# Patient Record
Sex: Female | Born: 1965
Health system: Southern US, Community
[De-identification: ages and names within clinical notes are randomized; demographics above are authoritative.]

## PROBLEM LIST (undated history)

## (undated) DIAGNOSIS — J45990 Exercise induced bronchospasm: Secondary | ICD-10-CM

## (undated) DIAGNOSIS — E7800 Pure hypercholesterolemia, unspecified: Secondary | ICD-10-CM

## (undated) DIAGNOSIS — M79604 Pain in right leg: Secondary | ICD-10-CM

## (undated) DIAGNOSIS — D649 Anemia, unspecified: Secondary | ICD-10-CM

## (undated) DIAGNOSIS — K219 Gastro-esophageal reflux disease without esophagitis: Secondary | ICD-10-CM

## (undated) DIAGNOSIS — N979 Female infertility, unspecified: Secondary | ICD-10-CM

## (undated) DIAGNOSIS — E559 Vitamin D deficiency, unspecified: Secondary | ICD-10-CM

## (undated) DIAGNOSIS — M549 Dorsalgia, unspecified: Secondary | ICD-10-CM

## (undated) HISTORY — DX: Pure hypercholesterolemia, unspecified: E78.00

## (undated) HISTORY — DX: Vitamin D deficiency, unspecified: E55.9

## (undated) HISTORY — DX: Gastro-esophageal reflux disease without esophagitis: K21.9

## (undated) HISTORY — DX: Anemia, unspecified: D64.9

## (undated) HISTORY — DX: Pain in right leg: M79.604

## (undated) HISTORY — DX: Dorsalgia, unspecified: M54.9

## (undated) HISTORY — DX: Female infertility, unspecified: N97.9

## (undated) HISTORY — PX: LAPAROSCOPY: SHX197

---

## 1998-10-12 ENCOUNTER — Other Ambulatory Visit: Admission: RE | Admit: 1998-10-12 | Discharge: 1998-10-12 | Payer: Self-pay | Admitting: *Deleted

## 2000-01-13 ENCOUNTER — Other Ambulatory Visit: Admission: RE | Admit: 2000-01-13 | Discharge: 2000-01-13 | Payer: Self-pay | Admitting: Gynecology

## 2000-10-25 ENCOUNTER — Other Ambulatory Visit: Admission: RE | Admit: 2000-10-25 | Discharge: 2000-10-25 | Payer: Self-pay | Admitting: *Deleted

## 2001-10-22 ENCOUNTER — Other Ambulatory Visit: Admission: RE | Admit: 2001-10-22 | Discharge: 2001-10-22 | Payer: Self-pay | Admitting: Obstetrics and Gynecology

## 2001-10-22 ENCOUNTER — Other Ambulatory Visit: Admission: RE | Admit: 2001-10-22 | Discharge: 2001-10-22 | Payer: Self-pay | Admitting: *Deleted

## 2001-11-29 ENCOUNTER — Ambulatory Visit (HOSPITAL_COMMUNITY): Admission: RE | Admit: 2001-11-29 | Discharge: 2001-11-29 | Payer: Self-pay | Admitting: Obstetrics and Gynecology

## 2001-11-29 ENCOUNTER — Encounter: Payer: Self-pay | Admitting: Obstetrics and Gynecology

## 2001-12-20 ENCOUNTER — Ambulatory Visit (HOSPITAL_COMMUNITY): Admission: RE | Admit: 2001-12-20 | Discharge: 2001-12-20 | Payer: Self-pay | Admitting: Obstetrics and Gynecology

## 2001-12-20 ENCOUNTER — Encounter: Payer: Self-pay | Admitting: Obstetrics and Gynecology

## 2002-03-03 ENCOUNTER — Encounter: Payer: Self-pay | Admitting: Obstetrics and Gynecology

## 2002-03-03 ENCOUNTER — Ambulatory Visit (HOSPITAL_COMMUNITY): Admission: RE | Admit: 2002-03-03 | Discharge: 2002-03-03 | Payer: Self-pay | Admitting: Obstetrics and Gynecology

## 2002-03-21 ENCOUNTER — Ambulatory Visit (HOSPITAL_COMMUNITY): Admission: RE | Admit: 2002-03-21 | Discharge: 2002-03-21 | Payer: Self-pay | Admitting: Obstetrics and Gynecology

## 2002-03-21 ENCOUNTER — Encounter: Payer: Self-pay | Admitting: Obstetrics and Gynecology

## 2002-03-25 ENCOUNTER — Encounter: Admission: RE | Admit: 2002-03-25 | Discharge: 2002-03-25 | Payer: Self-pay | Admitting: Obstetrics and Gynecology

## 2002-04-21 ENCOUNTER — Ambulatory Visit (HOSPITAL_COMMUNITY): Admission: RE | Admit: 2002-04-21 | Discharge: 2002-04-21 | Payer: Self-pay | Admitting: Obstetrics and Gynecology

## 2002-04-21 ENCOUNTER — Encounter: Payer: Self-pay | Admitting: Obstetrics and Gynecology

## 2002-05-05 ENCOUNTER — Ambulatory Visit (HOSPITAL_COMMUNITY): Admission: RE | Admit: 2002-05-05 | Discharge: 2002-05-05 | Payer: Self-pay | Admitting: Obstetrics and Gynecology

## 2002-05-05 ENCOUNTER — Encounter: Payer: Self-pay | Admitting: Obstetrics and Gynecology

## 2002-05-12 ENCOUNTER — Inpatient Hospital Stay (HOSPITAL_COMMUNITY): Admission: AD | Admit: 2002-05-12 | Discharge: 2002-05-12 | Payer: Self-pay | Admitting: Obstetrics and Gynecology

## 2002-05-15 ENCOUNTER — Inpatient Hospital Stay (HOSPITAL_COMMUNITY): Admission: AD | Admit: 2002-05-15 | Discharge: 2002-05-19 | Payer: Self-pay | Admitting: Obstetrics and Gynecology

## 2002-10-02 ENCOUNTER — Other Ambulatory Visit: Admission: RE | Admit: 2002-10-02 | Discharge: 2002-10-02 | Payer: Self-pay | Admitting: Obstetrics and Gynecology

## 2002-12-05 ENCOUNTER — Emergency Department (HOSPITAL_COMMUNITY): Admission: AD | Admit: 2002-12-05 | Discharge: 2002-12-05 | Payer: Self-pay | Admitting: Family Medicine

## 2003-11-04 ENCOUNTER — Other Ambulatory Visit: Admission: RE | Admit: 2003-11-04 | Discharge: 2003-11-04 | Payer: Self-pay | Admitting: Obstetrics and Gynecology

## 2004-02-08 ENCOUNTER — Inpatient Hospital Stay (HOSPITAL_COMMUNITY): Admission: AD | Admit: 2004-02-08 | Discharge: 2004-02-08 | Payer: Self-pay | Admitting: Obstetrics and Gynecology

## 2004-03-29 ENCOUNTER — Ambulatory Visit (HOSPITAL_COMMUNITY): Admission: RE | Admit: 2004-03-29 | Discharge: 2004-03-29 | Payer: Self-pay | Admitting: Obstetrics and Gynecology

## 2004-04-18 ENCOUNTER — Ambulatory Visit (HOSPITAL_COMMUNITY): Admission: RE | Admit: 2004-04-18 | Discharge: 2004-04-18 | Payer: Self-pay | Admitting: Obstetrics and Gynecology

## 2004-05-24 ENCOUNTER — Ambulatory Visit (HOSPITAL_COMMUNITY): Admission: RE | Admit: 2004-05-24 | Discharge: 2004-05-24 | Payer: Self-pay | Admitting: Obstetrics and Gynecology

## 2004-07-11 ENCOUNTER — Ambulatory Visit (HOSPITAL_COMMUNITY): Admission: RE | Admit: 2004-07-11 | Discharge: 2004-07-11 | Payer: Self-pay | Admitting: Obstetrics and Gynecology

## 2004-08-03 ENCOUNTER — Ambulatory Visit (HOSPITAL_COMMUNITY): Admission: RE | Admit: 2004-08-03 | Discharge: 2004-08-03 | Payer: Self-pay | Admitting: Obstetrics and Gynecology

## 2004-08-03 ENCOUNTER — Inpatient Hospital Stay (HOSPITAL_COMMUNITY): Admission: AD | Admit: 2004-08-03 | Discharge: 2004-08-08 | Payer: Self-pay | Admitting: Obstetrics and Gynecology

## 2004-08-05 ENCOUNTER — Encounter (INDEPENDENT_AMBULATORY_CARE_PROVIDER_SITE_OTHER): Payer: Self-pay | Admitting: Specialist

## 2005-02-01 ENCOUNTER — Other Ambulatory Visit: Admission: RE | Admit: 2005-02-01 | Discharge: 2005-02-01 | Payer: Self-pay | Admitting: Obstetrics and Gynecology

## 2005-05-17 ENCOUNTER — Encounter: Admission: RE | Admit: 2005-05-17 | Discharge: 2005-05-17 | Payer: Self-pay | Admitting: Obstetrics and Gynecology

## 2005-06-05 ENCOUNTER — Encounter: Admission: RE | Admit: 2005-06-05 | Discharge: 2005-06-05 | Payer: Self-pay | Admitting: Obstetrics and Gynecology

## 2005-09-27 ENCOUNTER — Emergency Department (HOSPITAL_COMMUNITY): Admission: EM | Admit: 2005-09-27 | Discharge: 2005-09-27 | Payer: Self-pay | Admitting: Emergency Medicine

## 2006-06-13 ENCOUNTER — Other Ambulatory Visit: Admission: RE | Admit: 2006-06-13 | Discharge: 2006-06-13 | Payer: Self-pay | Admitting: *Deleted

## 2006-11-07 ENCOUNTER — Encounter: Admission: RE | Admit: 2006-11-07 | Discharge: 2006-11-07 | Payer: Self-pay | Admitting: Obstetrics and Gynecology

## 2007-05-02 ENCOUNTER — Encounter: Admission: RE | Admit: 2007-05-02 | Discharge: 2007-05-02 | Payer: Self-pay | Admitting: *Deleted

## 2007-11-21 ENCOUNTER — Encounter: Admission: RE | Admit: 2007-11-21 | Discharge: 2007-11-21 | Payer: Self-pay | Admitting: Family Medicine

## 2009-01-01 ENCOUNTER — Encounter: Admission: RE | Admit: 2009-01-01 | Discharge: 2009-01-01 | Payer: Self-pay | Admitting: Obstetrics and Gynecology

## 2009-05-07 ENCOUNTER — Ambulatory Visit: Payer: Self-pay | Admitting: Sports Medicine

## 2009-05-07 DIAGNOSIS — M7711 Lateral epicondylitis, right elbow: Secondary | ICD-10-CM | POA: Insufficient documentation

## 2009-05-07 DIAGNOSIS — M25529 Pain in unspecified elbow: Secondary | ICD-10-CM | POA: Insufficient documentation

## 2009-06-07 ENCOUNTER — Ambulatory Visit: Payer: Self-pay | Admitting: Sports Medicine

## 2010-01-25 ENCOUNTER — Encounter
Admission: RE | Admit: 2010-01-25 | Discharge: 2010-01-25 | Payer: Self-pay | Source: Home / Self Care | Attending: Family Medicine | Admitting: Family Medicine

## 2010-02-13 ENCOUNTER — Encounter: Payer: Self-pay | Admitting: Obstetrics and Gynecology

## 2010-02-22 NOTE — Assessment & Plan Note (Signed)
Summary: NP,TENNIS ELBOW,MC   Vital Signs:  Patient profile:   45 year old female Height:      69 inches Weight:      190 pounds BMI:     28.16 BP sitting:   117 / 82  Vitals Entered By: Lillia Pauls CMA (May 07, 2009 10:29 AM)  History of Present Illness: Sharon [name] was playing a lot of aggressive tennis playing sometimes as much as 5x per wk Rt elbow painful 6 wks ago  rested 1 weeks and had racquet change 3 wks later came back and pain became more severe has not played since then no numbness initially hurt to do any lifting now less so  Allergies (verified): No Known Drug Allergies  Physical Exam  General:  Well-developed,well-nourished,in no acute distress; alert,appropriate and cooperative throughout examination Msk:  RT lat epidcondyle shows direct tenderness near tip testingof ERCB is very painful testing of gen wrist ext mildy painful supination mildly painful holding book str out is mildly painful no swelling or discoloration nl ROM  MSK [place] small avul fx at tip small tear under ext mm no neovessels some edema in ext mm group  images saved   Impression & Recommendations:  Problem # 1:  ELBOW PAIN, RIGHT (ICD-719.42)  Orders: Pneumatic Arm Band ([id number]) [place] LIMITED ([id number])  begin rehab exerc see isnt icing brace  Problem # 2:  LATERAL EPICONDYLITIS, RIGHT (ICD-726.32)  This is clearly demonstrated on [place] With small tear will start on NTG protocol  reck monthly until healed  Orders: [place] LIMITED ([id number])  Complete Medication List: 1)  Nitroglycerin 0.2 Mg/hr Pt24 (Nitroglycerin) .... 1/4 patch daily as directed to rt elbow  Patient Instructions: 1)  use patches daily 2)  do quick ice massage after playing - for 5 mins 3)  exercises 4)  wrost curls - down slow / up quick 5)  wrist rolls over slow/ back quick 6)  3 sets of 15 w 1 lb 7)  do several times a day 8)  OK to hit - at first 2 weeks only shots that don't hurt 9)  use the elbow strap  - does not have to be tight at least first 2 weeks 10)  reck 1 month Prescriptions: NITROGLYCERIN 0.2 MG/HR PT24 (NITROGLYCERIN) 1/4 patch daily as directed to RT elbow  #30 x 2   Entered and Authorized by:   Enid Baas MD   Signed by:   Enid Baas MD on 05/07/2009   Method used:   Electronically to        [facility] [phone]* (retail)       [address]       [place], [place]  [address]       Ph: [phone] or [phone]       Fax: [phone]   RxID:   [id number]

## 2010-02-22 NOTE — Assessment & Plan Note (Signed)
Summary: f/u,mc   Vital Signs:  Patient profile:   45 year old female BP sitting:   110 / 67  Vitals Entered By: Lillia Pauls CMA (Jun 07, 2009 10:13 AM)  History of Present Illness: Following all instructions as outlined  feels elbow is 85 to 90% better went back to playing social tennis wk to 10 days p NTG after 1 wk of that able to play competitive wout any pain while playing  no sideeffects from NTG  here for REck  Allergies: No Known Drug Allergies  Physical Exam  General:  Well-developed,well-nourished,in no acute distress; alert,appropriate and cooperative throughout examination Msk:  minimal TTP at tip of epicoindyle on RT no real pain w resited ext of fingers 2,3 and 4 no pain w resisted elbow extension full supination adn pronation x resistance w no pain  able to hold book in full extension   Impression & Recommendations:  Problem # 1:  ELBOW PAIN, RIGHT (ICD-719.42)  minimal pain now  some tightness and mild soreness after playing hard but does not last  Orders: [place] LIMITED ([id number])  Problem # 2:  LATERAL EPICONDYLITIS, RIGHT (ICD-726.32)  clinically this is nearly resolved  on scanning the gaps and edema have nearly resolved as well  finish full 3 mos protocol on NTG  keep up exercies  use strap until fall  REck 8 wks  Orders: [place] LIMITED ([id number])  Complete Medication List: 1)  Nitroglycerin 0.2 Mg/hr Pt24 (Nitroglycerin) .... 1/4 patch daily as directed to rt elbow

## 2010-06-10 NOTE — Discharge Summary (Signed)
Sharon, [name]                 ACCOUNT NO.:  [id number]   MEDICAL RECORD NO.:  [id number]           PATIENT TYPE:   LOCATION:  9119                           FACILITY:   PHYSICIAN:  Janine Limbo, M.D.DATE OF BIRTH:  08/21/1965   DATE OF ADMISSION:  08/03/2004  DATE OF DISCHARGE:  08/08/2004                                 DISCHARGE SUMMARY   ADMISSION DIAGNOSES:  1.  Intrauterine twin pregnancy at 37 weeks.  2.  Discordant growth.  3.  Early labor.  4.  Unfavorable cervix.   DISCHARGE DIAGNOSES:  1.  Twin gestation at 11 weeks.  2.  Discordant growth.  3.  Failure to progress in labor.  4.  Anemia without hemodynamic instability.   PROCEDURES:  1.  Primary low transverse cesarean section.  2.  Epidural anesthesia.   HOSPITAL COURSE:  Sharon [name] is a 45 year old, gravida 2, para 1 at 61 weeks  with twin gestation who was admitted on 08/03/2004 for evaluation of  discordant growth on ultrasound.  The patient was also noted to be  contracting.  The first baby was vertex.  The second baby was transverse.  The decision was made to offer the patient a C section for delivery.  The  patient opted for a trial of labor.  Low dose Pitocin was begun.  The  patient's pregnancy had been remarkable for:  1. Intrauterine twin pregnancy  at 37 weeks.  2. Discordant growth with Twin B.  3. Unfavorable cervix.  4.  History of insulin-dependent gestational diabetes mellitus.  5. In vitro  fertilization pregnancy.  6. Advanced maternal age.  7. Group B strep  negative.  Pitocin was begun and artificial rupture of membranes was  accomplished with clear fluid noted.  Epidural was placed.  The patient  progressed to 7-8 cm, 90% vertex at a 0 to +1 station.  Over a greater than  two hours period, she did not progress past 7 cm.  The decision was made to  proceed with cesarean section.  This was performed by Maris Berger. Haygood,  M.D. under epidural anesthesia.  The findings were viable  twin gestation.  Baby A was Sharon [name], weight 6 pounds 2 ounces.  Apgar's were 8 and 9.  Baby B  was Sharon [name], weight 5 pounds 6 ounces, Apgar's were 8 and 9.  There was a  small fibroid noted.  The infants were taken to the Full Term Nursery.  The  mother was taken to the recovery room in good condition.  Estimated blood  loss was 1250 cubic centimeters.  By postoperative day #1, the patient was  doing well.  She denied syncope or any dizziness.  She was breastfeeding.  She was having some significant itching.  Her orthostatic's were stable.  Her physical exam was within normal limits.  She did have 1+ edema in her  lower extremities.  Hemoglobin was 8.5, down from 10.5, white blood cell  count 12, and platelet count was 133.  The patient was placed on iron.  The  patient was working on bottle feeding and breast feeding through  the rest of  her hospital stay.  By postoperative day #3, her Jackson-Pratt drain had  been removed on postoperative day #2 without any difficulty after minimal  drainage.  By postoperative day #3, she was doing well.  She did have some  slight upper abdominal fullness but was passing gas and did have positive  bowel sounds.  Her incision was clean, dry, and intact with subcuticular  sutures.  She was up ad lib without any difficulty.  She was deemed to  receive full benefit from her hospital stay and was discharged home.   CONDITION ON DISCHARGE:  Stable.   DISCHARGE INSTRUCTIONS:  Per [facility] handout.   DISCHARGE MEDICATIONS:  Motrin 600 mg p.o. q.6h. p.r.n. pain, Tylox 1-2 p.o.  q.3-4h. p.r.n. pain, Citracal, prenatal vitamin one p.o. q. Day.  The  patient will also continue Feosol at home one p.o. b.i.d.   DISCHARGE FOLLOW UP:  Will occur in six weeks at [facility] or  p.r.n.   Dictated by Renaldo Reel. Emilee Hero, C.N.M. for Janine Limbo, M.D.       VLL/MEDQ  D:  08/08/2004  T:  08/08/2004  Job:  [id number]

## 2010-06-10 NOTE — H&P (Signed)
NAMEANDERSEN, MCKIVER                             ACCOUNT NO.:  [id number]   MEDICAL RECORD NO.:  [id number]                   PATIENT TYPE:   LOCATION:                                       [facility]   PHYSICIAN:  Janine Limbo, M.D.            DATE OF BIRTH:  02-21-1965   DATE OF ADMISSION:  05/15/2002  DATE OF DISCHARGE:                                HISTORY & PHYSICAL   HISTORY OF PRESENT ILLNESS:  The patient is a 45 year old gravida 1 para 0  at 74 and four-sevenths weeks who presents this evening for induction  secondary to insulin-dependent gestational diabetes and possible LGA.  Present pregnancy has been remarkable for:  1. History of infertility with patient's conception with IVF.  2. Advanced maternal age with amniocentesis within normal limits.  3. Gestational diabetes, insulin dependent.  4. History of asthma.  5. Initial twin gestation with loss of one twin in the first trimester.   PRENATAL LABORATORY DATA:  Blood type O positive, Rh antibody negative.  VDRL nonreactive.  Rubella titer positive.  Hepatitis B surface antigen  negative.  Sickle cell test negative.  Cystic fibrosis testing negative.  Pap normal.  Glucose challenge was elevated.  Hemoglobin upon entering the  practices was 12.7; it was 12.4 at 29 weeks.  Group B strep culture was  negative at 36 weeks.  EDC of May 18, 2002 was established by embryo  transfer date.   HISTORY OF PRESENT PREGNANCY:  The patient entered care at approximately 10  weeks.  She had had in vitro fertilization to accomplish pregnancy.  At her  first visit she had an ultrasound secondary to no fetal heart tones.  There  was one viable twin noted and one nonviable twin noted.  The patient  interested in amniocentesis.  This was done on November 7, without  complication.  Findings were a viable female.  She had another ultrasound at  approximately 18 weeks which showed normal growth and development.  She did  have some  reflux and back pain during her pregnancy.  She also was on  prednisone at approximately 25 weeks for her asthma.  She was placed on a Z  pack and Tussionex at 28 weeks for an upper respiratory infection.  She had  another ultrasound at approximately 29 weeks that showed normal growth and  development.  She had an elevated one-hour GTT and an abnormal three-hour  GTT.  This was following a prednisone course.  The patient remained with  fasting blood sugars in the 98-111 range and occasional two-hour PCs greater  than 120.  She was placed on Humulin at 34 weeks, primarily 10 units NPH at  h.s.  She began NSTs twice weekly.  She had an ultrasound at 31 weeks that  showed estimated fetal weight greater than the 95th percentile.  Humulin was  increased to 12 units NPH at  35 weeks secondary to elevated two-hour PCs.  This was increased to 14 units at 38 weeks.  She had another ultrasound at  38 weeks which showed growth in the greater than the 90th percentile.  Induction was scheduled for April 22.  She had reactive NSTs twice weekly.  She did have one spontaneous CST on April 19 in response to a questionable  deceleration associated with contraction at the office.  Options of  C section versus induction secondary to elevated fetal weight were reviewed  with the patient by Dr. Pennie Rushing.  The patient at that point did not wish to  proceed with C section.  On ultrasound at 36 weeks she had estimated fetal  weight in the 75th to 90th percentile with low-normal fluid.  Sugars  stabilized and the patient remained on 14 units NPH at bedtime.  The patient  was then reiterated to be scheduled for induction on April 22 in the  evening.  Her cervix has been closed and 75%.   OBSTETRICAL HISTORY:  The patient is a primigravida.   MEDICAL HISTORY:  The patient has been evaluated and treated for infertility  since 1999.  Conception did occur by in vitro fertilization with this  pregnancy.  The rest of her  medical history is unremarkable except for  exercise-induced asthma for which she occasionally uses Advair.  Surgical  history includes laparoscopy in the past for workup of infertility, and  wisdom teeth removal.  She reports the usual childhood illnesses.   ALLERGIES:  No known medication allergies.   FAMILY HISTORY:  Her sister had childhood anemia.  Her cousin had a brain  aneurysm.  Maternal grandmother had insulin-dependent diabetes.  Paternal  aunt had non-insulin-dependent diabetes.  Paternal grandfather had some type  of cancer.  Paternal grandmother had a stroke.   GENETIC HISTORY:  Remarkable for the patient being 36 at the time of  delivery.  Her cousin also had an extra digit.  The father-of-the-baby's  cousin had Downs syndrome.   SOCIAL HISTORY:  The patient is married to the father of the baby.  He is  involved and supportive.  His name is Lucill Mauck.  The patient is  African-American, of the [address] faith.  She has been followed by the  physician service at [facility].  She denies any alcohol, drug, or  tobacco use during this pregnancy.  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  Her husband is a Development worker, community.   PHYSICAL EXAMINATION:  VITAL SIGNS:  Stable; the patient is afebrile.  HEENT:  Within normal limits.  LUNGS:  Bilateral breath sounds are clear.  HEART:  Regular rate and rhythm without murmur.  BREASTS:  Soft and nontender.  ABDOMEN:  Fundal height is approximately 38 cm.  Estimated fetal weight is 8  pounds.  Uterine contractions have been occasional and mild.  PELVIC:  Cervical exam on last check was closed, 75%, -1 to -2.  MONITOR DATA:  Fetal heart rate is reassuring.  She had an NST today in the  office.  EXTREMITIES:  Deep tendon reflexes are 2+ without clonus.  There is a trace  edema noted.   IMPRESSION:  1. Intrauterine pregnancy at 25 and four-sevenths weeks. 2. Insulin-dependent gestational diabetes.  3. Advanced maternal  age with normal amniocentesis.  4. Infertility with in vitro fertilization.  5. Asthma.   PLAN:  1. Admit to birthing suite per consult with Dr. Marline Backbone as attending     physician.  2. Routine physician orders.  3. Dr. Stefano Gaul will determine mode of management of her gestational     diabetes which may well include the Glucomander and close observation of     CBGs.     Renaldo Reel Emilee Hero, C.N.M.                   Janine Limbo, M.D.    VLL/MEDQ  D:  05/15/2002  T:  05/15/2002  Job:  [id number]

## 2010-06-10 NOTE — H&P (Signed)
   Sharon [name], Sharon [name]                             ACCOUNT NO.:  [id number]   MEDICAL RECORD NO.:  [id number]                   PATIENT TYPE:   LOCATION:                                       [facility]   PHYSICIAN:  Janine Limbo, M.D.            DATE OF BIRTH:  08-Jun-1965   DATE OF ADMISSION:  05/15/2002  DATE OF DISCHARGE:                                HISTORY & PHYSICAL   HISTORY OF PRESENT ILLNESS:  The patient is a 45 year old gravida 1 para 0  at 76 and four-sevenths week who presents this evening for induction  secondary to insulin-dependent gestational diabetes.  Her pregnancy has been  remarkable for:  1. Infertility with conception by in vitro fertilization.  2. Advanced maternal age with   Dictation ended at this point     Chip Boer L. Emilee Hero, C.N.M.                   Janine Limbo, M.D.    VLL/MEDQ  D:  05/15/2002  T:  05/15/2002  Job:  [id number]

## 2010-06-10 NOTE — H&P (Signed)
Sharon [name], Sharon [name]                 ACCOUNT NO.:  [id number]   MEDICAL RECORD NO.:  [id number]          PATIENT TYPE:  INP   LOCATION:                                [facility]:  [facility]   PHYSICIAN:  Hal Morales, M.D.DATE OF BIRTH:  01/17/1966   DATE OF ADMISSION:  08/03/2004  DATE OF DISCHARGE:                                HISTORY & PHYSICAL   HISTORY OF PRESENT ILLNESS:  Ms. Sharon [name] is a 45 year old married African-  American female, gravida 2, para 1-0-0-1 at [redacted] weeks gestation with a twin  pregnancy for evaluation of discordant growth on ultrasound by non-stress  test, and it was then noted patient was contracting every 3-5 minutes.  She  denies leaking or bleeding.  Reports positive fetal movement.  Denies signs  and symptoms of PIH.  Her pregnancy has been followed by the [facility] OB/GYN M.D. service, and has been remarkable for (1) twins, (2)  history of insulin-dependent gestational diabetes, (3) this pregnancy was  conceived by in vitro fertilization, (4) advanced maternal age, (5) group B  strep negative.   PRENATAL LABORATORIES:  Her prenatal labs were collected on February 01, 2004.  Hemoglobin 12.1, hematocrit 36.7, platelets 239,000.  Blood type O positive.  Antibody negative.  Sickle cell trait negative.  RPR nonreactive.  Rubella  titer is positive.  Hepatitis B surface antigen negative.  HIV negative from  October of 2005.  Pap smear within normal limits in October 2005.  Gonorrhea  and Chlamydia negative in October 2005.  Hemoglobin A1C was 5.7.  One hour  Glucola from May 25, 2003 was 106, and RPR at that time was nonreactive.  A  culture of the vaginal tract for group B strep was negative.   HISTORY OF PRESENT ILLNESS:  The patient presented for care at [facility] on February 01, 2004 at 10 and 3/7ths weeks gestation with a twin  pregnancy that was conceived through in vitro fertilization.  The best [facility]  was determined to be August 26, 2004.  The  patient opted for first trimester  screening at [facility] which gave twin A increased Down syndrome risk  of 1 in 37, and twin B increased Down syndrome risk of 1 in 7.  Amniocentesis was recommended to the patient.  She proceeded with anatomy  ultrasound at [redacted] weeks gestation that showed no soft markers for Down  syndrome, and she declined the amniocentesis and the serum AFP.  Pregnancy  ultrasonography at [redacted] weeks gestation showed normal growth and normal fluid  for both twins.  EFW was in the 50th to 75th percentile.  The patient was  treated for epigastric pain with Protonix at [redacted] weeks gestation.  Ultrasonography at [redacted] weeks gestation showed normal growth and normal fluid,  cervix of 4.4 cm.  Twin A with pyelectasis.  Ultrasonography at [redacted] weeks  gestation showed normal growth and normal fluid for both twins, cervix of  3.6 cm.  The rest of her pregnancy was unremarkable.   OBSTETRICAL HISTORY:  She is a gravida 2, para 1-0-0-1.  In  April of 2004,  the patient had a vaginal delivery of a female infant weighing 7 pounds, 12  ounces, at 61 and 2/7ths weeks gestation after 12 hours of labor.  She had  an epidural for anesthesia.  The infant's name was Sharon [name].  The patient had  insulin-dependent gestational diabetes mellitus with that pregnancy.   MEDICAL HISTORY:  1.  She has no medication allergies.  2.  She experienced menarche at the age of 65 with 22-24 day cycles, lasting      4-5 days.  3.  She reports having had the usual childhood illnesses.  4.  She has a history of asthma.   SURGICAL HISTORY:  Negative.   FAMILY MEDICAL HISTORY:  Negative.   GENETIC HISTORY:  Remarkable for the patient's cousin with polydactyly, and  father of the baby's cousin with Down syndrome.  The patient is age 56 at  the time of delivery.   SOCIAL HISTORY:  The patient is married to [place]  He is from [place].  He  is involved and supportive.  They are of the [facility] faith.  The patient  has  16 years of education, and is a full-time homemaker.  Father of the baby is  employed full time as a Proofreader.  They deny any tobacco smoking or  illicit drug use with the pregnancy.   OBJECTIVE DATA:  VITAL SIGNS:  Stable.  She is afebrile.  HEENT:  Grossly within normal limits.  CHEST:  Clear to auscultation.  HEART:  Regular rate and rhythm.  ABDOMEN:  Gravid in contour.  Fetal heart rates are reassuring with positive  accelerations, and negative CST x2.  Contractions every 3-5 minutes, which  are mild.  PELVIC:  Cervix is 1 cm, 50%, vertex -2, and posterior.  EXTREMITIES:  Remarkable for 1+ edema.  Ultrasonography shows the first baby  is vertex.  Twin B is transverse with vertex in the left lower quadrant.  Estimated fetal weight of A is 3400 gm.  Estimated fetal weight of B is 2700  gm.   ASSESSMENT:  1.  Intrauterine pregnancy at 37 weeks with twins.  2.  Twin discordant growth.  3.  Unfavorable cervix.   PLAN:  1.  Admit the patient to birthing suites per Dr. Pennie Rushing.  2.  Routine M.D. orders.  3.  The patient was given the option of cesarean section for delivery, but      opts for a trial at vaginal delivery, and understands that twin B may      require cesarean section for delivery.  4.  Will plan low dose Pitocin for cervical ripening, and Pitocin induction      in the morning.       KS/MEDQ  D:  08/04/2004  T:  08/04/2004  Job:  [id number]

## 2010-06-10 NOTE — Op Note (Signed)
NAMECHRISTEENA, KROGH                 ACCOUNT NO.:  [id number]   MEDICAL RECORD NO.:  [id number]          PATIENT TYPE:  INP   LOCATION:  9119                          FACILITY:  [facility]   PHYSICIAN:  Hal Morales, M.D.DATE OF BIRTH:  1965/07/31   DATE OF PROCEDURE:  08/05/2004  DATE OF DISCHARGE:                                 OPERATIVE REPORT   PREOPERATIVE DIAGNOSES:  1.  Twin gestation at 56 weeks.  2.  Discordant twin growth.  3.  Failure to progress in labor.   POSTOPERATIVE DIAGNOSES:  1.  Twin gestation at 7 weeks.  2.  Discordant twin growth.  3.  Failure to progress in labor.   OPERATION:  Primary low transverse cesarean section.   SURGEON:  Hal Morales, M.D.   FIRST ASSISTANT:  Erin Sons, C.N.M.   ANESTHESIA:  Epidural.   ESTIMATED BLOOD LOSS:  1250 mL.   COMPLICATIONS:  None.   FINDINGS:  The patient was delivered of two female infants.  Baby A, [name],  seven 6 pounds 2 ounces, with Apgars of 8 and 9 at one and minutes,  respectively.  Baby B, [name], weighed 5 pounds 6 ounces, with Apgars of 8  and 9 at one and five minutes, respectively.  The uterus contained a small  uterine fibroid.  The ovaries and tubes appeared within normal limits for  the gravid state.   PROCEDURE:  The patient was taken to the operating room after appropriate  identification and after a discussion concerning the risks of cesarean  section versus the benefits.  A discussion was held concerning the risks of  anesthesia, bleeding, infection and damage to adjacent organs.  The patient  gave consent to proceed.  She was placed on the operating table and the  labor epidural block to the level for surgical anesthesia.  Her Foley  catheter was already in place.  The abdomen was prepped with multiple layers  of Betadine and draped as a sterile field.  The suprapubic region was  infiltrated with 20 mL of 0.25% Marcaine.  A suprapubic incision was made in  the abdomen opened in  layers.  The peritoneum was entered and the bladder  blade placed.  The uterus was incised approximately 2 cm above the  uterovesical fold and that incision taken laterally on either side.  Baby A  was delivered from the occiput transverse position with the nares and  pharynx being suctioned and the cord being clamped and cut.  She was handed  off to the awaiting pediatricians.  The second infant was delivered as a  footling breech and after having the nares and pharynx suctioned and the  cord clamped and cut was handed off to the awaiting pediatricians.  The  appropriate cord blood was drawn including cord blood for banking, which the  patient had requested.  The placenta was noted to have separated from the  uterus and was removed from the operative field and sent to pathology.  Uterine incision was closed with a running interlocking suture of 0 Vicryl.  An imbricating suture of 0 Vicryl  was then placed.  Several sutures of 0  Vicryl in a figure-of-eight fashion were used to achieve adequate  hemostasis.  Copious irrigation was carried out and a single suture used to  reapproximate the bladder flap.  The abdominal peritoneum was closed with a  running suture of 2-0 Vicryl.  The rectus muscles were reapproximated in the  midline with a figure-of-eight suture of 2-0 Vicryl.  The rectus muscles  were made hemostatic with Bovie cautery and irrigated.  The rectus fascia  was closed with a running suture of 0 Vicryl, then reinforced on either side  of midline with figure-of-eight sutures of 0 Vicryl.  The subcutaneous  tissue was copiously irrigated and made hemostatic with Bovie cautery.  A 7  mm Jackson-Pratt drain was placed in the subcutaneous space and exited  through a stab wound in the left lower quadrant.  It was sewn in with a  suture of 0 silk.  The skin incision was closed with a subcuticular suture  of 3-0 Monocryl.  A sterile dressing was applied.  The patient was taken  from the  operating room to the recovery room in satisfactory condition  having tolerated the procedure well, with sponge and instrument counts  correct.  The infants went to full-term nursery.       VPH/MEDQ  D:  08/05/2004  T:  08/05/2004  Job:  [id number]

## 2010-06-10 NOTE — Op Note (Signed)
   [name], [name]                             ACCOUNT NO.:  [id number]   MEDICAL RECORD NO.:  [id number]                   PATIENT TYPE:  OUT   LOCATION:  ULT                                  [facility]  [facility]   PHYSICIAN:  Hal Morales, M.D.             DATE OF BIRTH:  Jul 17, 1965   DATE OF PROCEDURE:  11/29/2001  DATE OF DISCHARGE:  11/29/2001                                 OPERATIVE REPORT   PREOPERATIVE DIAGNOSES:  Intrauterine pregnancy at [redacted] weeks gestation.  Maternal age of 67.   POSTOPERATIVE DIAGNOSES:  Intrauterine pregnancy at [redacted] weeks gestation.  Maternal age of 2.   OPERATION:  Genetic amniocentesis.   ANESTHESIA:  Local.   ESTIMATED BLOOD LOSS:  Less than 10 cc.   COMPLICATIONS:  None.   FINDINGS:  Amniotic fluid volume was normal.  There was a diminutive second  sac, presumably associated with a twin, which had undergone IUFB, documented  at approximately 10 weeks' gestation.   PROCEDURE:  The patient was met in the ultrasound suite where initial  ultrasound evaluation for placental placement and fluid volume had been  undertaken.  A fluid pocket which corresponded to the midline, approximately  5 cm above the symphysis pubis was marked and noted to be free of fetal  parts and free of cord.  This area was cleaned with multiple layers of  Betadine and infiltrated with 1% Xylocaine.  An #20-gauge Ultravue needle  was used to make a single pass into the visualized fluid pocket, and 5 cc of  clear amniotic fluid were drawn into the first syringe.  The syringes were  changed, and a second syringe containing 15 cc of amniotic fluid was drawn.  The amniocentesis site was documented and the needle removed.  The  postamniocentesis heart rate was within normal limits.  The patient did not  have any immediate complications from her procedure and was discharged home  with printed instructions from central [place].  The amniotic fluid  was sent to [facility] for evaluation.                                               Hal Morales, M.D.    VPH/MEDQ  D:  11/29/2001  T:  12/01/2001  Job:  [id number]

## 2011-02-23 ENCOUNTER — Other Ambulatory Visit: Payer: Self-pay | Admitting: Family Medicine

## 2011-02-23 DIAGNOSIS — Z1231 Encounter for screening mammogram for malignant neoplasm of breast: Secondary | ICD-10-CM

## 2011-03-06 ENCOUNTER — Ambulatory Visit
Admission: RE | Admit: 2011-03-06 | Discharge: 2011-03-06 | Disposition: A | Discharge status: Home / Self Care | Payer: BC Managed Care – PPO | Source: Ambulatory Visit | Attending: Sports Medicine | Admitting: Sports Medicine

## 2011-03-06 ENCOUNTER — Ambulatory Visit (INDEPENDENT_AMBULATORY_CARE_PROVIDER_SITE_OTHER): Payer: BC Managed Care – PPO | Admitting: Sports Medicine

## 2011-03-06 VITALS — BP 128/82

## 2011-03-06 DIAGNOSIS — M545 Low back pain, unspecified: Secondary | ICD-10-CM

## 2011-03-06 DIAGNOSIS — M79605 Pain in left leg: Secondary | ICD-10-CM

## 2011-03-06 DIAGNOSIS — M5136 Other intervertebral disc degeneration, lumbar region: Secondary | ICD-10-CM | POA: Insufficient documentation

## 2011-03-06 DIAGNOSIS — M51369 Other intervertebral disc degeneration, lumbar region without mention of lumbar back pain or lower extremity pain: Secondary | ICD-10-CM | POA: Insufficient documentation

## 2011-03-06 MED ORDER — MELOXICAM 15 MG PO TABS: 15.0000 mg | ORAL_TABLET | Freq: Every day | ORAL | Status: DC

## 2011-03-06 MED ORDER — AMITRIPTYLINE HCL 25 MG PO TABS: 25.0000 mg | ORAL_TABLET | Freq: Every day | ORAL | Status: DC

## 2011-03-06 NOTE — Patient Instructions (Signed)
Please do suggested stretches 5 repetitions for 5 breaths  Walking is good for your back  Please start meloxicam 15 mg once daily in the mornings, and amitriptyline 25 mg at bedtime   Please get back x-rays done at [facility]  Make sure you warm up well for tennis  Follow up in 2 weeks  Thank you for seeing [place] today!

## 2011-03-06 NOTE — Assessment & Plan Note (Signed)
Given a series of back stretches and exercises  Plan LS spine film  meloxicam AM and amitriptyline PM  Reck in 2 wks

## 2011-03-06 NOTE — Progress Notes (Signed)
  Subjective:    Patient ID: Sharon [name], female    DOB: 02/18/1965, 46 y.o.   MRN: [id number]  HPI  Pt presents to clinic for evaluation of lt lower back pain that has been going on for at least 2 months. Pain radiates into lt lower leg, sometimes pain is sharp that radiates all the way down lt leg into toes. She feels it is aggravated by tennis.  After playing last month- had pain in low back and left leg that caused problems with sitting to drive.   Does not recall any back injury. Taking ibuprofen which is helpful for pain.   No bowel, bladder or discogenic sxs No weakness in legs or feet  Review of Systems     Objective:   Physical Exam NAD  Pain in lt lower back with back 30 deg extension, and 90 deg back flexion Also has pain with straightening up from back flexion No pain with lateral bend or rotation No ttp lumbar spine Lying prone- has pain over rt SI joint Has pain with reverse leg raise lying prone - lt leg has pain on rt SI joint, with rt leg has pain in rt SI joint Straight leg raise neg bilat SI joints move freely bilat Good strength bilat - plantar and dorsi flexion Hip abduction strong bilat Pain with knee to chest stretch on lt, but not on rt No pain with knee to opp chest bilat Both knees to chest mostly painful with lowering legs back down Good with toe and heel walking Cross over walking good         Assessment & Plan:

## 2011-03-08 ENCOUNTER — Ambulatory Visit
Admission: RE | Admit: 2011-03-08 | Discharge: 2011-03-08 | Disposition: A | Discharge status: Home / Self Care | Payer: BC Managed Care – PPO | Source: Ambulatory Visit | Attending: Family Medicine | Admitting: Family Medicine

## 2011-03-08 DIAGNOSIS — Z1231 Encounter for screening mammogram for malignant neoplasm of breast: Secondary | ICD-10-CM

## 2011-03-21 ENCOUNTER — Ambulatory Visit (INDEPENDENT_AMBULATORY_CARE_PROVIDER_SITE_OTHER): Payer: BC Managed Care – PPO | Admitting: Sports Medicine

## 2011-03-21 VITALS — BP 110/70

## 2011-03-21 DIAGNOSIS — M545 Low back pain, unspecified: Secondary | ICD-10-CM

## 2011-03-21 DIAGNOSIS — M79605 Pain in left leg: Secondary | ICD-10-CM

## 2011-03-21 NOTE — Assessment & Plan Note (Addendum)
Resolved left S1 radiculopathy. Cont rehab. Cont elavil until radicular symptoms resolved. Cont meloxicam prn. Added felt heel lift. RTC prn.

## 2011-03-21 NOTE — Progress Notes (Signed)
  Subjective:    Patient ID: Sharon [name], female    DOB: Sep 06, 1965, 46 y.o.   MRN: [id number]  HPI Sharon [name] comes in for followup of her lumbar radiculopathy with left leg pain. She had this for about 2 months. We saw HER-2 weeks ago, and started meloxicam, amitriptyline, as well as rehabilitation exercises. She comes back approximately 80% better, essentially all pain radiating down her left leg has resolved. She is very happy with the results.  Review of Systems    No fevers, chills, night sweats, weight loss, chest pain, or shortness of breath.  Social History: Non-smoker. Objective:   Physical Exam General:  Well developed, well nourished, and in no acute distress. Neuro:  Alert and oriented x3, extra-ocular muscles intact. Skin: Warm and dry, no rashes noted. Respiratory:  Not using accessory muscles, speaking in full sentences. Musculoskeletal: Back Exam: Inspection: Unremarkable Motion: Flexion 45 deg, Extension 45 deg, Side Bending to 45 deg bilaterally,  Rotation to 45 deg bilaterally SLR laying:  Negative XSLR laying: Negative Palpable tenderness: None FABER: negative Sensory change: Gross sensation intact to all lumbar and sacral dermatomes. Reflexes: 2+ at both patellar tendons, 2+ at achilles tendons, Babinski's downgoing.  Strength at foot Plantar-flexion: 5/5    Dorsi-flexion: 5/5    Eversion: 5/5   Inversion: 5/5 Leg strength Quad: 5/5   Hamstring: 5/5   Hip flexor: 5/5   Hip abductors: 5/5 Gait unremarkable.  I did independently review her lumbar spine films. She has mild multilevel degenerative disc disease, however she does have accentuated lumbar lordosis, as well as loss of height at the L5-S1 level. She does have evidence of narrowing of the neural foramina in the obliques and essentially all levels.   Assessment & Plan:

## 2011-03-21 NOTE — Patient Instructions (Addendum)
Great to see you. Glad you are doing better. Come back to see [place] in 4 weeks as needed.  Continue:  Belly press. Cat/camel. Superman. Stretching. Knee to chest.

## 2011-06-30 ENCOUNTER — Other Ambulatory Visit: Payer: Self-pay | Admitting: Sports Medicine

## 2011-12-01 ENCOUNTER — Other Ambulatory Visit: Payer: Self-pay | Admitting: *Deleted

## 2011-12-01 MED ORDER — MELOXICAM 15 MG PO TABS: 15.0000 mg | ORAL_TABLET | Freq: Every day | ORAL | Status: DC

## 2011-12-01 MED ORDER — AMITRIPTYLINE HCL 25 MG PO TABS: 25.0000 mg | ORAL_TABLET | Freq: Every day | ORAL | Status: DC

## 2011-12-01 NOTE — Progress Notes (Signed)
Refilled pt's med x1 month.  Scheduled her for f/u with Dr. [name] as she has not been seen since 02/2011.  Pt coming in for f/u 12/26/11.

## 2011-12-26 ENCOUNTER — Ambulatory Visit (INDEPENDENT_AMBULATORY_CARE_PROVIDER_SITE_OTHER): Payer: BC Managed Care – PPO | Admitting: Sports Medicine

## 2011-12-26 ENCOUNTER — Encounter: Payer: Self-pay | Admitting: Sports Medicine

## 2011-12-26 VITALS — BP 111/69 | HR 73 | Ht 69.0 in | Wt 195.0 lb

## 2011-12-26 DIAGNOSIS — M545 Low back pain, unspecified: Secondary | ICD-10-CM

## 2011-12-26 DIAGNOSIS — M79605 Pain in left leg: Secondary | ICD-10-CM

## 2011-12-26 MED ORDER — AMITRIPTYLINE HCL 25 MG PO TABS: 25.0000 mg | ORAL_TABLET | Freq: Every day | ORAL | Status: DC

## 2011-12-26 MED ORDER — MELOXICAM 15 MG PO TABS: 15.0000 mg | ORAL_TABLET | Freq: Every day | ORAL | Status: AC

## 2011-12-26 NOTE — Patient Instructions (Addendum)
Please do the following exercises  Neck posture Do a stretch once daily Isometric exercises  Knee to chest Knee to opposite shoulder Belly tucks - belly button to spine  5 x 5 breaths for all of these exercises

## 2011-12-26 NOTE — Progress Notes (Signed)
Patient ID: Sharon [name], female   DOB: 1966-01-09, 46 y.o.   MRN: [id number]  F/U LBP At first had radiation into both legs Now no radiation as long as she uses amitriptyline Also gets tingling in neck  Mobic uses only for pain Does help and does not bother her stomach as much  She continues to play a lot of tennis and in fact is planning twice today  Her low back pain settled in the midportion of her low back and is associated with tightness but the medication blocks it from radiating to the leg  Physical examination Her neck shows full range of motion and that she has a neck forward position with the flexion angle of about 10  No pain on neck movement or compression  Back reveals some tightness over her lower lumbar muscles and some loss of normal lordosis Range of motion is normal with the exception that extension is tight and somewhat limited to about 20 FABER test is tight bilaterally SI joint testing is tight bilaterally Negative straight leg raise Negative neurologic testing for both the neck: C5-T1 and for both lower extremities  Walking gait and strength are normal

## 2011-12-26 NOTE — Assessment & Plan Note (Signed)
I renewed her medications as both of these seem to be helping She will use the amitriptyline consistently for the tightness in any radiating pain She will use the Mobic only as needed  I reinforced doing neck range of motion and some isometric exercises along with better posture  I also gave her knee to chest / knee to the opposite shoulder and pelvic tilts to do daily for her back  She will see me as needed and in one year medications can be refilled at that time

## 2012-02-20 ENCOUNTER — Other Ambulatory Visit (HOSPITAL_COMMUNITY)
Admission: RE | Admit: 2012-02-20 | Discharge: 2012-02-20 | Disposition: A | Discharge status: Home / Self Care | Payer: BC Managed Care – PPO | Source: Ambulatory Visit | Attending: Family Medicine | Admitting: Family Medicine

## 2012-02-20 ENCOUNTER — Other Ambulatory Visit: Payer: Self-pay | Admitting: Family Medicine

## 2012-02-20 DIAGNOSIS — Z124 Encounter for screening for malignant neoplasm of cervix: Secondary | ICD-10-CM | POA: Insufficient documentation

## 2012-04-05 ENCOUNTER — Other Ambulatory Visit: Payer: Self-pay

## 2012-04-05 DIAGNOSIS — Z1231 Encounter for screening mammogram for malignant neoplasm of breast: Secondary | ICD-10-CM

## 2012-04-29 ENCOUNTER — Ambulatory Visit
Admission: RE | Admit: 2012-04-29 | Discharge: 2012-04-29 | Disposition: A | Discharge status: Home / Self Care | Payer: BC Managed Care – PPO | Source: Ambulatory Visit

## 2012-04-29 DIAGNOSIS — Z1231 Encounter for screening mammogram for malignant neoplasm of breast: Secondary | ICD-10-CM

## 2013-03-27 ENCOUNTER — Encounter: Payer: Self-pay | Admitting: Neurology

## 2013-03-27 ENCOUNTER — Ambulatory Visit (INDEPENDENT_AMBULATORY_CARE_PROVIDER_SITE_OTHER): Payer: BC Managed Care – PPO | Admitting: Neurology

## 2013-03-27 VITALS — BP 102/58 | HR 66 | Ht 68.25 in | Wt 215.0 lb

## 2013-03-27 DIAGNOSIS — R209 Unspecified disturbances of skin sensation: Secondary | ICD-10-CM

## 2013-03-27 DIAGNOSIS — R202 Paresthesia of skin: Secondary | ICD-10-CM | POA: Insufficient documentation

## 2013-03-27 DIAGNOSIS — M542 Cervicalgia: Secondary | ICD-10-CM

## 2013-03-27 NOTE — Progress Notes (Signed)
PATIENT: Sharon [name] Adcock DOB: December 15, 1965  HISTORICAL  Sharon [name] Sharon [name] is a 48 years old right-handed African American female, referred by her primary care physician Dr., Tenny Crawoss  for evaluation of low back pain, neck pain, stiffness  About 2 years ago, she noticed gradual onset midline low back pain, she is very active, playing tennis almost every day, she denies difficulty pain, but occasionally, she has sharp shooting pain along bilateral inner thigh area, usually triggered by sudden movement, such as turning in her bed, she denies gait difficulty, sometimes numbness tingling burning sensation at the ball of her feet,  She also complains of intermittent neck stiffness, with sudden turning, sometimes she get electric shock sensation, seems to correlate with her bilateral inner thigh area radiating discomfort,  last few seconds, gradually ease up   since summer of 014, she also complains of few episodes of bowel incontinence, she denies bladder incontinence, there was no genital area sensory loss.    REVIEW OF SYSTEMS: Full 14 system review of systems performed and notable only for low back stiffness  ALLERGIES: No Known Allergies  HOME MEDICATIONS: Current Outpatient Prescriptions on File Prior to Visit  Medication Sig Dispense Refill  . amitriptyline (ELAVIL) 25 MG tablet Take 1 tablet (25 mg total) by mouth at bedtime.  30 tablet  11   No current facility-administered medications on file prior to visit.    PAST MEDICAL HISTORY: Past Medical History  Diagnosis Date  . GERD (gastroesophageal reflux disease)     PAST SURGICAL HISTORY: Past Surgical History  Procedure Laterality Date  . Cesarean section    . Laparoscopy      FAMILY HISTORY: Family History  Problem Relation Age of Onset  . Diabetes Father   . High Cholesterol Mother   . High Cholesterol Sister     SOCIAL HISTORY:  History   Social History  . Marital Status: Married    Spouse Name: Alden Serverrnest    Number of  Children: 3  . Years of Education: 16   Occupational History  . Home maker    Social History Main Topics  . Smoking status: Never Smoker   . Smokeless tobacco: Never Used  . Alcohol Use: Yes     Comment: rare, social  . Drug Use: No  . Sexual Activity: Not on file   Other Topics Concern  . Not on file   Social History Narrative   Patient is married Alden Server(Ernest) and lives at home with her family.   Patient has three children.   Patient is a homemaker.   Patient does drink caffeine maybe two cups per week.   Patient is right-handed.   Patient has a BS degree.              PHYSICAL EXAM   Filed Vitals:   03/27/13 0823  BP: 102/58  Pulse: 66  Height: 5' 8.25" (1.734 m)  Weight: 215 lb (97.523 kg)    Not recorded    Body mass index is 32.43 kg/(m^2).   Generalized: In no acute distress  Neck: Supple, no carotid bruits   Cardiac: Regular rate rhythm  Pulmonary: Clear to auscultation bilaterally  Musculoskeletal: No deformity  Neurological examination  Mentation: Alert oriented to time, place, history taking, and causual conversation  Cranial nerve II-XII: Pupils were equal round reactive to light. Extraocular movements were full.  Visual field were full on confrontational test. Bilateral fundi were sharp.  Facial sensation and strength were normal. Hearing was intact to finger  rubbing bilaterally. Uvula tongue midline.  Head turning and shoulder shrug and were normal and symmetric.Tongue protrusion into cheek strength was normal.  Motor: Normal tone, bulk and strength.  Sensory: Intact to fine touch, pinprick, preserved vibratory sensation, and proprioception at toes.  Coordination: Normal finger to nose, heel-to-shin bilaterally there was no truncal ataxia  Gait: Rising up from seated position without assistance, normal stance, without trunk ataxia, moderate stride, good arm swing, smooth turning, able to perform tiptoe, and heel walking without difficulty.     Romberg signs: Negative  Deep tendon reflexes: Brachioradialis 2/2, biceps 2/2, triceps 2/2, patellar 2/2, Achilles 2/2, plantar responses were flexor bilaterally.   DIAGNOSTIC DATA (LABS, IMAGING, TESTING) - I reviewed patient records, labs, notes, testing and imaging myself where available.   ASSESSMENT AND PLAN  Sharon [name] is a 48 y.o. female presenting with 2 years history of intermittent low back pain, neck stiffness, radiating pain to her bilateral inner thigh, occasionally bowel incontinence,   She is still very active, able to play tennis almost every day without difficulty  1.  differentiation diagnosis, need to rule out cervical, lumbar stenosis, 2 proceed with MRI of cervical, and lumbar, 3 I will call her report.  Levert Feinstein, M.D. Ph.D.  [facility] [address] [place], [place] [address] [phone]

## 2013-04-09 ENCOUNTER — Ambulatory Visit (INDEPENDENT_AMBULATORY_CARE_PROVIDER_SITE_OTHER): Payer: BC Managed Care – PPO

## 2013-04-09 DIAGNOSIS — R202 Paresthesia of skin: Secondary | ICD-10-CM

## 2013-04-09 DIAGNOSIS — R209 Unspecified disturbances of skin sensation: Secondary | ICD-10-CM

## 2013-04-09 DIAGNOSIS — M542 Cervicalgia: Secondary | ICD-10-CM

## 2013-04-16 ENCOUNTER — Telehealth: Payer: Self-pay | Admitting: Neurology

## 2013-04-16 DIAGNOSIS — M542 Cervicalgia: Secondary | ICD-10-CM

## 2013-04-16 NOTE — Telephone Encounter (Signed)
I have called Sharon [name]: MRI result.  She still has tingling in her neck, and she also complains of pain at her legs.  At L5-S1: disc bulging and facet hypertrophy with moderate biforaminal foraminal stenosis; potential impingement upon the bilateral exiting L5 roots  At L4-5: disc bulging and facet hypertrophy with mild biforaminal foraminal stenosis     MRI cervical spine (without) demonstrating: 1. At C4-5: uncovertebral joint hypertrophy and facet hypertrophy with moderate left foraminal stenosis  2. At C5-6: uncovertebral joint hypertrophy and facet hypertrophy with mild right and severe left foraminal stenosis   EMG/NCS    please call her for EMG nerve conduction study appointment

## 2013-05-14 ENCOUNTER — Encounter (INDEPENDENT_AMBULATORY_CARE_PROVIDER_SITE_OTHER): Payer: Self-pay

## 2013-05-14 ENCOUNTER — Ambulatory Visit (INDEPENDENT_AMBULATORY_CARE_PROVIDER_SITE_OTHER): Payer: BC Managed Care – PPO | Admitting: Neurology

## 2013-05-14 DIAGNOSIS — Z0289 Encounter for other administrative examinations: Secondary | ICD-10-CM

## 2013-05-14 DIAGNOSIS — M79609 Pain in unspecified limb: Secondary | ICD-10-CM

## 2013-05-14 DIAGNOSIS — M79606 Pain in leg, unspecified: Secondary | ICD-10-CM | POA: Insufficient documentation

## 2013-05-14 DIAGNOSIS — R209 Unspecified disturbances of skin sensation: Secondary | ICD-10-CM

## 2013-05-14 DIAGNOSIS — R202 Paresthesia of skin: Secondary | ICD-10-CM

## 2013-05-14 DIAGNOSIS — M542 Cervicalgia: Secondary | ICD-10-CM

## 2013-05-14 NOTE — Procedures (Signed)
   NCS (NERVE CONDUCTION STUDY) WITH EMG (ELECTROMYOGRAPHY) REPORT   STUDY DATE: April 22nd 2015 PATIENT NAME: Sharon [name] DOB: 1965/02/04 MRN: [id number]    TECHNOLOGIST: Gearldine ShownLorraine Jones ELECTROMYOGRAPHER: Levert FeinsteinYan, Anahis Furgeson M.D.  CLINICAL INFORMATION 48 years old [place] female, presenting with shooting pain to bilaterally inner thigh  FINDINGS: NERVE CONDUCTION STUDY: Bilateral peroneal sensory responses were normal. Bilateral peroneal to EDB, and tibial motor responses were normal. Bilateral tibial H. reflexes were normal and symmetric.  Bilateral median, ulnar sensory and motor responses were normal  NEEDLE ELECTROMYOGRAPHY: Selected needle examination was performed at right lower extremity muscles, right lumbosacral paraspinal muscles.  Needle examination of right tibialis anterior, tibialis posterior, medial gastrocnemius, vastus lateralis, peroneal longus, biceps femoris long head, biceps femoris short head, gluteus medius was normal.  There was no spontaneous activity at right lumbosacral paraspinal muscles, right L4, L5, S1  IMPRESSION:   This is a normal study. There is no electrodiagnostic evidence of large fiber peripheral neuropathy, right lumbosacral radiculopathy, or myopathy.  INTERPRETING PHYSICIAN:   Levert FeinsteinYan, Zamiya Dillard M.D. Ph.D. [facility] [address], [address] [place], [place] [id number] [phone]

## 2013-05-14 NOTE — Progress Notes (Signed)
PATIENT: Sharon ReaperLisa Finazzo DOB: October 19, 1965  HISTORICAL  Sharon ReaperLisa Eaker is a 48 years old right-handed African American female, referred by her primary care physician Dr., Tenny Crawoss  for evaluation of low back pain, neck pain, stiffness  About 2 years ago, she noticed gradual onset midline low back pain, she is very active, playing tennis almost every day, she denies difficulty pain, but occasionally, she has sharp shooting pain along bilateral inner thigh area, usually triggered by sudden movement, such as turning in her bed, she denies gait difficulty, sometimes numbness tingling burning sensation at the ball of her feet,  She also complains of intermittent neck stiffness, with sudden turning, sometimes she get electric shock sensation, seems to correlate with her bilateral inner thigh area radiating discomfort,  last few seconds, gradually ease up   since summer of 014, she also complains of few episodes of bowel incontinence, she denies bladder incontinence, there was no genital area sensory loss.   UPDATE April 22nd 2015: She has occasionally shooting discomfort to her bilateral inner thigh, right worse than left, she denies gait difficulty.  We have reviewed the MRI together, MRI of the cervical spine showed C4-5: uncovertebral joint hypertrophy and facet hypertrophy with moderate left foraminal stenosis.  C5-6: uncovertebral joint hypertrophy and facet hypertrophy with mild right and severe left foraminal stenosis.  MRI lumbar spine: L5-S1: disc bulging and facet hypertrophy with moderate biforaminal foraminal stenosis; potential impingement upon the bilateral exiting L5 roots. L4-5: disc bulging and facet hypertrophy with mild biforaminal foraminal stenosis   She had electrodiagnostic study today, which was essentially normal, there was no evidence of large fiber peripheral neuropathy, or  right lumbar radiculopathy   REVIEW OF SYSTEMS: Full 14 system review of systems performed and notable only  for as above  ALLERGIES: No Known Allergies  HOME MEDICATIONS: Current Outpatient Prescriptions on File Prior to Visit  Medication Sig Dispense Refill  . amitriptyline (ELAVIL) 25 MG tablet Take 1 tablet (25 mg total) by mouth at bedtime.  30 tablet  11  . meloxicam (MOBIC) 15 MG tablet 1 tablet daily as needed.      . pantoprazole (PROTONIX) 40 MG tablet 1 tablet daily as needed.       No current facility-administered medications on file prior to visit.    PAST MEDICAL HISTORY: Past Medical History  Diagnosis Date  . GERD (gastroesophageal reflux disease)     PAST SURGICAL HISTORY: Past Surgical History  Procedure Laterality Date  . Cesarean section    . Laparoscopy      FAMILY HISTORY: Family History  Problem Relation Age of Onset  . Diabetes Father   . High Cholesterol Mother   . High Cholesterol Sister     SOCIAL HISTORY:  History   Social History  . Marital Status: Married    Spouse Name: Alden Serverrnest    Number of Children: 3  . Years of Education: 16   Occupational History  . Home maker    Social History Main Topics  . Smoking status: Never Smoker   . Smokeless tobacco: Never Used  . Alcohol Use: Yes     Comment: rare, social  . Drug Use: No  . Sexual Activity: Not on file   Other Topics Concern  . Not on file   Social History Narrative   Patient is married Alden Server(Ernest) and lives at home with her family.   Patient has three children.   Patient is a homemaker.   Patient does drink caffeine maybe  two cups per week.   Patient is right-handed.   Patient has a BS degree.              PHYSICAL EXAM   There were no vitals filed for this visit.  Not recorded    There is no weight on file to calculate BMI.   Generalized: In no acute distress  Neck: Supple, no carotid bruits   Cardiac: Regular rate rhythm  Pulmonary: Clear to auscultation bilaterally  Musculoskeletal: No deformity  Neurological examination  Mentation: Alert oriented to  time, place, history taking, and causual conversation  Cranial nerve II-XII: Pupils were equal round reactive to light. Extraocular movements were full.  Visual field were full on confrontational test. Bilateral fundi were sharp.  Facial sensation and strength were normal. Hearing was intact to finger rubbing bilaterally. Uvula tongue midline.  Head turning and shoulder shrug and were normal and symmetric.Tongue protrusion into cheek strength was normal.  Motor: Normal tone, bulk and strength  Sensory: Intact to fine touch, pinprick, preserved vibratory sensation, and proprioception at toes.  Coordination: Normal finger to nose, heel-to-shin bilaterally there was no truncal ataxia  Gait: Rising up from seated position without assistance, normal stance, without trunk ataxia, moderate stride, good arm swing, smooth turning, able to perform tiptoe, and heel walking without difficulty.   Romberg signs: Negative  Deep tendon reflexes: Brachioradialis 2/2, biceps 2/2, triceps 2/2, patellar 2/2, Achilles 2/2, plantar responses were flexor bilaterally.   DIAGNOSTIC DATA (LABS, IMAGING, TESTING) - I reviewed patient records, labs, notes, testing and imaging myself where available.   ASSESSMENT AND PLAN  Sharon [name] is a 48 y.o. female presenting with 2 years history of intermittent low back pain, neck stiffness, radiating pain to her bilateral inner thigh, occasionally bowel incontinence,   She is still very active, able to play tennis almost every day without difficulty, essentially normal neurological examination, MRI of cervical, and lumbar spine failed to demonstrate significant structural lesions, normal electrodiagnostic study,  Most likely musculoskeletal etiology, I have advised her continue moderate exercise, return to clinic as needed   Levert FeinsteinYijun Neziah Vogelgesang, M.D. Ph.D.  [facility] [address], [address] [place], [place] [address] [phone]

## 2013-05-27 ENCOUNTER — Emergency Department (INDEPENDENT_AMBULATORY_CARE_PROVIDER_SITE_OTHER): Payer: BC Managed Care – PPO

## 2013-05-27 ENCOUNTER — Encounter (HOSPITAL_COMMUNITY): Payer: Self-pay | Admitting: Emergency Medicine

## 2013-05-27 ENCOUNTER — Emergency Department (INDEPENDENT_AMBULATORY_CARE_PROVIDER_SITE_OTHER)
Admission: EM | Admit: 2013-05-27 | Discharge: 2013-05-27 | Disposition: A | Discharge status: Home / Self Care | Payer: BC Managed Care – PPO | Source: Home / Self Care

## 2013-05-27 DIAGNOSIS — Y9373 Activity, racquet and hand sports: Secondary | ICD-10-CM

## 2013-05-27 DIAGNOSIS — Y92838 Other recreation area as the place of occurrence of the external cause: Secondary | ICD-10-CM

## 2013-05-27 DIAGNOSIS — S93401A Sprain of unspecified ligament of right ankle, initial encounter: Secondary | ICD-10-CM

## 2013-05-27 DIAGNOSIS — S93409A Sprain of unspecified ligament of unspecified ankle, initial encounter: Secondary | ICD-10-CM

## 2013-05-27 DIAGNOSIS — Y9239 Other specified sports and athletic area as the place of occurrence of the external cause: Secondary | ICD-10-CM

## 2013-05-27 DIAGNOSIS — X500XXA Overexertion from strenuous movement or load, initial encounter: Secondary | ICD-10-CM

## 2013-05-27 HISTORY — DX: Exercise induced bronchospasm: J45.990

## 2013-05-27 NOTE — Discharge Instructions (Signed)
Ankle Sprain °An ankle sprain is an injury to the strong, fibrous tissues (ligaments) that hold the bones of your ankle joint together.  °CAUSES °An ankle sprain is usually caused by a fall or by twisting your ankle. Ankle sprains most commonly occur when you step on the outer edge of your foot, and your ankle turns inward. People who participate in sports are more prone to these types of injuries.  °SYMPTOMS  °· Pain in your ankle. The pain may be present at rest or only when you are trying to stand or walk. °· Swelling. °· Bruising. Bruising may develop immediately or within 1 to 2 days after your injury. °· Difficulty standing or walking, particularly when turning corners or changing directions. °DIAGNOSIS  °Your caregiver will ask you details about your injury and perform a physical exam of your ankle to determine if you have an ankle sprain. During the physical exam, your caregiver will press on and apply pressure to specific areas of your foot and ankle. Your caregiver will try to move your ankle in certain ways. An X-ray exam may be done to be sure a bone was not broken or a ligament did not separate from one of the bones in your ankle (avulsion fracture).  °TREATMENT  °Certain types of braces can help stabilize your ankle. Your caregiver can make a recommendation for this. Your caregiver may recommend the use of medicine for pain. If your sprain is severe, your caregiver may refer you to a surgeon who helps to restore function to parts of your skeletal system (orthopedist) or a physical therapist. °HOME CARE INSTRUCTIONS  °· Apply ice to your injury for 1 2 days or as directed by your caregiver. Applying ice helps to reduce inflammation and pain. °· Put ice in a plastic bag. °· Place a towel between your skin and the bag. °· Leave the ice on for 15-20 minutes at a time, every 2 hours while you are awake. °· Only take over-the-counter or prescription medicines for pain, discomfort, or fever as directed by  your caregiver. °· Elevate your injured ankle above the level of your heart as much as possible for 2 3 days. °· If your caregiver recommends crutches, use them as instructed. Gradually put weight on the affected ankle. Continue to use crutches or a cane until you can walk without feeling pain in your ankle. °· If you have a plaster splint, wear the splint as directed by your caregiver. Do not rest it on anything harder than a pillow for the first 24 hours. Do not put weight on it. Do not get it wet. You may take it off to take a shower or bath. °· You may have been given an elastic bandage to wear around your ankle to provide support. If the elastic bandage is too tight (you have numbness or tingling in your foot or your foot becomes cold and blue), adjust the bandage to make it comfortable. °· If you have an air splint, you may blow more air into it or let air out to make it more comfortable. You may take your splint off at night and before taking a shower or bath. Wiggle your toes in the splint several times per day to decrease swelling. °SEEK MEDICAL CARE IF:  °· You have rapidly increasing bruising or swelling. °· Your toes feel extremely cold or you lose feeling in your foot. °· Your pain is not relieved with medicine. °SEEK IMMEDIATE MEDICAL CARE IF: °· Your toes are numb   or blue.  You have severe pain that is increasing. MAKE SURE YOU:   Understand these instructions.  Will watch your condition.  Will get help right away if you are not doing well or get worse. Document Released: 01/09/2005 Document Revised: 10/04/2011 Document Reviewed: 01/21/2011 [facility] Patient Information 2014 [place], [place].  Ligament Sprain Ligaments are tough, fibrous tissues that hold bones together at the joints. A sprain can occur when a ligament is stretched. This injury may take several weeks to heal. HOME CARE INSTRUCTIONS   Rest the injured area for as long as directed by your caregiver. Then slowly start  using the joint as directed by your caregiver and as the pain allows.  Keep the affected joint raised if possible to lessen swelling.  Apply ice for 15-20 minutes to the injured area every couple hours for the first half day, then 03-04 times per day for the first 48 hours. Put the ice in a plastic bag and place a towel between the bag of ice and your skin.  Wear any splinting, casting, or elastic bandage applications as instructed.  Only take over-the-counter or prescription medicines for pain, discomfort, or fever as directed by your caregiver. Do not use aspirin immediately after the injury unless instructed by your caregiver. Aspirin can cause increased bleeding and bruising of the tissues.  If you were given crutches, continue to use them as instructed and do not resume weight bearing on the affected extremity until instructed. SEEK MEDICAL CARE IF:   Your bruising, swelling, or pain increases.  You have cold and numb fingers or toes if your arm or leg was injured. SEEK IMMEDIATE MEDICAL CARE IF:   Your toes are numb or blue if your leg was injured.  Your fingers are numb or blue if your arm was injured.  Your pain is not responding to medicines and continues to stay the same or gets worse. MAKE SURE YOU:   Understand these instructions.  Will watch your condition.  Will get help right away if you are not doing well or get worse. Document Released: 01/07/2000 Document Revised: 04/03/2011 Document Reviewed: 11/05/2007 [facility] Patient Information 2014 [place], [place].

## 2013-05-27 NOTE — ED Provider Notes (Signed)
CSN: [id number]     Arrival date & time 05/27/13  1119 History   First MD Initiated Contact with Patient 05/27/13 1305     Chief Complaint  Patient presents with  . Ankle Injury   (Consider location/radiation/quality/duration/timing/severity/associated sxs/prior Treatment) HPI Comments: 48 y o F playing tennis yesterday and experienced an inversion injury to the R ankle. Presents with pain and swelling   Patient is a 48 y.o. female presenting with lower extremity injury.  Ankle Injury Pertinent negatives include no chest pain and no shortness of breath.    Past Medical History  Diagnosis Date  . GERD (gastroesophageal reflux disease)   . Exertional asthma    Past Surgical History  Procedure Laterality Date  . Cesarean section    . Laparoscopy     Family History  Problem Relation Age of Onset  . Diabetes Father   . High Cholesterol Mother   . High Cholesterol Sister    History  Substance Use Topics  . Smoking status: Never Smoker   . Smokeless tobacco: Never Used  . Alcohol Use: Yes     Comment: rare, social   OB History   Grav Para Term Preterm Abortions TAB SAB Ect Mult Living                 Review of Systems  Constitutional: Negative for fever, chills and activity change.  Respiratory: Negative for shortness of breath.   Cardiovascular: Negative for chest pain.  Musculoskeletal:       As per HPI  Skin: Negative for color change and pallor.  Neurological: Negative.     Allergies  Review of patient's allergies indicates no known allergies.  Home Medications   Prior to Admission medications   Medication Sig Start Date End Date Taking? Authorizing Provider  amitriptyline (ELAVIL) 25 MG tablet Take 1 tablet (25 mg total) by mouth at bedtime. 12/26/11   Enid BaasKarl Fields, MD  meloxicam (MOBIC) 15 MG tablet 1 tablet daily as needed. 03/25/13   Historical Provider, MD  pantoprazole (PROTONIX) 40 MG tablet 1 tablet daily as needed. 02/18/13   Historical Provider, MD    BP 119/68  Pulse 64  Temp(Src) 98.5 F (36.9 C) (Oral)  Resp 18  SpO2 100%  LMP 03/27/2013 Physical Exam  Nursing note and vitals reviewed. Constitutional: She is oriented to person, place, and time. She appears well-developed and well-nourished. No distress.  HENT:  Head: Normocephalic and atraumatic.  Neck: Normal range of motion. Neck supple.  Musculoskeletal:  Tenderness and swelling over the lateral malleolus. No bony tenderness. No deformity. No foot sx's. Pedial pulse 2+ Dorsi and plantar flexion intact.   Neurological: She is alert and oriented to person, place, and time. No cranial nerve deficit.  Skin: Skin is warm and dry.  Psychiatric: She has a normal mood and affect.    ED Course  Procedures (including critical care time) Labs Review Labs Reviewed - No data to display  Imaging Review Dg Ankle Complete Right  05/27/2013   CLINICAL DATA:  ankle  EXAM: RIGHT ANKLE - COMPLETE 3+ VIEW  COMPARISON:  None.  FINDINGS: There is no evidence of fracture, dislocation, or joint effusion. There is no evidence of arthropathy or other focal bone abnormality. Soft tissue swelling is appreciated within the lateral malleolar region.  IMPRESSION: No acute osseous abnormalities. Findings which may represent soft tissue injury in the lateral malleolar region.   Electronically Signed   By: Salome HolmesHector  Cooper M.D.   On: 05/27/2013 13:14  MDM   1. Right ankle sprain    RICE ASO Crutches with minimal wt bearing for a few days.    Hayden Rasmussenavid Noma Quijas, NP 05/27/13 1346

## 2013-05-27 NOTE — ED Provider Notes (Signed)
Medical screening examination/treatment/procedure(s) were performed by resident physician or non-physician practitioner and as supervising physician I was immediately available for consultation/collaboration.   Tyler Cubit DOUGLAS MD.   Leanah Kolander D Miriam Kestler, MD 05/27/13 2151 

## 2013-05-27 NOTE — ED Notes (Signed)
Pt c/o right ankel injury yesterday while playing tennis. Pt reports she twisted her ankle. Has used ice and ibuprofen with mild relief of sx. Pt is alert and oriented and in no acute distress.

## 2013-06-10 ENCOUNTER — Other Ambulatory Visit: Payer: Self-pay

## 2013-06-10 DIAGNOSIS — Z1231 Encounter for screening mammogram for malignant neoplasm of breast: Secondary | ICD-10-CM

## 2013-06-26 ENCOUNTER — Ambulatory Visit
Admission: RE | Admit: 2013-06-26 | Discharge: 2013-06-26 | Disposition: A | Discharge status: Home / Self Care | Payer: BC Managed Care – PPO | Source: Ambulatory Visit

## 2013-06-26 DIAGNOSIS — Z1231 Encounter for screening mammogram for malignant neoplasm of breast: Secondary | ICD-10-CM

## 2014-11-03 ENCOUNTER — Other Ambulatory Visit: Payer: Self-pay

## 2014-11-03 DIAGNOSIS — Z1231 Encounter for screening mammogram for malignant neoplasm of breast: Secondary | ICD-10-CM

## 2014-11-25 ENCOUNTER — Ambulatory Visit: Payer: Self-pay

## 2014-12-24 ENCOUNTER — Ambulatory Visit
Admission: RE | Admit: 2014-12-24 | Discharge: 2014-12-24 | Disposition: A | Discharge status: Home / Self Care | Payer: BLUE CROSS/BLUE SHIELD | Source: Ambulatory Visit

## 2014-12-24 DIAGNOSIS — Z1231 Encounter for screening mammogram for malignant neoplasm of breast: Secondary | ICD-10-CM

## 2015-04-21 ENCOUNTER — Other Ambulatory Visit: Payer: Self-pay | Admitting: Family Medicine

## 2015-04-21 ENCOUNTER — Other Ambulatory Visit (HOSPITAL_COMMUNITY)
Admission: RE | Admit: 2015-04-21 | Discharge: 2015-04-21 | Disposition: A | Discharge status: Home / Self Care | Payer: BLUE CROSS/BLUE SHIELD | Source: Ambulatory Visit | Attending: Family Medicine | Admitting: Family Medicine

## 2015-04-21 DIAGNOSIS — Z124 Encounter for screening for malignant neoplasm of cervix: Secondary | ICD-10-CM | POA: Insufficient documentation

## 2015-04-22 LAB — CYTOLOGY - PAP

## 2015-05-14 ENCOUNTER — Ambulatory Visit (INDEPENDENT_AMBULATORY_CARE_PROVIDER_SITE_OTHER): Payer: BLUE CROSS/BLUE SHIELD | Admitting: Sports Medicine

## 2015-05-14 ENCOUNTER — Encounter: Payer: Self-pay | Admitting: Sports Medicine

## 2015-05-14 VITALS — BP 129/79 | Ht 69.0 in | Wt 210.0 lb

## 2015-05-14 DIAGNOSIS — M7711 Lateral epicondylitis, right elbow: Secondary | ICD-10-CM

## 2015-05-14 MED ORDER — NITROGLYCERIN 0.2 MG/HR TD PT24: MEDICATED_PATCH | TRANSDERMAL | Status: DC

## 2015-05-14 NOTE — Progress Notes (Signed)
Patient ID: Sharon LoronLisa J [name], female   DOB: 01/29/1965, 50 y.o.   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 RT elbow pain x 2 mos now gettng worse Had tennis elbow that repsponded to NTG a few yrs ago  Hurts to follow through on serve Hurts with forehand Mild pain w 2 hand backhand  ROS No numbness or tingling into hands Forearm muscle soreness and tight Pronation hurts more than supinatioin  Exam Pleasnt B F NAD BP 129/79 mmHg  Ht 5\' 9"  (1.753 m)  Wt 210 lb (95.255 kg)  BMI 31.00 kg/m2  RT elbow - full ROM Mild TTP at epic More TTP at proximal extensor mm compt Book test is + No swelling No tricepts or medial pain  US Ski jump Fx Mild hypoechoic splits in ext [employer] doppler flow w neovessels Hypoechoic change with some Ca+ in ext mm compt

## 2015-05-14 NOTE — Patient Instructions (Addendum)
Nitroglycerin Protocol   Apply 1/4 nitroglycerin patch to affected area daily.  Change position of patch within the affected area every 24 hours.  You may experience a headache during the first 1-2 weeks of using the patch, these should subside.  If you experience headaches after beginning nitroglycerin patch treatment, you may take your preferred over the counter pain reliever.  Another side effect of the nitroglycerin patch is skin irritation or rash related to patch adhesive.  Please notify our office if you develop more severe headaches or rash, and stop the patch.  Tendon healing with nitroglycerin patch may require 12 to 24 weeks depending on the extent of injury.  Men should not use if taking Viagra, Cialis, or Levitra.   Do not use if you have migraines or rosacea.   Start easy forearm exercises Use only 1 to 2 lbs Lots of reps - eg 3 sets of 30  Rolls Wrist extension and flexion Serving motion Forehand motion Backhand +/- Newman PiesBall squeezes  Return for repeat scan in 2 weeks  Try a soft overgrip Double string dampener If these changes aren't enough - drop string tension to 46 to 48  After playing - quick ice massage

## 2015-05-14 NOTE — Assessment & Plan Note (Signed)
She last had this in 2012  This time I think it was triggered by exercises with ropes  Compression HEP NTG protocol rescan 2 mos

## 2015-07-13 ENCOUNTER — Ambulatory Visit (INDEPENDENT_AMBULATORY_CARE_PROVIDER_SITE_OTHER): Payer: BLUE CROSS/BLUE SHIELD | Admitting: Sports Medicine

## 2015-07-13 ENCOUNTER — Encounter: Payer: Self-pay | Admitting: Sports Medicine

## 2015-07-13 VITALS — BP 117/57 | HR 58 | Ht 69.0 in | Wt 210.0 lb

## 2015-07-13 DIAGNOSIS — M7711 Lateral epicondylitis, right elbow: Secondary | ICD-10-CM | POA: Diagnosis not present

## 2015-07-13 NOTE — Assessment & Plan Note (Signed)
Improving rapidly with NTG protocol Able to play a lot even while healing  Cont protocol 6 more wks If no problems can wean off Tx If any persistent pain recheck in office

## 2015-07-13 NOTE — Progress Notes (Signed)
Patient ID: Lewie LoronLisa J [name], female   DOB: [birth year], 50 y.o.   MRN: [id number]  F/U RT Tennis elbow  6 wk follow-up Pain dec by more than 50% Has been able to TX this while playing tennis several times per wk Using NTG, compression, home exercise Massage has helped  Night pain only if she rolls over  ROS No neck sxs No numbness in RT arm, elbow or hand No other joint swelling  EXAM Strong B F in NAD Elbow Full ROM No swelling No TTP Book test is + but only mild pain now Exc. Forearm, biceps and triceps strength  US Hypoechoic change within extensor MM compt is reduced by 80% Lateral epidicondyle shows no splits or hypoechoic changs today Doppler flow is reduced by 75% Small avulsion at tip

## 2015-08-31 ENCOUNTER — Ambulatory Visit: Payer: BLUE CROSS/BLUE SHIELD | Admitting: Sports Medicine

## 2016-02-11 ENCOUNTER — Other Ambulatory Visit: Payer: Self-pay | Admitting: Family Medicine

## 2016-02-11 DIAGNOSIS — Z23 Encounter for immunization: Secondary | ICD-10-CM | POA: Diagnosis not present

## 2016-02-11 DIAGNOSIS — Z1231 Encounter for screening mammogram for malignant neoplasm of breast: Secondary | ICD-10-CM

## 2016-03-08 ENCOUNTER — Ambulatory Visit
Admission: RE | Admit: 2016-03-08 | Discharge: 2016-03-08 | Disposition: A | Discharge status: Home / Self Care | Payer: BLUE CROSS/BLUE SHIELD | Source: Ambulatory Visit | Attending: Family Medicine | Admitting: Family Medicine

## 2016-03-08 DIAGNOSIS — Z1231 Encounter for screening mammogram for malignant neoplasm of breast: Secondary | ICD-10-CM

## 2016-06-20 DIAGNOSIS — Z136 Encounter for screening for cardiovascular disorders: Secondary | ICD-10-CM | POA: Diagnosis not present

## 2016-06-20 DIAGNOSIS — Z0000 Encounter for general adult medical examination without abnormal findings: Secondary | ICD-10-CM | POA: Diagnosis not present

## 2016-06-21 DIAGNOSIS — Z0000 Encounter for general adult medical examination without abnormal findings: Secondary | ICD-10-CM | POA: Diagnosis not present

## 2016-07-10 DIAGNOSIS — Z1211 Encounter for screening for malignant neoplasm of colon: Secondary | ICD-10-CM | POA: Diagnosis not present

## 2016-07-10 DIAGNOSIS — K5730 Diverticulosis of large intestine without perforation or abscess without bleeding: Secondary | ICD-10-CM | POA: Diagnosis not present

## 2016-07-10 DIAGNOSIS — Z8371 Family history of colonic polyps: Secondary | ICD-10-CM | POA: Diagnosis not present

## 2016-09-15 DIAGNOSIS — H01022 Squamous blepharitis right lower eyelid: Secondary | ICD-10-CM | POA: Diagnosis not present

## 2016-09-15 DIAGNOSIS — H01024 Squamous blepharitis left upper eyelid: Secondary | ICD-10-CM | POA: Diagnosis not present

## 2016-09-15 DIAGNOSIS — H01021 Squamous blepharitis right upper eyelid: Secondary | ICD-10-CM | POA: Diagnosis not present

## 2016-09-15 DIAGNOSIS — H01025 Squamous blepharitis left lower eyelid: Secondary | ICD-10-CM | POA: Diagnosis not present

## 2017-04-26 ENCOUNTER — Ambulatory Visit: Payer: BLUE CROSS/BLUE SHIELD | Admitting: Sports Medicine

## 2017-10-17 ENCOUNTER — Other Ambulatory Visit: Payer: Self-pay | Admitting: Family Medicine

## 2017-10-17 DIAGNOSIS — Z1231 Encounter for screening mammogram for malignant neoplasm of breast: Secondary | ICD-10-CM

## 2017-11-13 DIAGNOSIS — M5416 Radiculopathy, lumbar region: Secondary | ICD-10-CM | POA: Diagnosis not present

## 2017-11-13 DIAGNOSIS — Z0000 Encounter for general adult medical examination without abnormal findings: Secondary | ICD-10-CM | POA: Diagnosis not present

## 2017-11-13 DIAGNOSIS — K219 Gastro-esophageal reflux disease without esophagitis: Secondary | ICD-10-CM | POA: Diagnosis not present

## 2017-11-13 DIAGNOSIS — Z23 Encounter for immunization: Secondary | ICD-10-CM | POA: Diagnosis not present

## 2017-11-14 ENCOUNTER — Ambulatory Visit
Admission: RE | Admit: 2017-11-14 | Discharge: 2017-11-14 | Disposition: A | Discharge status: Home / Self Care | Payer: BLUE CROSS/BLUE SHIELD | Source: Ambulatory Visit | Attending: Family Medicine | Admitting: Family Medicine

## 2017-11-14 DIAGNOSIS — Z1231 Encounter for screening mammogram for malignant neoplasm of breast: Secondary | ICD-10-CM | POA: Diagnosis not present

## 2018-01-14 ENCOUNTER — Ambulatory Visit (INDEPENDENT_AMBULATORY_CARE_PROVIDER_SITE_OTHER): Payer: BLUE CROSS/BLUE SHIELD | Admitting: Neurology

## 2018-01-14 ENCOUNTER — Encounter: Payer: Self-pay | Admitting: Neurology

## 2018-01-14 VITALS — BP 115/70 | HR 62 | Ht 69.0 in | Wt 222.0 lb

## 2018-01-14 DIAGNOSIS — M79604 Pain in right leg: Secondary | ICD-10-CM | POA: Diagnosis not present

## 2018-01-14 DIAGNOSIS — M25551 Pain in right hip: Secondary | ICD-10-CM | POA: Insufficient documentation

## 2018-01-14 NOTE — Progress Notes (Signed)
PATIENT: Sharon [name] DOB: November 03, 1965  Chief Complaint  Patient presents with  . Leg Pain    Reports intermittent right-sided leg pain and cramping in her feet.  Marland Kitchen. PCP    Daisy Florooss, Charles Alan, MD     HISTORICAL  Sharon LoronLisa J Streiff is of 52 year old female seen in request by her primary care physician Dr. Tenny Crawoss, Darlen Roundharles Alan for evaluation of right leg pain,  I saw her initially in April 2015 for similar complaints,  Around 2013, she noticed gradual onset midline low back pain, she is very active, playing tennis almost every day, she denies difficulty pain, but occasionally, she has sharp shooting pain along bilateral inner thigh area, usually triggered by sudden movement, such as turning in her bed, she denies gait difficulty, sometimes numbness tingling burning sensation at the ball of her feet,  She also complains of intermittent neck stiffness, with sudden turning, sometimes she get electric shock sensation, seems to correlate with her bilateral inner thigh area radiating discomfort,  last few seconds, gradually ease up   since summer of 014, she also complains of few episodes of bowel incontinence, she denies bladder incontinence, there was no genital area sensory loss.   UPDATE April 22nd 2015: She has occasionally shooting discomfort to her bilateral inner thigh, right worse than left, she denies gait difficulty.  We have reviewed the MRI together, MRI of the cervical spine showed C4-5: uncovertebral joint hypertrophy and facet hypertrophy with moderate left foraminal stenosis.  C5-6: uncovertebral joint hypertrophy and facet hypertrophy with mild right and severe left foraminal stenosis.  MRI lumbar spine: L5-S1: disc bulging and facet hypertrophy with moderate biforaminal foraminal stenosis; potential impingement upon the bilateral exiting L5 roots. L4-5: disc bulging and facet hypertrophy with mild biforaminal foraminal stenosis   EMG/NCS in April 2015 normal study. There  is no electrodiagnostic evidence of large fiber peripheral neuropathy, right lumbosacral radiculopathy, or myopathy.  Stiffness in neck, shoulder, radiating pain to right leg, right hand itching at palm a lot, that has stopped, main thing is shooting pain to right leg, going forward tripping her right leg, right leg decreased responsde,   She had electrodiagnostic study in April 2015, which was essentially normal, there was no evidence of large fiber peripheral neuropathy, or  right lumbar radiculopathy  She is able to continue to practice her next regularly, intermittent right-sided leg pain, and recent primary care visit, she was noted to have decreased right knee reflex, that is why she is referred here, overall there is no significant change or even improvement compared to previous complaint in 2015, Laboratory evaluation showed normal CBC, Hg 13.3, CMP, TSH, FSH, LDL 144, Chole 232,    REVIEW OF SYSTEMS: Full 14 system review of systems performed and notable only for as above All other review of systems were negative.  ALLERGIES: No Known Allergies  HOME MEDICATIONS: Current Outpatient Medications  Medication Sig Dispense Refill  . amitriptyline (ELAVIL) 25 MG tablet Take 1 tablet (25 mg total) by mouth at bedtime. 30 tablet 11  . meloxicam (MOBIC) 15 MG tablet 1 tablet daily as needed.    . pantoprazole (PROTONIX) 40 MG tablet 1 tablet daily as needed.     No current facility-administered medications for this visit.     PAST MEDICAL HISTORY: Past Medical History:  Diagnosis Date  . Exertional asthma   . GERD (gastroesophageal reflux disease)   . Right leg pain     PAST SURGICAL HISTORY: Past Surgical History:  Procedure Laterality Date  . CESAREAN SECTION    . LAPAROSCOPY      FAMILY HISTORY: Family History  Problem Relation Age of Onset  . Diabetes Father   . Lung cancer Father   . High Cholesterol Mother     SOCIAL HISTORY: Social History   Socioeconomic  History  . Marital status: Married    Spouse name: Alden Serverrnest  . Number of children: 3  . Years of education: 5116  . Highest education level: Not on file  Occupational History  . Occupation: Futures traderHomemaker  Social Needs  . Financial resource strain: Not on file  . Food insecurity:    Worry: Not on file    Inability: Not on file  . Transportation needs:    Medical: Not on file    Non-medical: Not on file  Tobacco Use  . Smoking status: Never Smoker  . Smokeless tobacco: Never Used  Substance and Sexual Activity  . Alcohol use: Yes    Comment: rare, social  . Drug use: No  . Sexual activity: Not on file  Lifestyle  . Physical activity:    Days per week: Not on file    Minutes per session: Not on file  . Stress: Not on file  Relationships  . Social connections:    Talks on phone: Not on file    Gets together: Not on file    Attends religious service: Not on file    Active member of club or organization: Not on file    Attends meetings of clubs or organizations: Not on file    Relationship status: Not on file  . Intimate partner violence:    Fear of current or ex partner: Not on file    Emotionally abused: Not on file    Physically abused: Not on file    Forced sexual activity: Not on file  Other Topics Concern  . Not on file  Social History Narrative   Patient is married Alden Server(Ernest) and lives at home with her family.   Patient has three children.   Patient is a homemaker.   Patient does drink caffeine maybe two cups per week.   Patient is right-handed.   Patient has a BS degree.              PHYSICAL EXAM   Vitals:   01/14/18 0721  BP: 115/70  Pulse: 62  Weight: 222 lb (100.7 kg)  Height: 5\' 9"  (1.753 m)    Not recorded      Body mass index is 32.78 kg/m.  PHYSICAL EXAMNIATION:  Gen: NAD, conversant, well nourised, obese, well groomed                     Cardiovascular: Regular rate rhythm, no peripheral edema, warm, nontender. Eyes: Conjunctivae clear  without exudates or hemorrhage Neck: Supple, no carotid bruits. Pulmonary: Clear to auscultation bilaterally   NEUROLOGICAL EXAM:  MENTAL STATUS: Speech:    Speech is normal; fluent and spontaneous with normal comprehension.  Cognition:     Orientation to time, place and person     Normal recent and remote memory     Normal Attention span and concentration     Normal Language, naming, repeating,spontaneous speech     Fund of knowledge   CRANIAL NERVES: CN II: Visual fields are full to confrontation. Fundoscopic exam is normal with sharp discs and no vascular changes. Pupils are round equal and briskly reactive to light. CN III, IV, VI: extraocular movement  are normal. No ptosis. CN V: Facial sensation is intact to pinprick in all 3 divisions bilaterally. Corneal responses are intact.  CN VII: Face is symmetric with normal eye closure and smile. CN VIII: Hearing is normal to rubbing fingers CN IX, X: Palate elevates symmetrically. Phonation is normal. CN XI: Head turning and shoulder shrug are intact CN XII: Tongue is midline with normal movements and no atrophy.  MOTOR: There is no pronator drift of out-stretched arms. Muscle bulk and tone are normal. Muscle strength is normal.  REFLEXES: Reflexes are 2+ and symmetric at the biceps, triceps, knees, and ankles. Plantar responses are flexor.  SENSORY: Intact to light touch, pinprick, positional sensation and vibratory sensation are intact in fingers and toes.  COORDINATION: Rapid alternating movements and fine finger movements are intact. There is no dysmetria on finger-to-nose and heel-knee-shin.    GAIT/STANCE: Posture is normal. Gait is steady with normal steps, base, arm swing, and turning. Heel and toe walking are normal. Tandem gait is normal.  Romberg is absent.   DIAGNOSTIC DATA (LABS, IMAGING, TESTING) - I reviewed patient records, labs, notes, testing and imaging myself where available.   ASSESSMENT AND  PLAN  PRAKRITI CARIGNAN is a 53 y.o. female   Right leg pain  Symmetric bilateral patella reflexes on today's examination  Extensive neurological evaluation showed no evidence of right lower extremity neuropathy or right lumbosacral radiculopathy,  Continue moderate exercise, as needed NSAIDs  Levert Feinstein, M.D. Ph.D.  [facility] [address] [place], [place] [address] Ph: [phone] Fax: [phone]  CC: Daisy Floro, MD

## 2018-03-19 ENCOUNTER — Ambulatory Visit (INDEPENDENT_AMBULATORY_CARE_PROVIDER_SITE_OTHER): Payer: BLUE CROSS/BLUE SHIELD | Admitting: Sports Medicine

## 2018-03-19 ENCOUNTER — Encounter: Payer: Self-pay | Admitting: Sports Medicine

## 2018-03-19 ENCOUNTER — Ambulatory Visit: Payer: Self-pay

## 2018-03-19 VITALS — BP 113/55 | Ht 68.5 in | Wt 220.0 lb

## 2018-03-19 DIAGNOSIS — M25512 Pain in left shoulder: Principal | ICD-10-CM

## 2018-03-19 DIAGNOSIS — G8929 Other chronic pain: Secondary | ICD-10-CM

## 2018-03-19 DIAGNOSIS — M25511 Pain in right shoulder: Secondary | ICD-10-CM

## 2018-03-19 MED ORDER — NITROGLYCERIN 0.2 MG/HR TD PT24: MEDICATED_PATCH | TRANSDERMAL | 1 refills | Status: DC

## 2018-03-19 NOTE — Progress Notes (Signed)
CC: left shoulder pain  Patient plays tennis RT handed Lifts weights with trainer Over past month left shoulder painful Hurts to sleep on left shoulder Hurts to stretch left arm behind plane of body Does not recall specific injury  ROS No real weakness No radicular sxs No neck pain  PE Pleasant BF in NAD BP (!) 113/55   Ht 5' 8.5" (1.74 m)   Wt 220 lb (99.8 kg)   LMP 03/27/2013   BMI 32.96 kg/m   Shoulder: Inspection reveals no abnormalities, atrophy or asymmetry. Palpation is normal with no tenderness over AC joint or bicipital groove. ROM is full in all planes. Rotator cuff strength normal throughout. No signs of impingement with negative Neer and Hawkin's tests, empty can. Speeds and Yergason's tests normal. No labral pathology noted with negative Obrien's, negative clunk and good stability. Normal scapular function observed. No painful arc and no drop arm sign. No apprehension sign  Pain is not elicited on RC testijng However TTP at insertion of left pec major to humerus Pain on resisted "fly"  Ultrasound of Left Shoulder  Supraspinatus tendon normal Infraspinatus and teres minor tendons normal Subscapularis tendon normal Biceps tendon normal Pectoralis major tendon shows a separation of fascial plane between muscle bellies disatlly with hypoechoic change Hypoechoic change throughout the pectoralis tendon footplate Mild DJD of the [place] joint  Impression: Pectoralis Major tendinopathy; mild osteoarthritis of AC Joine  Ultrasound and interpretation by Sibyl Parr. [name], MD

## 2018-03-19 NOTE — Patient Instructions (Signed)
Left shoulder Pectoralis Major tendinopathy Entire distal tendon is swollen on left  Rotator cuff looks great  Left AC joint shows mild arthritis  For Pec we actually want you to do limited flys Arm at 90 deg Arm at 45 deg 3 sets of 15  Internal rotation exercises Elbow at side 3 sets of 15  Do these exercises with light weight 3 to 5 lbs  With training avoid stress to pec. major - cannot do heavier resistance with flys,  Certain positions for other exercises - eg lat pull down behind head  Nitroglycerin Protocol   Apply 1/4 nitroglycerin patch to affected area daily.  Change position of patch within the affected area every 24 hours.  You may experience a headache during the first 1-2 weeks of using the patch, these should subside.  If you experience headaches after beginning nitroglycerin patch treatment, you may take your preferred over the counter pain reliever.  Another side effect of the nitroglycerin patch is skin irritation or rash related to patch adhesive.  Please notify our office if you develop more severe headaches or rash, and stop the patch.  Tendon healing with nitroglycerin patch may require 12 to 24 weeks depending on the extent of injury.  Men should not use if taking Viagra, Cialis, or Levitra.   Do not use if you have migraines or rosacea.   Come back in 2 months for me to repeat the [place] and exam

## 2018-03-19 NOTE — Assessment & Plan Note (Signed)
We will try NTG protocol  HEP  Avoid stress to Pec tendon in weight workout  Reck 6 weeks

## 2018-09-20 ENCOUNTER — Other Ambulatory Visit: Payer: Self-pay

## 2018-09-20 DIAGNOSIS — Z20822 Contact with and (suspected) exposure to covid-19: Secondary | ICD-10-CM

## 2018-09-20 DIAGNOSIS — R6889 Other general symptoms and signs: Secondary | ICD-10-CM | POA: Diagnosis not present

## 2018-09-22 LAB — NOVEL CORONAVIRUS, NAA: SARS-CoV-2, NAA: NOT DETECTED

## 2018-10-16 ENCOUNTER — Other Ambulatory Visit: Payer: Self-pay | Admitting: Family Medicine

## 2018-10-16 DIAGNOSIS — Z1231 Encounter for screening mammogram for malignant neoplasm of breast: Secondary | ICD-10-CM

## 2018-11-28 ENCOUNTER — Other Ambulatory Visit: Payer: Self-pay

## 2018-11-28 ENCOUNTER — Ambulatory Visit
Admission: RE | Admit: 2018-11-28 | Discharge: 2018-11-28 | Disposition: A | Discharge status: Home / Self Care | Payer: BC Managed Care – PPO | Source: Ambulatory Visit | Attending: Family Medicine | Admitting: Family Medicine

## 2018-11-28 DIAGNOSIS — Z1231 Encounter for screening mammogram for malignant neoplasm of breast: Secondary | ICD-10-CM

## 2018-12-13 DIAGNOSIS — Z0000 Encounter for general adult medical examination without abnormal findings: Secondary | ICD-10-CM | POA: Diagnosis not present

## 2018-12-27 DIAGNOSIS — Z13220 Encounter for screening for lipoid disorders: Secondary | ICD-10-CM | POA: Diagnosis not present

## 2018-12-27 DIAGNOSIS — Z833 Family history of diabetes mellitus: Secondary | ICD-10-CM | POA: Diagnosis not present

## 2018-12-27 DIAGNOSIS — Z0000 Encounter for general adult medical examination without abnormal findings: Secondary | ICD-10-CM | POA: Diagnosis not present

## 2019-02-10 ENCOUNTER — Ambulatory Visit: Payer: BC Managed Care – PPO | Attending: Internal Medicine

## 2019-02-10 DIAGNOSIS — Z20822 Contact with and (suspected) exposure to covid-19: Secondary | ICD-10-CM

## 2019-02-11 LAB — NOVEL CORONAVIRUS, NAA: SARS-CoV-2, NAA: NOT DETECTED

## 2019-02-24 ENCOUNTER — Other Ambulatory Visit: Payer: Self-pay

## 2019-02-24 ENCOUNTER — Emergency Department (HOSPITAL_COMMUNITY)
Admission: EM | Admit: 2019-02-24 | Discharge: 2019-02-24 | Disposition: A | Discharge status: Home / Self Care | Payer: BC Managed Care – PPO | Attending: Emergency Medicine | Admitting: Emergency Medicine

## 2019-02-24 ENCOUNTER — Emergency Department (HOSPITAL_COMMUNITY): Payer: BC Managed Care – PPO

## 2019-02-24 ENCOUNTER — Encounter (HOSPITAL_COMMUNITY): Payer: Self-pay

## 2019-02-24 DIAGNOSIS — Z23 Encounter for immunization: Secondary | ICD-10-CM | POA: Insufficient documentation

## 2019-02-24 DIAGNOSIS — W19XXXA Unspecified fall, initial encounter: Secondary | ICD-10-CM

## 2019-02-24 DIAGNOSIS — S0990XA Unspecified injury of head, initial encounter: Secondary | ICD-10-CM | POA: Diagnosis not present

## 2019-02-24 DIAGNOSIS — Y9373 Activity, racquet and hand sports: Secondary | ICD-10-CM | POA: Insufficient documentation

## 2019-02-24 DIAGNOSIS — M542 Cervicalgia: Secondary | ICD-10-CM | POA: Insufficient documentation

## 2019-02-24 DIAGNOSIS — M79601 Pain in right arm: Secondary | ICD-10-CM | POA: Diagnosis not present

## 2019-02-24 DIAGNOSIS — M4802 Spinal stenosis, cervical region: Secondary | ICD-10-CM | POA: Diagnosis not present

## 2019-02-24 DIAGNOSIS — Y999 Unspecified external cause status: Secondary | ICD-10-CM | POA: Diagnosis not present

## 2019-02-24 DIAGNOSIS — W1830XA Fall on same level, unspecified, initial encounter: Secondary | ICD-10-CM | POA: Diagnosis not present

## 2019-02-24 DIAGNOSIS — M2578 Osteophyte, vertebrae: Secondary | ICD-10-CM | POA: Diagnosis not present

## 2019-02-24 DIAGNOSIS — Y92312 Tennis court as the place of occurrence of the external cause: Secondary | ICD-10-CM | POA: Insufficient documentation

## 2019-02-24 DIAGNOSIS — R519 Headache, unspecified: Secondary | ICD-10-CM | POA: Diagnosis not present

## 2019-02-24 DIAGNOSIS — M79604 Pain in right leg: Secondary | ICD-10-CM | POA: Insufficient documentation

## 2019-02-24 DIAGNOSIS — M47812 Spondylosis without myelopathy or radiculopathy, cervical region: Secondary | ICD-10-CM | POA: Diagnosis not present

## 2019-02-24 DIAGNOSIS — S199XXA Unspecified injury of neck, initial encounter: Secondary | ICD-10-CM | POA: Diagnosis not present

## 2019-02-24 DIAGNOSIS — S060X0A Concussion without loss of consciousness, initial encounter: Secondary | ICD-10-CM

## 2019-02-24 MED ORDER — METHOCARBAMOL 500 MG PO TABS: 500.0000 mg | ORAL_TABLET | Freq: Two times a day (BID) | ORAL | 0 refills | Status: AC

## 2019-02-24 MED ORDER — TETANUS-DIPHTH-ACELL PERTUSSIS 5-2.5-18.5 LF-MCG/0.5 IM SUSP
0.5000 mL | Freq: Once | INTRAMUSCULAR | Status: AC
  Administered 2019-02-24: 0.5 mL via INTRAMUSCULAR
  Filled 2019-02-24: qty 0.5

## 2019-02-24 NOTE — ED Notes (Signed)
Pt verbalized understanding of discharge teaching. Pt ambulated out of ED, leaving by POV.

## 2019-02-24 NOTE — ED Triage Notes (Signed)
Pt reports a fall while playing tennis today, pt hit the right side of her head on the court, no LOC. Pt does report feeling "cloudy headed" Pt a.o, ambulatory without difficulty. Takes 325 ASA daily, no other blood thinners.

## 2019-02-24 NOTE — Discharge Instructions (Signed)
You have been diagnosed today with fall, head injury, concussion.  At this time there does not appear to be the presence of an emergent medical condition, however there is always the potential for conditions to change. Please read and follow the below instructions.  Please return to the Emergency Department immediately for any new or worsening symptoms. Please be sure to follow up with your Primary Care Provider within one week regarding your visit today; please call their office to schedule an appointment even if you are feeling better for a follow-up visit. You may call the concussion specialist Dr. Katrinka Blazing on your discharge paperwork as needed for follow-up for your possible concussion today.  Please drink plenty water and get plenty of rest. Your CT scan today showed multilevel degenerative changes of your spine which are likely due to age.  Please discuss these incidental findings with your primary care provider at your follow-up visit this week. You may use the muscle relaxer Robaxin as prescribed to help with your symptoms.  Do not drive or operate heavy machinery while taking Robaxin as it will make you drowsy.  Do not drink alcohol or take other sedating medications while taking Robaxin as this will worsen side effects.  Get help right away if: You have: A very bad headache that is not helped by medicine. Trouble walking or weakness in your arms and legs. Clear or bloody fluid coming from your nose or ears. Changes in how you see (vision). Shaking movements that you cannot control. You lose your balance. You vomit. The black centers of your eyes (pupils) change in size. Your speech is slurred. Your dizziness gets worse. You pass out. You are sleepier than normal and have trouble staying awake. You have any new/concerning or worsening of symptoms  Please read the additional information packets attached to your discharge summary.  Do not take your medicine if  develop an itchy rash,  swelling in your mouth or lips, or difficulty breathing; call 911 and seek immediate emergency medical attention if this occurs.  Note: Portions of this text may have been transcribed using voice recognition software. Every effort was made to ensure accuracy; however, inadvertent computerized transcription errors may still be present.

## 2019-02-24 NOTE — ED Provider Notes (Signed)
[facility] EMERGENCY DEPARTMENT Provider Note   CSN: [id number] Arrival date & time: 02/24/19  1403     History Chief Complaint  Patient presents with  . Fall    Sharon [name] is a 54 y.o. female with history of asthma, GERD.  Patient presents today following a fall while playing tennis approximately 1 hour prior to arrival.  She reports that she was running forward when she tripped falling and striking the right side of her head on the ground.  She denies loss of consciousness, does take aspirin daily no other blood thinner use.  She reports that she was able to get up off the ground without assistance.  She reports that since the fall she has developed a mild right-sided headache nonradiating no aggravating or alleviating factors, throbbing in nature.  Additionally she reports some right-sided neck pain that she described as a mild aching sensation worsened with palpation improved with rest, nonradiating.  She reports that she feels like she is in a fog since her fall.  Additionally patient reports mild right arm and leg aching sensation as she fell on her right side she denies any decreased range of motion with these extremities reports that the aching has gradually improved since her fall.  Denies loss of consciousness, numbness/tingling, weakness, vision changes, back pain, chest pain, abdominal pain, pelvic pain, shortness of breath, nausea/vomiting or any additional concerns. HPI     Past Medical History:  Diagnosis Date  . Exertional asthma   . GERD (gastroesophageal reflux disease)   . Right leg pain     Patient Active Problem List   Diagnosis Date Noted  . Left anterior shoulder pain 03/19/2018  . Right leg pain 01/14/2018  . Lower extremity pain 05/14/2013  . Paresthesia 03/27/2013  . Neck pain 03/27/2013  . Low back pain radiating to left leg 03/06/2011  . ELBOW PAIN, RIGHT 05/07/2009  . Right lateral epicondylitis 05/07/2009    Past Surgical  History:  Procedure Laterality Date  . CESAREAN SECTION    . LAPAROSCOPY       OB History   No obstetric history on file.     Family History  Problem Relation Age of Onset  . Diabetes Father   . Lung cancer Father   . High Cholesterol Mother     Social History   Tobacco Use  . Smoking status: Never Smoker  . Smokeless tobacco: Never Used  Substance Use Topics  . Alcohol use: Yes    Comment: rare, social  . Drug use: No    Home Medications Prior to Admission medications   Medication Sig Start Date End Date Taking? Authorizing Provider  amitriptyline (ELAVIL) 25 MG tablet Take 1 tablet (25 mg total) by mouth at bedtime. 12/26/11   Enid Baas, MD  meloxicam (MOBIC) 15 MG tablet 1 tablet daily as needed. 03/25/13   [provider]  nitroGLYCERIN (NITRODUR - DOSED IN MG/24 HR) 0.2 mg/hr patch Use 1/4 patch daily to the affected area. 03/19/18   Enid Baas, MD  pantoprazole (PROTONIX) 40 MG tablet 1 tablet daily as needed. 02/18/13   [provider]    Allergies    Patient has no known allergies.  Review of Systems   Review of Systems Ten systems are reviewed and are negative for acute change except as noted in the HPI  Physical Exam Updated Vital Signs BP (!) 147/81   Pulse 79   Temp 98.1 F (36.7 C) (Oral)   Resp  18   LMP 03/27/2013   SpO2 100%   Physical Exam Constitutional:      General: She is not in acute distress.    Appearance: Normal appearance. She is well-developed. She is not ill-appearing or diaphoretic.  HENT:     Head: Normocephalic and atraumatic.     Jaw: There is normal jaw occlusion. No trismus.     Right Ear: Tympanic membrane and external ear normal.     Left Ear: Tympanic membrane and external ear normal.     Nose: Nose normal. No rhinorrhea.     Right Nostril: No epistaxis.     Left Nostril: No epistaxis.     Mouth/Throat:     Mouth: Mucous membranes are moist.     Pharynx: Oropharynx is clear.  Eyes:     General:  Vision grossly intact. Gaze aligned appropriately.     Extraocular Movements: Extraocular movements intact.     Conjunctiva/sclera: Conjunctivae normal.     Pupils: Pupils are equal, round, and reactive to light.     Comments: Visual fields grossly intact bilaterally No pain with EOM  Neck:     Trachea: Trachea and phonation normal. No tracheal deviation.   Cardiovascular:     Rate and Rhythm: Normal rate and regular rhythm.     Pulses:          Radial pulses are 2+ on the right side and 2+ on the left side.       Dorsalis pedis pulses are 2+ on the right side and 2+ on the left side.     Heart sounds: Normal heart sounds.  Pulmonary:     Effort: Pulmonary effort is normal. No accessory muscle usage or respiratory distress.     Breath sounds: Normal breath sounds and air entry.  Chest:     Chest wall: No tenderness.  Abdominal:     General: There is no distension.     Palpations: Abdomen is soft.     Tenderness: There is no abdominal tenderness. There is no guarding or rebound.  Musculoskeletal:        General: Normal range of motion.     Cervical back: Normal range of motion and neck supple. Muscular tenderness present. No spinous process tenderness.     Comments: No midline C/T/L spinal tenderness to palpation, no deformity, crepitus, or step-off noted. No sign of injury to the neck or back. - Mild right trapezius muscular tenderness to palpation without overlying skin change. - All major joints of the upper and lower extremities mobilized with appropriate range of motion and strength without deformity or increased pain.  No overlying skin changes.  Feet:     Right foot:     Protective Sensation: 5 sites tested. 5 sites sensed.     Left foot:     Protective Sensation: 5 sites tested. 5 sites sensed.  Skin:    General: Skin is warm and dry.  Neurological:     Mental Status: She is alert.     GCS: GCS eye subscore is 4. GCS verbal subscore is 5. GCS motor subscore is 6.      Comments: Speech is clear and goal oriented, follows commands Major Cranial nerves without deficit, no facial droop Normal strength in upper and lower extremities bilaterally including dorsiflexion and plantar flexion, strong and equal grip strength Sensation normal to light and sharp touch Moves extremities without ataxia, coordination intact Normal finger to nose and rapid alternating movements Neg romberg, no pronator drift  Normal gait  Psychiatric:        Behavior: Behavior normal.    ED Results / Procedures / Treatments   Labs (all labs ordered are listed, but only abnormal results are displayed) Labs Reviewed - No data to display  EKG None  Radiology CT Head Wo Contrast  Result Date: 02/24/2019 CLINICAL DATA:  54 year old female with fall and headache. EXAM: CT HEAD WITHOUT CONTRAST CT CERVICAL SPINE WITHOUT CONTRAST TECHNIQUE: Multidetector CT imaging of the head and cervical spine was performed following the standard protocol without intravenous contrast. Multiplanar CT image reconstructions of the cervical spine were also generated. COMPARISON:  None. FINDINGS: CT HEAD FINDINGS Brain: The ventricles and sulci appropriate size for patient's age. The gray-white matter discrimination is preserved. There is no acute intracranial hemorrhage. No mass effect or midline shift. No extra-axial fluid collection. Vascular: No hyperdense vessel or unexpected calcification. Skull: Normal. Negative for fracture or focal lesion. Sinuses/Orbits: No acute finding. Other: None CT CERVICAL SPINE FINDINGS Alignment: The no acute subluxation. There is mild reversal of normal cervical lordosis which may be positional or due to muscle spasm. Skull base and vertebrae: No acute fracture Soft tissues and spinal canal: No prevertebral fluid or swelling. No visible canal hematoma. Disc levels: Multilevel degenerative changes with endplate irregularity and disc space narrowing and mild osteophyte. Upper chest:  Negative. Other: None IMPRESSION: 1. No acute intracranial hemorrhage. 2. No acute/traumatic cervical spine pathology. Electronically Signed   By: Elgie Collard M.D.   On: 02/24/2019 17:36   CT Cervical Spine Wo Contrast  Result Date: 02/24/2019 CLINICAL DATA:  54 year old female with fall and headache. EXAM: CT HEAD WITHOUT CONTRAST CT CERVICAL SPINE WITHOUT CONTRAST TECHNIQUE: Multidetector CT imaging of the head and cervical spine was performed following the standard protocol without intravenous contrast. Multiplanar CT image reconstructions of the cervical spine were also generated. COMPARISON:  None. FINDINGS: CT HEAD FINDINGS Brain: The ventricles and sulci appropriate size for patient's age. The gray-white matter discrimination is preserved. There is no acute intracranial hemorrhage. No mass effect or midline shift. No extra-axial fluid collection. Vascular: No hyperdense vessel or unexpected calcification. Skull: Normal. Negative for fracture or focal lesion. Sinuses/Orbits: No acute finding. Other: None CT CERVICAL SPINE FINDINGS Alignment: The no acute subluxation. There is mild reversal of normal cervical lordosis which may be positional or due to muscle spasm. Skull base and vertebrae: No acute fracture Soft tissues and spinal canal: No prevertebral fluid or swelling. No visible canal hematoma. Disc levels: Multilevel degenerative changes with endplate irregularity and disc space narrowing and mild osteophyte. Upper chest: Negative. Other: None IMPRESSION: 1. No acute intracranial hemorrhage. 2. No acute/traumatic cervical spine pathology. Electronically Signed   By: Elgie Collard M.D.   On: 02/24/2019 17:36    Procedures Procedures (including critical care time)  Medications Ordered in ED Medications  Tdap (BOOSTRIX) injection 0.5 mL (has no administration in time range)    ED Course  I have reviewed the triage vital signs and the nursing notes.  Pertinent labs & imaging results  that were available during my care of the patient were reviewed by me and considered in my medical decision making (see chart for details).    MDM Rules/Calculators/A&P                     54 year old female presents today after a fall while playing tennis, she struck the right side of her head on the ground, did not lose consciousness but has  since developed a headache and a foggy headedness.  She is concerned for intracranial bleeding as she takes aspirin daily.  Additionally she has some right trapezius muscular tenderness without overlying skin change.  There is no midline tenderness, step-off, crepitus or deformity.  She has no pain of the chest, abdomen or pelvis.  She reports mild aching sensation of her right arm and leg as she fell onto her right side, she has full range of motion and strength with all major joints of the upper and lower extremities bilaterally and no overlying skin changes.  She is neurovascular intact to all 4 extremities.  She does not want any x-rays performed of her extremities which I feel is reasonable as she has good strength and range of motion doubt fracture at this time.  Will obtain CT of the head and cervical spine as patient is concerned for possible bleeding and injury.  Suspect patient may have a concussion today.  Additionally she is requesting an update of her Tdap she reports greater than 10 years since she last obtain 1.  She has no wound or skin break, she has some chalk from the tennis court on her shoulder and lower leg.  Since patient is out of date to feel it is reasonable at this time to update her Tdap. - CT head/cervical spine:  IMPRESSION:  1. No acute intracranial hemorrhage.  2. No acute/traumatic cervical spine pathology.  - Patient reassessed resting comfortably using her phone with both hands requesting to eat food.  C-collar removed.  She states understanding of findings as above and has no questions.  She is requesting discharge.  Will provide  patient prescription for Robaxin 500 mg twice daily for muscular soreness after her fall.  She states understanding of muscle relaxer precautions.  Additionally she will be given handout regarding concussions and advised of concussion precautions and referred to concussion clinic as needed.  At this time there does not appear to be any evidence of an acute emergency medical condition and the patient appears stable for discharge with appropriate outpatient follow up. Diagnosis was discussed with patient who verbalizes understanding of care plan and is agreeable to discharge. I have discussed return precautions with patient who verbalizes understanding of return precautions. Patient encouraged to follow-up with their PCP. All questions answered. Patient has been discharged in good condition.  Note: Portions of this report may have been transcribed using voice recognition software. Every effort was made to ensure accuracy; however, inadvertent computerized transcription errors may still be present. Final Clinical Impression(s) / ED Diagnoses Final diagnoses:  Fall, initial encounter  Injury of head, initial encounter  Concussion without loss of consciousness, initial encounter    Rx / DC Orders ED Discharge Orders    None       Gari Crown 02/24/19 1916    Fredia Sorrow, MD 03/02/19 [phone]

## 2019-05-13 ENCOUNTER — Encounter (HOSPITAL_COMMUNITY): Payer: Self-pay | Admitting: Emergency Medicine

## 2019-05-13 ENCOUNTER — Emergency Department (HOSPITAL_COMMUNITY)
Admission: EM | Admit: 2019-05-13 | Discharge: 2019-05-13 | Disposition: A | Discharge status: Home / Self Care | Payer: BC Managed Care – PPO | Attending: Emergency Medicine | Admitting: Emergency Medicine

## 2019-05-13 ENCOUNTER — Emergency Department (HOSPITAL_COMMUNITY): Payer: BC Managed Care – PPO

## 2019-05-13 ENCOUNTER — Other Ambulatory Visit: Payer: Self-pay

## 2019-05-13 DIAGNOSIS — R079 Chest pain, unspecified: Secondary | ICD-10-CM

## 2019-05-13 DIAGNOSIS — R0789 Other chest pain: Secondary | ICD-10-CM | POA: Insufficient documentation

## 2019-05-13 DIAGNOSIS — M79602 Pain in left arm: Secondary | ICD-10-CM | POA: Insufficient documentation

## 2019-05-13 LAB — CBC
HCT: 42.1 % (ref 36.0–46.0)
Hemoglobin: 13.4 g/dL (ref 12.0–15.0)
MCH: 28.4 pg (ref 26.0–34.0)
MCHC: 31.8 g/dL (ref 30.0–36.0)
MCV: 89.2 fL (ref 80.0–100.0)
Platelets: 251 10*3/uL (ref 150–400)
RBC: 4.72 MIL/uL (ref 3.87–5.11)
RDW: 13.7 % (ref 11.5–15.5)
WBC: 5.5 10*3/uL (ref 4.0–10.5)
nRBC: 0 % (ref 0.0–0.2)

## 2019-05-13 LAB — BASIC METABOLIC PANEL
Anion gap: 9 (ref 5–15)
BUN: 18 mg/dL (ref 6–20)
CO2: 27 mmol/L (ref 22–32)
Calcium: 9.4 mg/dL (ref 8.9–10.3)
Chloride: 105 mmol/L (ref 98–111)
Creatinine, Ser: 0.92 mg/dL (ref 0.44–1.00)
GFR calc Af Amer: >60 mL/min (ref >60)
GFR calc non Af Amer: >60 mL/min (ref >60)
Glucose, Bld: 106 mg/dL — ABNORMAL HIGH (ref 70–99)
Potassium: 4.5 mmol/L (ref 3.5–5.1)
Sodium: 141 mmol/L (ref 135–145)

## 2019-05-13 LAB — TROPONIN I (HIGH SENSITIVITY)
Troponin I (High Sensitivity): 5 ng/L (ref <18)
Troponin I (High Sensitivity): 3 ng/L (ref <18)

## 2019-05-13 NOTE — ED Triage Notes (Signed)
Pt endorses left arm pain that woke her up last night around 12:30. Intermittent pain in left side of chest.

## 2019-05-13 NOTE — ED Provider Notes (Signed)
[facility] EMERGENCY DEPARTMENT Provider Note   CSN: [id number] Arrival date & time: 05/13/19  1696     History Chief Complaint  Patient presents with  . Chest Pain    Sharon [name] is a 54 y.o. female.  HPI 54 year old female without significant medical history presents to the ER for left wrist and arm pain that woke her up in the middle of the night around 12:30 AM accompanied with left sided upper chest discomfort.  Patient states that she woke up from the pain in her left arm, described as a throbbing and pressure, denies numbness or tingling.  She states she has not had pain in her left arm like this before.  Patient was concerned as the pain continued on and did not subside for several hours.  She denies central chest pain, pain radiating to the back, shortness of breath, diaphoresis, syncope, cough, fever, low back pain.  Denies any injury to her left arm, no history of carpal tunnel.  She does not have any personal cardiac history, but states that she does have a history of ACS and her mom side of the family.  She does not smoke or use alcohol.  She does not have any chest pain, left arm pain, or any other complaints in the ER currently at this time.  She maintains that her symptoms have resolved.    Past Medical History:  Diagnosis Date  . Exertional asthma   . GERD (gastroesophageal reflux disease)   . Right leg pain     Patient Active Problem List   Diagnosis Date Noted  . Left anterior shoulder pain 03/19/2018  . Right leg pain 01/14/2018  . Lower extremity pain 05/14/2013  . Paresthesia 03/27/2013  . Neck pain 03/27/2013  . Low back pain radiating to left leg 03/06/2011  . ELBOW PAIN, RIGHT 05/07/2009  . Right lateral epicondylitis 05/07/2009    Past Surgical History:  Procedure Laterality Date  . CESAREAN SECTION    . LAPAROSCOPY       OB History   No obstetric history on file.     Family History  Problem Relation Age of Onset  .  Diabetes Father   . Lung cancer Father   . High Cholesterol Mother     Social History   Tobacco Use  . Smoking status: Never Smoker  . Smokeless tobacco: Never Used  Substance Use Topics  . Alcohol use: Yes    Comment: rare, social  . Drug use: No    Home Medications Prior to Admission medications   Medication Sig Start Date End Date Taking? Authorizing Provider  amitriptyline (ELAVIL) 25 MG tablet Take 1 tablet (25 mg total) by mouth at bedtime. 12/26/11   Enid Baas, MD  meloxicam (MOBIC) 15 MG tablet 1 tablet daily as needed. 03/25/13   [provider]  nitroGLYCERIN (NITRODUR - DOSED IN MG/24 HR) 0.2 mg/hr patch Use 1/4 patch daily to the affected area. 03/19/18   Enid Baas, MD  pantoprazole (PROTONIX) 40 MG tablet 1 tablet daily as needed. 02/18/13   [provider]    Allergies    Patient has no known allergies.  Review of Systems   Review of Systems  Constitutional: Negative for chills and fever.  Eyes: Negative for pain and visual disturbance.  Respiratory: Negative for cough and shortness of breath.   Cardiovascular: Negative for chest pain and palpitations.  Gastrointestinal: Negative for abdominal pain, nausea and vomiting.  Genitourinary: Negative for dysuria, flank  pain and hematuria.  Musculoskeletal: Negative for arthralgias, back pain and myalgias.  Skin: Negative for color change and rash.  Neurological: Negative for dizziness, seizures, syncope, weakness and headaches.  Psychiatric/Behavioral: Negative for confusion.  All other systems reviewed and are negative.   Physical Exam Updated Vital Signs BP (!) 120/56 (BP Location: Right Arm)   Pulse 62   Temp 98.4 F (36.9 C) (Oral)   Resp 19   Ht 5\' 9"  (1.753 m)   Wt 105.2 kg   LMP 03/27/2013   SpO2 98%   BMI 34.26 kg/m   Physical Exam Vitals and nursing note reviewed.  Constitutional:      General: She is not in acute distress.    Appearance: She is well-developed. She is  not ill-appearing, toxic-appearing or diaphoretic.  HENT:     Head: Normocephalic and atraumatic.  Eyes:     Extraocular Movements: Extraocular movements intact.     Conjunctiva/sclera: Conjunctivae normal.     Pupils: Pupils are equal, round, and reactive to light.  Neck:     Vascular: No JVD.  Cardiovascular:     Rate and Rhythm: Normal rate and regular rhythm.     Pulses:          Radial pulses are 2+ on the right side and 2+ on the left side.       Dorsalis pedis pulses are 1+ on the right side and 2+ on the left side.     Heart sounds: Normal heart sounds. No murmur.  Pulmonary:     Effort: Pulmonary effort is normal. No tachypnea, accessory muscle usage or respiratory distress.     Breath sounds: Normal breath sounds.  Chest:     Chest wall: No tenderness, crepitus or edema.     Comments: No reproducible chest wall tenderness.  No visible deformities, rashes, bruises. Abdominal:     Palpations: Abdomen is soft.     Tenderness: There is no abdominal tenderness.  Musculoskeletal:        General: Normal range of motion.     Cervical back: Normal range of motion and neck supple.     Comments: No tenderness to palpation to left wrist/arm.  Full range of motion, sensations intact.  Skin:    General: Skin is warm and dry.  Neurological:     Mental Status: She is alert.     ED Results / Procedures / Treatments   Labs (all labs ordered are listed, but only abnormal results are displayed) Labs Reviewed  BASIC METABOLIC PANEL - Abnormal; Notable for the following components:      Result Value   Glucose, Bld 106 (*)    All other components within normal limits  CBC  I-STAT BETA HCG BLOOD, ED ([facility], [facility], [facility] ONLY)  TROPONIN I (HIGH SENSITIVITY)  TROPONIN I (HIGH SENSITIVITY)    EKG None  Radiology DG Chest 2 View  Result Date: 05/13/2019 CLINICAL DATA:  Onset left arm pain last night which woke the patient up. Intermittent left chest pain. EXAM: CHEST - 2 VIEW COMPARISON:   None. FINDINGS: Lungs clear. Heart size normal. Atherosclerosis noted. No pneumothorax or pleural fluid. No acute or focal bony abnormality. IMPRESSION: No acute disease. Atherosclerosis. Electronically Signed   By: Inge Rise M.D.   On: 05/13/2019 09:04    Procedures Procedures (including critical care time)  Medications Ordered in ED Medications - No data to display  ED Course  I have reviewed the triage vital signs and the nursing notes.  Pertinent labs & imaging results that were available during my care of the patient were reviewed by me and considered in my medical decision making (see chart for details).    MDM Rules/Calculators/A&P                     54 year old female with left arm pain, left upper chest discomfort that woke her up around 1230 this morning. On presentation here, patient is resting comfortably in the ER bed, nontoxic-appearing, no acute distress, non diaphoretic, with no increased work of breathing.  She states that she has no pain in her chest or left arm at this time.  Physical exam and vitals nonconcerning.  CBC without leukocytosis, normal hemoglobin.  Chest x-ray without acute cardiopulmonary process.  EKG normal sinus rhythm.  BMP without significant  abnormalities, or renal dysfunction.  Serial troponins unchanged.  Low suspicion for ACS, PE, infectious causes, disc herniation.  Patient does not have any history of cardiac problems, denies back pain.  Patient does not have any history of carpal tunnel and does not have any numbness or tingling in her left arm.  Possible musculoskeletal cause/sleeping correctly on the left arm.  Overall work-up reassuring.  I discussed the findings with the patient, she states that she is aware that she has high cholesterol which caused her to worry.  I reassured the patient that her work-up today in the ER was normal.  I encouraged her to follow-up with her primary care provider for further management of her cholesterol.  At  this stage in the ED course, patient has been medically screened and is stable for discharge.  Patient is reassured by the work-up, voices understanding and is agreeable to this plan.  Return precautions given. Final Clinical Impression(s) / ED Diagnoses Final diagnoses:  Chest pain, unspecified type  Left arm pain    Rx / DC Orders ED Discharge Orders    None       Leone Brand 05/13/19 1222    Mancel Bale, MD 05/14/19 1112

## 2019-05-13 NOTE — Discharge Instructions (Addendum)
You were seen in the ER for left arm pain and chest discomfort.  Your EKG, chest x-ray, lab work all came back negative for heart attack.  You are now not having any symptoms in the ER, so suspicion for anything life-threatening is low.  Please follow-up with your primary care doctor about your concern about your cholesterol levels.  Return to the ER for symptoms worsen.

## 2019-10-13 DIAGNOSIS — N951 Menopausal and female climacteric states: Secondary | ICD-10-CM | POA: Diagnosis not present

## 2019-10-13 DIAGNOSIS — Z6834 Body mass index (BMI) 34.0-34.9, adult: Secondary | ICD-10-CM | POA: Diagnosis not present

## 2019-10-13 DIAGNOSIS — Z01419 Encounter for gynecological examination (general) (routine) without abnormal findings: Secondary | ICD-10-CM | POA: Diagnosis not present

## 2019-10-13 DIAGNOSIS — Z1239 Encounter for other screening for malignant neoplasm of breast: Secondary | ICD-10-CM | POA: Diagnosis not present

## 2019-10-30 ENCOUNTER — Encounter (INDEPENDENT_AMBULATORY_CARE_PROVIDER_SITE_OTHER): Payer: Self-pay | Admitting: Family Medicine

## 2019-10-30 ENCOUNTER — Ambulatory Visit (INDEPENDENT_AMBULATORY_CARE_PROVIDER_SITE_OTHER): Payer: BC Managed Care – PPO | Admitting: Family Medicine

## 2019-10-30 ENCOUNTER — Other Ambulatory Visit: Payer: Self-pay

## 2019-10-30 VITALS — BP 125/80 | HR 65 | Temp 98.7°F | Ht 69.0 in | Wt 228.0 lb

## 2019-10-30 DIAGNOSIS — M5136 Other intervertebral disc degeneration, lumbar region: Secondary | ICD-10-CM

## 2019-10-30 DIAGNOSIS — Z6833 Body mass index (BMI) 33.0-33.9, adult: Secondary | ICD-10-CM

## 2019-10-30 DIAGNOSIS — Z1331 Encounter for screening for depression: Secondary | ICD-10-CM | POA: Diagnosis not present

## 2019-10-30 DIAGNOSIS — R5383 Other fatigue: Secondary | ICD-10-CM | POA: Diagnosis not present

## 2019-10-30 DIAGNOSIS — Z9189 Other specified personal risk factors, not elsewhere classified: Secondary | ICD-10-CM | POA: Diagnosis not present

## 2019-10-30 DIAGNOSIS — G4701 Insomnia due to medical condition: Secondary | ICD-10-CM

## 2019-10-30 DIAGNOSIS — D649 Anemia, unspecified: Secondary | ICD-10-CM

## 2019-10-30 DIAGNOSIS — R0602 Shortness of breath: Secondary | ICD-10-CM | POA: Diagnosis not present

## 2019-10-30 DIAGNOSIS — Z0289 Encounter for other administrative examinations: Secondary | ICD-10-CM

## 2019-10-30 DIAGNOSIS — E559 Vitamin D deficiency, unspecified: Secondary | ICD-10-CM | POA: Diagnosis not present

## 2019-10-30 DIAGNOSIS — E785 Hyperlipidemia, unspecified: Secondary | ICD-10-CM

## 2019-10-30 DIAGNOSIS — E669 Obesity, unspecified: Secondary | ICD-10-CM

## 2019-10-30 DIAGNOSIS — J45909 Unspecified asthma, uncomplicated: Secondary | ICD-10-CM

## 2019-10-31 LAB — COMPREHENSIVE METABOLIC PANEL
ALT: 59 IU/L — ABNORMAL HIGH (ref 0–32)
AST: 105 IU/L — ABNORMAL HIGH (ref 0–40)
Albumin/Globulin Ratio: 1.6 (ref 1.2–2.2)
Albumin: 4.5 g/dL (ref 3.8–4.9)
Alkaline Phosphatase: 66 IU/L (ref 44–121)
BUN/Creatinine Ratio: 16 (ref 9–23)
BUN: 14 mg/dL (ref 6–24)
Bilirubin Total: 0.4 mg/dL (ref 0.0–1.2)
CO2: 27 mmol/L (ref 20–29)
Calcium: 9.9 mg/dL (ref 8.7–10.2)
Chloride: 101 mmol/L (ref 96–106)
Creatinine, Ser: 0.85 mg/dL (ref 0.57–1.00)
GFR calc Af Amer: 90 mL/min/{1.73_m2} (ref >59)
GFR calc non Af Amer: 78 mL/min/{1.73_m2} (ref >59)
Globulin, Total: 2.9 g/dL (ref 1.5–4.5)
Glucose: 99 mg/dL (ref 65–99)
Potassium: 4.9 mmol/L (ref 3.5–5.2)
Sodium: 139 mmol/L (ref 134–144)
Total Protein: 7.4 g/dL (ref 6.0–8.5)

## 2019-10-31 LAB — LIPID PANEL WITH LDL/HDL RATIO
Cholesterol, Total: 228 mg/dL — ABNORMAL HIGH (ref 100–199)
HDL: 67 mg/dL (ref >39)
LDL Chol Calc (NIH): 140 mg/dL — ABNORMAL HIGH (ref 0–99)
LDL/HDL Ratio: 2.1 ratio (ref 0.0–3.2)
Triglycerides: 119 mg/dL (ref 0–149)
VLDL Cholesterol Cal: 21 mg/dL (ref 5–40)

## 2019-10-31 LAB — TSH: TSH: 1.57 u[IU]/mL (ref 0.450–4.500)

## 2019-10-31 LAB — CBC WITH DIFFERENTIAL/PLATELET
Basophils Absolute: 0 10*3/uL (ref 0.0–0.2)
Basos: 1 %
EOS (ABSOLUTE): 0.2 10*3/uL (ref 0.0–0.4)
Eos: 5 %
Hematocrit: 39.4 % (ref 34.0–46.6)
Hemoglobin: 12.9 g/dL (ref 11.1–15.9)
Immature Grans (Abs): 0 10*3/uL (ref 0.0–0.1)
Immature Granulocytes: 0 %
Lymphocytes Absolute: 1.9 10*3/uL (ref 0.7–3.1)
Lymphs: 38 %
MCH: 28.1 pg (ref 26.6–33.0)
MCHC: 32.7 g/dL (ref 31.5–35.7)
MCV: 86 fL (ref 79–97)
Monocytes Absolute: 0.4 10*3/uL (ref 0.1–0.9)
Monocytes: 8 %
Neutrophils Absolute: 2.5 10*3/uL (ref 1.4–7.0)
Neutrophils: 48 %
Platelets: 245 10*3/uL (ref 150–450)
RBC: 4.59 x10E6/uL (ref 3.77–5.28)
RDW: 14.5 % (ref 11.7–15.4)
WBC: 5 10*3/uL (ref 3.4–10.8)

## 2019-10-31 LAB — T3: T3, Total: 114 ng/dL (ref 71–180)

## 2019-10-31 LAB — VITAMIN D 25 HYDROXY (VIT D DEFICIENCY, FRACTURES): Vit D, 25-Hydroxy: 40.6 ng/mL (ref 30.0–100.0)

## 2019-10-31 LAB — T4, FREE: Free T4: 1.26 ng/dL (ref 0.82–1.77)

## 2019-10-31 LAB — FOLATE: Folate: 6.8 ng/mL (ref >3.0)

## 2019-10-31 LAB — HEMOGLOBIN A1C
Est. average glucose Bld gHb Est-mCnc: 128 mg/dL
Hgb A1c MFr Bld: 6.1 % — ABNORMAL HIGH (ref 4.8–5.6)

## 2019-10-31 LAB — VITAMIN B12: Vitamin B-12: 568 pg/mL (ref 232–1245)

## 2019-10-31 LAB — INSULIN, RANDOM: INSULIN: 20.2 u[IU]/mL (ref 2.6–24.9)

## 2019-11-03 NOTE — Progress Notes (Signed)
Dear Dr. Estanislado [name],   Thank you for referring Sharon [name] to our clinic. The following note includes my evaluation and treatment recommendations.  Chief Complaint:   OBESITY Sharon [name] (MR# [id number]) is a 54 y.o. female who presents for evaluation and treatment of obesity and related comorbidities. Current BMI is Body mass index is 33.67 kg/m. Sharon [name] has been struggling with her weight for many years and has been unsuccessful in either losing weight, maintaining weight loss, or reaching her healthy weight goal.  Sharon [name] is currently in the action stage of change and ready to dedicate time achieving and maintaining a healthier weight. Sharon [name] is interested in becoming our patient and working on intensive lifestyle modifications including (but not limited to) diet and exercise for weight loss.  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  She is married to her husband, Sharon [name], age 54.  She skips breakfast.  She says she dislikes eggs and fish.  She likes/craves pizza, carbs, and candy.  She says her weight increased at age 54 with IVF treatments.  She eats out 5 days per week on average.  Sharon [name]'s habits were reviewed today and are as follows: she thinks her family will eat healthier with her, her desired weight loss is 72 pounds, she started gaining weight around age 54 after having IVF, her heaviest weight ever was 238 pounds, she craves carbs, candy, and pizza, she snacks frequently in the evenings, she skips breakfast frequently, she frequently makes poor food choices, she frequently eats larger portions than normal and she struggles with emotional eating.  This is the patient's first visit at [facility].  The patient's NEW PATIENT PACKET that they filled out prior to today's office visit was reviewed at length and some information from that paperwork was also included within the following office visit note.    Included in the packet: current and past  health history, medications, allergies, ROS, gynecologic history (women only), surgical history, family history, social history, weight history, weight loss surgery history (for those that have had weight loss surgery), nutritional evaluation, mood and food questionnaire along with a depression screening (PHQ9) on all patients, an Epworth questionnaire, sleep habits questionnaire, patient life and health improvement goals questionnaire. These will all be scanned into the patient's chart under media.   During the visit, I independently reviewed the patient's EKG, bioimpedance scale results, and indirect calorimeter results. I used this information to tailor a meal plan for the patient that will help Sharon [name] to lose weight and will improve her obesity-related conditions going forward. I performed a medically necessary appropriate examination and/or evaluation. I discussed the assessment and treatment plan with the patient. The patient was provided an opportunity to ask questions and all were answered. The patient agreed with the plan and demonstrated an understanding of the instructions. Labs were ordered at this visit and will be reviewed at the next visit unless more critical results need to be addressed immediately. Clinical information was updated and documented in the EMR.   Time spent on visit including pre-visit chart review and post-visit care was estimated to be 60-74 minutes.  A separate 15 minutes was spent on risk counseling (see above/below).   Depression Screen Sharon [name]'s Food and Mood (modified PHQ-9) score was 3.  Depression screen PHQ 2/9 10/30/2019  Decreased Interest 0  Down, Depressed, Hopeless 1  PHQ - 2 Score 1  Altered sleeping 0  Tired,  decreased energy 1  Change in appetite 1  Feeling bad or failure about yourself  0  Trouble concentrating 0  Moving slowly or fidgety/restless 0  Suicidal thoughts 0  PHQ-9 Score 3   Assessment/Plan:   1. Other fatigue Sharon [name] denies daytime  somnolence and reports waking up still tired. Patent has a history of symptoms of morning fatigue and snoring. Sharon [name] generally gets 6 hours of sleep per night, and states that she has poor quality sleep. Snoring is present. Apneic episodes are not present. Epworth Sleepiness Score is 4.  Sharon [name] does not feel that her weight is causing her energy to be lower than it should be. Fatigue may be related to obesity, depression or many other causes. Labs will be ordered, and in the meanwhile, Sharon [name] will focus on self care including making healthy food choices, increasing physical activity and focusing on stress reduction.  - Vitamin B12 - Folate - Insulin, random - T3 - T4, free - TSH - Hemoglobin A1c  2. Shortness of breath on exertion Sharon [name] notes increasing shortness of breath with exercising and seems to be worsening over time with weight gain. She notes getting out of breath sooner with activity than she used to. This has gotten worse recently. Sharon [name] denies shortness of breath at rest or orthopnea.  Sharon [name] does feel that she gets out of breath more easily that she used to when she exercises. Nemesis's shortness of breath appears to be obesity related and exercise induced. She has agreed to work on weight loss and gradually increase exercise to treat her exercise induced shortness of breath. Will continue to monitor closely.  - Comprehensive metabolic panel  3. Anemia, unspecified type Sharon [name] has a history of iron deficiency anemia.  She is not a vegetarian.  She does not have a history of weight loss surgery.  She is not taking an iron supplement.  CBC Latest Ref Rng & Units 10/30/2019 05/13/2019  WBC 3.4 - 10.8 x10E3/uL 5.0 5.5  Hemoglobin 11.1 - 15.9 g/dL 95.6 38.7  Hematocrit 56.4 - 46.6 % 39.4 42.1  Platelets 150 - 450 x10E3/uL 245 251   Lab Results  Component Value Date   VITAMINB12 568 10/30/2019   Plan:  Check labs today.  - CBC with Differential/Platelet  4. Degenerative disc disease,  lumbar Sharon [name] endorses neuropathy as well.  She takes Eleval and Mobic as needed for back pain.  Plan:  Will follow because mobility and pain control are important for weight management.  5. Uncomplicated asthma, unspecified asthma severity, unspecified whether persistent Sharon [name] is not on any medications for asthma at this time.  Her asthma is stable, without concerns.  Plan:  Check labs today.  6. Vitamin D deficiency Not at goal. Optimal goal > 50 ng/dL. There is also evidence to support a goal of >70 ng/dL in patients with cancer and heart disease.   Plan: Continue Vitamin D @5 ,000 IU daily with follow-up for routine testing of Vitamin D at least 2-3 times per year to avoid over-replacement.  Will check vitamin D level today.  - VITAMIN D 25 Hydroxy (Vit-D Deficiency, Fractures)  7. Insomnia due to medical condition-  hot flashes Sharon [name] has difficulty sleeping.  She says she has occasional hot flashes in the middle of the night.  Plan:  Check labs today.  8. Hyperlipidemia, unspecified hyperlipidemia type Sharon [name] has hyperlipidemia and has been trying to improve her cholesterol levels with intensive lifestyle modification including a low saturated fat diet, exercise and weight loss. She  denies any chest pain, claudication or myalgias.  Lab Results  Component Value Date   ALT 59 (H) 10/30/2019   AST 105 (H) 10/30/2019   ALKPHOS 66 10/30/2019   BILITOT 0.4 10/30/2019   Lab Results  Component Value Date   CHOL 228 (H) 10/30/2019   HDL 67 10/30/2019   LDLCALC 140 (H) 10/30/2019   TRIG 119 10/30/2019   Plan:  Cardiovascular risk and specific lipid/LDL goals reviewed.  We discussed several lifestyle modifications today and Sharon [name] will continue to work on diet, exercise and weight loss efforts. Orders and follow up as documented in patient record.  Check labs today.  Counseling Intensive lifestyle modifications are the first line treatment for this issue. . Dietary changes: Increase  soluble fiber. Decrease simple carbohydrates. . Exercise changes: Moderate to vigorous-intensity aerobic activity 150 minutes per week if tolerated. . Lipid-lowering medications: see documented in medical record.  - Lipid Panel With LDL/HDL Ratio  9. Depression screening Sharon [name] was screened for depression today as part of her new patient workup.  PHQ-9 is 3.  Depression screen is negative.  10. At risk for heart disease Due to Kahlia's current state of health and medical condition(s), she is at a higher risk for heart disease.   This puts the patient at much greater risk to subsequently develop cardiopulmonary conditions that can significantly affect patient's quality of life in a negative manner as well.    At least 9 minutes was spent on counseling Sharon [name] about these concerns today and I stressed the importance of reversing risks factors of obesity, esp truncal and visceral fat, hypertension, hyperlipidemia, pre-diabetes.   Initial goal is to lose at least 5-10% of starting weight to help reduce these risk factors.   Counseling: Intensive lifestyle modifications discussed with Aleyda as most appropriate first line treatment.  she will continue to work on diet, exercise and weight loss efforts.  We will continue to reassess these conditions on a fairly regular basis in an attempt to decrease patient's overall morbidity and mortality.  Evidence-based interventions for health behavior change were utilized today including the discussion of self monitoring techniques, problem-solving barriers and SMART goal setting techniques.  Specifically regarding patient's less desirable eating habits and patterns, we employed the technique of small changes when Karlei has not been able to fully commit to her prudent nutritional plan.  11. Class 1 obesity with serious comorbidity and body mass index (BMI) of 33.0 to 33.9 in adult, unspecified obesity type  Mikeria is currently in the action stage of change and her goal is to  continue with weight loss efforts. I recommend Aliyanna begin the structured treatment plan as follows:  She has agreed to the Category 2 Plan.  Exercise goals: As is.   Behavioral modification strategies: increasing lean protein intake, decreasing simple carbohydrates, increasing water intake, meal planning and cooking strategies and planning for success.  She was informed of the importance of frequent follow-up visits to maximize her success with intensive lifestyle modifications for her multiple health conditions. She was informed we would discuss her lab results at her next visit unless there is a critical issue that needs to be addressed sooner. Chondra agreed to keep her next visit at the agreed upon time to discuss these results.  Objective:   Blood pressure 125/80, pulse 65, temperature 98.7 F (37.1 C), height 5\' 9"  (1.753 m), weight 228 lb (103.4 kg), last menstrual period 03/27/2013, SpO2 98 %. Body mass index is 33.67 kg/m.  Indirect Calorimeter completed  today shows a VO2 of 308 and a REE of 2147.  Her calculated basal metabolic rate is 9826 thus her basal metabolic rate is better than expected.  General: Cooperative, alert, well developed, in no acute distress. HEENT: Conjunctivae and lids unremarkable. Cardiovascular: Regular rhythm.  Lungs: Normal work of breathing. Neurologic: No focal deficits.   Lab Results  Component Value Date   CREATININE 0.85 10/30/2019   BUN 14 10/30/2019   NA 139 10/30/2019   K 4.9 10/30/2019   CL 101 10/30/2019   CO2 27 10/30/2019   Lab Results  Component Value Date   ALT 59 (H) 10/30/2019   AST 105 (H) 10/30/2019   ALKPHOS 66 10/30/2019   BILITOT 0.4 10/30/2019   Lab Results  Component Value Date   HGBA1C 6.1 (H) 10/30/2019   Lab Results  Component Value Date   INSULIN 20.2 10/30/2019   Lab Results  Component Value Date   TSH 1.570 10/30/2019   Lab Results  Component Value Date   CHOL 228 (H) 10/30/2019   HDL 67 10/30/2019    LDLCALC 140 (H) 10/30/2019   TRIG 119 10/30/2019   Lab Results  Component Value Date   WBC 5.0 10/30/2019   HGB 12.9 10/30/2019   HCT 39.4 10/30/2019   MCV 86 10/30/2019   PLT 245 10/30/2019   Attestation Statements:   Reviewed by clinician on day of visit: allergies, medications, problem list, medical history, surgical history, family history, social history, and previous encounter notes.  I, Insurance claims handler, CMA, am acting as Energy manager for [employer], DO.  I have reviewed the above documentation for accuracy and completeness, and I agree with the above. [name], D.O.  The 21st Century Cures Act was signed into law in 2016 which includes the topic of electronic health records.  This provides immediate access to information in MyChart.  This includes consultation notes, operative notes, office notes, lab results and pathology reports.  If you have any questions about what you read please let us know at your next visit so we can discuss your concerns and take corrective action if need be.  We are right here with you.

## 2019-11-13 ENCOUNTER — Encounter (INDEPENDENT_AMBULATORY_CARE_PROVIDER_SITE_OTHER): Payer: Self-pay | Admitting: Family Medicine

## 2019-11-13 ENCOUNTER — Other Ambulatory Visit: Payer: Self-pay

## 2019-11-13 ENCOUNTER — Ambulatory Visit (INDEPENDENT_AMBULATORY_CARE_PROVIDER_SITE_OTHER): Payer: BC Managed Care – PPO | Admitting: Family Medicine

## 2019-11-13 VITALS — BP 106/60 | HR 65 | Temp 98.2°F | Ht 69.0 in | Wt 221.0 lb

## 2019-11-13 DIAGNOSIS — Z6832 Body mass index (BMI) 32.0-32.9, adult: Secondary | ICD-10-CM

## 2019-11-13 DIAGNOSIS — R7303 Prediabetes: Secondary | ICD-10-CM | POA: Diagnosis not present

## 2019-11-13 DIAGNOSIS — E7849 Other hyperlipidemia: Secondary | ICD-10-CM | POA: Diagnosis not present

## 2019-11-13 DIAGNOSIS — E559 Vitamin D deficiency, unspecified: Secondary | ICD-10-CM | POA: Insufficient documentation

## 2019-11-13 DIAGNOSIS — R748 Abnormal levels of other serum enzymes: Secondary | ICD-10-CM | POA: Insufficient documentation

## 2019-11-13 DIAGNOSIS — Z9189 Other specified personal risk factors, not elsewhere classified: Secondary | ICD-10-CM | POA: Diagnosis not present

## 2019-11-13 DIAGNOSIS — E669 Obesity, unspecified: Secondary | ICD-10-CM

## 2019-11-13 MED ORDER — VITAMIN D (ERGOCALCIFEROL) 1.25 MG (50000 UNIT) PO CAPS: 50000.0000 [IU] | ORAL_CAPSULE | ORAL | 0 refills | Status: DC

## 2019-11-13 NOTE — Patient Instructions (Signed)
The 10-year ASCVD risk score Denman George DC Montez Hageman., et al., 2013) is: 1.4%   Values used to calculate the score:     Age: 54 years     Sex: Female     Is Non-Hispanic African American: Yes     Diabetic: No     Tobacco smoker: No     Systolic Blood Pressure: 106 mmHg     Is BP treated: No     HDL Cholesterol: 67 mg/dL     Total Cholesterol: 228 mg/dL

## 2019-11-19 NOTE — Progress Notes (Signed)
Chief Complaint:   OBESITY Sharon [name] is here to discuss her progress with her obesity treatment plan along with follow-up of her obesity related diagnoses. Sharon [name] is on the Category 2 Plan and states she is following her eating plan approximately 75% of the time. Sharon [name] states she is going to the gym for 60 minutes 4 times per week and playing tennis for 90 minutes 5 times per week.  Today's visit was #: 2 Starting weight: 228 lbs Starting date: 10/30/2019 Today's weight: 221 lbs Today's date: 11/13/2019 Total lbs lost to date: 7 lbs Total lbs lost since last in-office visit: 7 lbs Total weight loss percentage to date: -3.07%  Interim History: Sharon [name] states it was very difficult to follow the plan because she was gone for 1 week.  They were in [place] visiting their children.  She ate out the entire time, but did make great food choices and even took her scale with her to the restaurant one time to weigh the steak.  She has been following/eating on plan closely the entire 2 weeks.  Also, worked out a lot prior to leaving for [employer].  Assessment/Plan:   1. Prediabetes New.  Discussed labs with patient today.  Sister has a diagnosis of prediabetes based on her elevated HgA1c and was informed this puts her at greater risk of developing diabetes. She continues to work on diet and exercise to decrease her risk of diabetes. She denies nausea or hypoglycemia.  Elevated A1c and FI.  Plan:  Handouts given.  Recheck labs in 3 months.  Continue prudent nutritional plan and weight loss.  Lab Results  Component Value Date   HGBA1C 6.1 (H) 10/30/2019   Lab Results  Component Value Date   INSULIN 20.2 10/30/2019   2. Elevated liver enzymes New.  Discussed labs with patient today.  She went to [facility] the weekend prior and had a few drinks and Tylenol, and this is not her norm.  She is not sure if these were up in the past with her PCP or not.  Plan:  Recheck labs in 2 months.  Asked her to also follow-up  with her PCP, who has a longer history of lab results with her as well, in case this is a significant change from prior.  Abstain from ETOH.  Will follow closely and recheck.  If it gets 3-4 times normal, we may refer to GI.  Lab Results  Component Value Date   ALT 59 (H) 10/30/2019   AST 105 (H) 10/30/2019   ALKPHOS 66 10/30/2019   BILITOT 0.4 10/30/2019   3. Other hyperlipidemia New.  Discussed labs with patient today.  Sharon [name] has hyperlipidemia and has been trying to improve her cholesterol levels with intensive lifestyle modification including a low saturated fat diet, exercise and weight loss. She denies any chest pain, claudication or myalgias.  Elevated LDL with elevated HDL >60.  Plan:  Recheck after 10% weight loss.  Continue prudent nutritional plan.  The 10-year ASCVD risk score Denman George DC Montez Hageman., et al., 2013) is: 1.4%   Values used to calculate the score:     Age: 54 years     Sex: Female     Is Non-Hispanic African American: Yes     Diabetic: No     Tobacco smoker: No     Systolic Blood Pressure: 106 mmHg     Is BP treated: No     HDL Cholesterol: 67 mg/dL     Total Cholesterol: 228 mg/dL  Lab Results  Component Value Date   ALT 59 (H) 10/30/2019   AST 105 (H) 10/30/2019   ALKPHOS 66 10/30/2019   BILITOT 0.4 10/30/2019   Lab Results  Component Value Date   CHOL 228 (H) 10/30/2019   HDL 67 10/30/2019   LDLCALC 140 (H) 10/30/2019   TRIG 119 10/30/2019   4. Vitamin D deficiency New.  Discussed labs with patient today.  Sharon [name] has a history of Vitamin D deficiency with resultant generalized fatigue as her primary symptom.  she is taking no vitamin D supplement for this deficiency and tolerating it well without side-effect.  She endorses mild fatigue.  Plan:   - Discussed importance of vitamin D (as well as calcium) to their health and wellbeing.   - We reviewed possible symptoms of low Vitamin D including low energy, depressed mood, muscle aches, joint aches,  osteoporosis, etc.  - We discussed that low Vitamin D levels may be linked to an increased risk of cardiovascular events, and even increased risk of cancers, such as colon and breast.   - Educated pt that weight loss will likely improve availability of vitamin D, thus encouraged Sharon [name] to continue with meal plan and their weight loss efforts to further improve this condition.  - I recommend pt start taking weekly prescription vit D 50,000 IU - see script below- which pt agrees to after discussion of the risks and benefits of this medication.  Will recheck level in 3 months.  - Informed patient this may be a lifelong thing, and she was encouraged to continue to take the medicine until told otherwise.  We will need to monitor levels regularly (every 3-4 mo on average) to keep levels within normal limits.   - All pt's questions and concerns regarding this condition addressed.  -Start Vitamin D, Ergocalciferol, (DRISDOL) 1.25 MG (50000 UNIT) CAPS capsule; Take 1 capsule (50,000 Units total) by mouth every 7 (seven) days.  Dispense: 4 capsule; Refill: 0  5. At risk for heart disease Due to Sharon [name]'s current state of health and medical condition(s), she is at a higher risk for heart disease.   This puts the patient at much greater risk to subsequently develop cardiopulmonary conditions that can significantly affect patient's quality of life in a negative manner as well.    At least 30 minutes was spent on counseling Sharon [name] about these concerns today and I stressed the importance of reversing risks factors of obesity, esp truncal and visceral fat, hypertension, hyperlipidemia, pre-diabetes.   Initial goal is to lose at least 5-10% of starting weight to help reduce these risk factors.   Counseling: Intensive lifestyle modifications discussed with Sharon [name] as most appropriate first line treatment.  she will continue to work on diet, exercise and weight loss efforts.  We will continue to reassess these conditions on a  fairly regular basis in an attempt to decrease patient's overall morbidity and mortality.  Evidence-based interventions for health behavior change were utilized today including the discussion of self monitoring techniques, problem-solving barriers and SMART goal setting techniques.  Specifically regarding patient's less desirable eating habits and patterns, we employed the technique of small changes when Jassmin has not been able to fully commit to her prudent nutritional plan.  6. Class 1 obesity with serious comorbidity and body mass index (BMI) of 32.0 to 32.9 in adult, unspecified obesity type  Rajni is currently in the action stage of change. As such, her goal is to continue with weight loss efforts. She has agreed to  the Category 2 Plan with protein equivalents and breakfast options given.   Exercise goals: As is.  Behavioral modification strategies: meal planning and cooking strategies and planning for success.  Talesha has agreed to follow-up with our clinic in 2 weeks. She was informed of the importance of frequent follow-up visits to maximize her success with intensive lifestyle modifications for her multiple health conditions.   Objective:   Blood pressure 106/60, pulse 65, temperature 98.2 F (36.8 C), height 5\' 9"  (1.753 m), weight 221 lb (100.2 kg), last menstrual period 03/27/2013, SpO2 98 %. Body mass index is 32.64 kg/m.  General: Cooperative, alert, well developed, in no acute distress. HEENT: Conjunctivae and lids unremarkable. Cardiovascular: Regular rhythm.  Lungs: Normal work of breathing. Neurologic: No focal deficits.   Lab Results  Component Value Date   CREATININE 0.85 10/30/2019   BUN 14 10/30/2019   NA 139 10/30/2019   K 4.9 10/30/2019   CL 101 10/30/2019   CO2 27 10/30/2019   Lab Results  Component Value Date   ALT 59 (H) 10/30/2019   AST 105 (H) 10/30/2019   ALKPHOS 66 10/30/2019   BILITOT 0.4 10/30/2019   Lab Results  Component Value Date   HGBA1C  6.1 (H) 10/30/2019   Lab Results  Component Value Date   INSULIN 20.2 10/30/2019   Lab Results  Component Value Date   TSH 1.570 10/30/2019   Lab Results  Component Value Date   CHOL 228 (H) 10/30/2019   HDL 67 10/30/2019   LDLCALC 140 (H) 10/30/2019   TRIG 119 10/30/2019   Lab Results  Component Value Date   WBC 5.0 10/30/2019   HGB 12.9 10/30/2019   HCT 39.4 10/30/2019   MCV 86 10/30/2019   PLT 245 10/30/2019   Attestation Statements:   Reviewed by clinician on day of visit: allergies, medications, problem list, medical history, surgical history, family history, social history, and previous encounter notes.  I, 12/30/2019, CMA, am acting as Insurance claims handler for Energy manager, DO.  I have reviewed the above documentation for accuracy and completeness, and I agree with the above. [employer], D.O.  The 21st Century Cures Act was signed into law in 2016 which includes the topic of electronic health records.  This provides immediate access to information in MyChart.  This includes consultation notes, operative notes, office notes, lab results and pathology reports.  If you have any questions about what you read please let 2017 know at your next visit so we can discuss your concerns and take corrective action if need be.  We are right here with you.

## 2019-12-01 ENCOUNTER — Ambulatory Visit (INDEPENDENT_AMBULATORY_CARE_PROVIDER_SITE_OTHER): Payer: BC Managed Care – PPO | Admitting: Family Medicine

## 2019-12-04 ENCOUNTER — Ambulatory Visit (INDEPENDENT_AMBULATORY_CARE_PROVIDER_SITE_OTHER): Payer: BC Managed Care – PPO | Admitting: Family Medicine

## 2019-12-04 ENCOUNTER — Encounter (INDEPENDENT_AMBULATORY_CARE_PROVIDER_SITE_OTHER): Payer: Self-pay | Admitting: Family Medicine

## 2019-12-04 ENCOUNTER — Other Ambulatory Visit: Payer: Self-pay

## 2019-12-04 VITALS — BP 106/65 | HR 54 | Temp 98.1°F | Ht 69.0 in | Wt 215.0 lb

## 2019-12-04 DIAGNOSIS — E669 Obesity, unspecified: Secondary | ICD-10-CM | POA: Diagnosis not present

## 2019-12-04 DIAGNOSIS — E559 Vitamin D deficiency, unspecified: Secondary | ICD-10-CM | POA: Diagnosis not present

## 2019-12-04 DIAGNOSIS — Z6831 Body mass index (BMI) 31.0-31.9, adult: Secondary | ICD-10-CM | POA: Diagnosis not present

## 2019-12-04 DIAGNOSIS — Z9189 Other specified personal risk factors, not elsewhere classified: Secondary | ICD-10-CM | POA: Diagnosis not present

## 2019-12-04 DIAGNOSIS — E66811 Body mass index (BMI) 31.0-31.9, adult: Secondary | ICD-10-CM

## 2019-12-04 MED ORDER — VITAMIN D (ERGOCALCIFEROL) 1.25 MG (50000 UNIT) PO CAPS: 50000.0000 [IU] | ORAL_CAPSULE | ORAL | 0 refills | Status: DC

## 2019-12-05 ENCOUNTER — Other Ambulatory Visit: Payer: Self-pay | Admitting: Family Medicine

## 2019-12-05 DIAGNOSIS — Z1231 Encounter for screening mammogram for malignant neoplasm of breast: Secondary | ICD-10-CM

## 2019-12-08 ENCOUNTER — Other Ambulatory Visit: Payer: Self-pay

## 2019-12-08 ENCOUNTER — Ambulatory Visit
Admission: RE | Admit: 2019-12-08 | Discharge: 2019-12-08 | Disposition: A | Discharge status: Home / Self Care | Payer: BC Managed Care – PPO | Source: Ambulatory Visit | Attending: Family Medicine | Admitting: Family Medicine

## 2019-12-08 DIAGNOSIS — Z1231 Encounter for screening mammogram for malignant neoplasm of breast: Secondary | ICD-10-CM

## 2019-12-10 NOTE — Progress Notes (Signed)
Chief Complaint:   OBESITY Sharon [name] is here to discuss her progress with her obesity treatment plan along with follow-up of her obesity related diagnoses. Sharon [name] is on the Category 2 Plan with protein equivalents and breakfast options given and states she is following her eating plan approximately 75-80% of the time. Sharon [name] states she is playing tennis for 90 minutes and going to the gym for 90 minutes 4-6 times per week.  Today's visit was #: 3 Starting weight: 228 lbs Starting date: 10/30/2019 Today's weight: 215 lbs Today's date: 12/04/2019 Total lbs lost to date: 13 lbs Total lbs lost since last in-office visit: 6 lbs Total weight loss percentage to date: -5.70%  Interim History: Sharon [name] says she has significantly increased her exercise since starting the program.  Increased hunger in the afternoons and between breakfast and lunch.  Assessment/Plan:   1. Vitamin D deficiency [name] Vitamin D level was 40.6 on 10/30/2019. She is currently taking prescription vitamin D 50,000 IU each week. She denies nausea, vomiting or muscle weakness.  Plan:  Refill vitamin D, as per below.  -Refill Vitamin D, Ergocalciferol, (DRISDOL) 1.25 MG (50000 UNIT) CAPS capsule; Take 1 capsule (50,000 Units total) by mouth every 7 (seven) days.  Dispense: 4 capsule; Refill: 0  2. At risk for impaired metabolic function Due to [name] current state of health and medical condition(s), they are at a significantly higher risk for impaired metabolic function.  This further also puts the patient at much greater risk to also subsequently develop cardiopulmonary conditions that can negatively affect patient's quality of life as well.  At least 15 minutes was spent on counseling [name] about these concerns today and I stressed the importance of reversing these risks factors.  Initial goal is to lose at least 5-10% of starting weight to help reduce risk factors.  Counseling: Intensive lifestyle modifications discussed with Ambert  as most appropriate first line treatment.  she will continue to work on diet, exercise and weight loss efforts.  We will continue to reassess these conditions on a fairly regular basis in an attempt to decrease patient's overall morbidity and mortality.  3. Class 1 obesity with serious comorbidity and body mass index (BMI) of 31.0 to 31.9 in adult, unspecified obesity type  Sharon [name] is currently in the action stage of change. As such, her goal is to continue with weight loss efforts. She has agreed to the Category 3 Plan with breakfast options.   Exercise goals: As is.  Behavioral modification strategies: holiday eating strategies , celebration eating strategies and planning for success.  Sharon [name] has agreed to follow-up with our clinic in 2-2.5 weeks. She was informed of the importance of frequent follow-up visits to maximize her success with intensive lifestyle modifications for her multiple health conditions.   Objective:   Blood pressure 106/65, pulse (!) 54, temperature 98.1 F (36.7 C), weight 215 lb (97.5 kg), last menstrual period 03/27/2013, SpO2 98 %. Body mass index is 31.75 kg/m.  General: Cooperative, alert, well developed, in no acute distress. HEENT: Conjunctivae and lids unremarkable. Cardiovascular: Regular rhythm.  Lungs: Normal work of breathing. Neurologic: No focal deficits.   Lab Results  Component Value Date   CREATININE 0.85 10/30/2019   BUN 14 10/30/2019   NA 139 10/30/2019   K 4.9 10/30/2019   CL 101 10/30/2019   CO2 27 10/30/2019   Lab Results  Component Value Date   ALT 59 (H) 10/30/2019   AST 105 (H) 10/30/2019   ALKPHOS 66  10/30/2019   BILITOT 0.4 10/30/2019   Lab Results  Component Value Date   HGBA1C 6.1 (H) 10/30/2019   Lab Results  Component Value Date   INSULIN 20.2 10/30/2019   Lab Results  Component Value Date   TSH 1.570 10/30/2019   Lab Results  Component Value Date   CHOL 228 (H) 10/30/2019   HDL 67 10/30/2019   LDLCALC 140 (H)  10/30/2019   TRIG 119 10/30/2019   Lab Results  Component Value Date   WBC 5.0 10/30/2019   HGB 12.9 10/30/2019   HCT 39.4 10/30/2019   MCV 86 10/30/2019   PLT 245 10/30/2019   Attestation Statements:   Reviewed by clinician on day of visit: allergies, medications, problem list, medical history, surgical history, family history, social history, and previous encounter notes.  I, Insurance claims handler, CMA, am acting as Energy manager for [employer], DO.  I have reviewed the above documentation for accuracy and completeness, and I agree with the above. [name], D.O.  The 21st Century Cures Act was signed into law in 2016 which includes the topic of electronic health records.  This provides immediate access to information in MyChart.  This includes consultation notes, operative notes, office notes, lab results and pathology reports.  If you have any questions about what you read please let us know at your next visit so we can discuss your concerns and take corrective action if need be.  We are right here with you.

## 2019-12-15 ENCOUNTER — Encounter (INDEPENDENT_AMBULATORY_CARE_PROVIDER_SITE_OTHER): Payer: Self-pay | Admitting: Family Medicine

## 2019-12-16 ENCOUNTER — Ambulatory Visit
Admission: RE | Admit: 2019-12-16 | Discharge: 2019-12-16 | Disposition: A | Discharge status: Home / Self Care | Payer: BC Managed Care – PPO | Source: Ambulatory Visit | Attending: Sports Medicine | Admitting: Sports Medicine

## 2019-12-16 ENCOUNTER — Other Ambulatory Visit: Payer: Self-pay

## 2019-12-16 ENCOUNTER — Ambulatory Visit (INDEPENDENT_AMBULATORY_CARE_PROVIDER_SITE_OTHER): Payer: BC Managed Care – PPO | Admitting: Sports Medicine

## 2019-12-16 VITALS — BP 108/78 | Ht 69.0 in | Wt 215.0 lb

## 2019-12-16 DIAGNOSIS — M25551 Pain in right hip: Secondary | ICD-10-CM | POA: Diagnosis not present

## 2019-12-16 DIAGNOSIS — M722 Plantar fascial fibromatosis: Secondary | ICD-10-CM | POA: Diagnosis not present

## 2019-12-16 NOTE — Assessment & Plan Note (Signed)
Patient symptoms are somewhat concerning for osteoarthritis plus or minus labral involvement of the right hip.  She has never had x-rays so we will obtain those today. -AP and lateral x-rays of the hip -Given she also suffers from plantar fasciitis and Achilles tendinitis and is taking meloxicam for that I am hopeful that meloxicam will also help any pain that is coming from any osteoarthritis that may be present -We will call her once x-rays have resulted with any updates.  Follow-up in 4 to 6 weeks.

## 2019-12-16 NOTE — Patient Instructions (Signed)
Patient declined  

## 2019-12-16 NOTE — Assessment & Plan Note (Signed)
She has symptoms that are most consistent with plantar fasciitis of the left foot however it is interesting that this radiates to the back of the calcaneus and she is beginning to have some Achilles "burning".  We will treat for plantar fasciitis and some Achilles tendinitis. -Heel lifts given today along with sports insoles for arch support -Patient already has meloxicam but instructed her to take it once per day with food for the next 2 weeks scheduled then as needed -Patient given exercises and stretches for plantar fasciitis and Achilles tendinitis

## 2019-12-16 NOTE — Progress Notes (Signed)
SUBJECTIVE:   CHIEF COMPLAINT / HPI:   Left foot pain and right hip pain Patient states she is getting left foot pain has had it for almost up to a year.  She plays a lot of tennis and several months back she started noticing pain at the bottom of her foot that radiates to the back of her heel.  She also states that at times she gets a burning sensation of her Achilles tendon.  These symptoms seem to get worse with activity and somewhat better with rest however the pain at the bottom of her foot is quite worse and noticeable in the very beginning of the morning.  The pain is intermittent and does seem to come and go and it does cause her to hobble whenever it begins.  She has also taken ibuprofen sporadically which tends to help.  She states this pain does sometimes keep her up at night and is slowly starting to bother her when she is playing tennis in the past couple weeks which is new.  Additionally she states for the past month and a half she has developed right hip pain.  She describes this pain is located in the right groin and gets worse when she flexes her hip to a great degree or when she bends over to tie her shoe.  She states that also sometimes she feels a catching sensation in the right hip and it just "grabs her".  Activity definitely causes this to get worse certain resting positions tend to make it better.  She has not tried taking any medications for the right hip pain specifically.  PERTINENT  PMH / PSH: History of lumbar spine DDD  OBJECTIVE:   BP 108/78   Ht 5\' 9"  (1.753 m)   Wt 215 lb (97.5 kg)   LMP 03/27/2013   BMI 31.75 kg/m   [facility] 12/16/2019  Frequency of aerobic exercise (# of days/week) 5  Average time in minutes 45  Frequency of strengthening activities (# of days/week) 5   Ankle/Foot, Left: No visible erythema, swelling, ecchymosis, or bony deformity. TTP at the origin of the plantar fasica and at the base of the calcaneous.  Mild TTP over the achilles tendon. No other tenderness. No notable pes planus deformity. Active ROM is full in all directions. Strength is 5/5 in all directions. Able to walk 4 steps.  Hip, Right: No obvious rash, erythema, ecchymosis, or edema. ROM full in all directions but with pain on hip flexion and external rotation; Strength 5/5 in IR/ER/Flex/Ext/Abd/Add. Pelvic alignment unremarkable to inspection and palpation. Standing hip rotation and gait unremarkable. Greater trochanter without tenderness to palpation. No tenderness over piriformis. No SI joint tenderness and normal minimal SI movement. Special Test:    - FABER/FADIR test: Equivical   - Passive Log Roll test: NEG    ASSESSMENT/PLAN:   Plantar fasciitis of left foot She has symptoms that are most consistent with plantar fasciitis of the left foot however it is interesting that this radiates to the back of the calcaneus and she is beginning to have some Achilles "burning".  We will treat for plantar fasciitis and some Achilles tendinitis. -Heel lifts given today along with sports insoles for arch support -Patient already has meloxicam but instructed her to take it once per day with food for the next 2 weeks scheduled then as needed -Patient given exercises and stretches for plantar fasciitis and Achilles tendinitis  Right hip pain Patient symptoms are somewhat  concerning for osteoarthritis plus or minus labral involvement of the right hip.  She has never had x-rays so we will obtain those today. -AP and lateral x-rays of the hip -Given she also suffers from plantar fasciitis and Achilles tendinitis and is taking meloxicam for that I am hopeful that meloxicam will also help any pain that is coming from any osteoarthritis that may be present -We will call her once x-rays have resulted with any updates.  Follow-up in 4 to 6 weeks.     Arlyce Harman, DO PGY-4, Sports Medicine Fellow [facility]  I observed and  examined the patient with the resident and agree with assessment and plan.  Note reviewed and modified by me. I also reviewed Hip XR and no OA at this point. We will start with a series of hip exercises to be done daily. A couple of weeks of meloxicam and then prn usage. Reck in ~ 6 weeks.  Sterling Big, MD

## 2019-12-17 MED ORDER — MELOXICAM 15 MG PO TABS: 15.0000 mg | ORAL_TABLET | Freq: Every day | ORAL | 3 refills | Status: DC | PRN

## 2019-12-24 ENCOUNTER — Ambulatory Visit (INDEPENDENT_AMBULATORY_CARE_PROVIDER_SITE_OTHER): Payer: BC Managed Care – PPO | Admitting: Family Medicine

## 2019-12-24 ENCOUNTER — Other Ambulatory Visit: Payer: Self-pay

## 2019-12-24 ENCOUNTER — Encounter (INDEPENDENT_AMBULATORY_CARE_PROVIDER_SITE_OTHER): Payer: Self-pay | Admitting: Family Medicine

## 2019-12-24 VITALS — BP 126/71 | HR 56 | Temp 98.0°F | Ht 69.0 in | Wt 209.0 lb

## 2019-12-24 DIAGNOSIS — Z6831 Body mass index (BMI) 31.0-31.9, adult: Secondary | ICD-10-CM | POA: Diagnosis not present

## 2019-12-24 DIAGNOSIS — Z9189 Other specified personal risk factors, not elsewhere classified: Secondary | ICD-10-CM | POA: Diagnosis not present

## 2019-12-24 DIAGNOSIS — E669 Obesity, unspecified: Secondary | ICD-10-CM

## 2019-12-24 DIAGNOSIS — E559 Vitamin D deficiency, unspecified: Secondary | ICD-10-CM | POA: Diagnosis not present

## 2019-12-24 DIAGNOSIS — M2550 Pain in unspecified joint: Secondary | ICD-10-CM | POA: Diagnosis not present

## 2019-12-24 NOTE — Progress Notes (Signed)
Chief Complaint:   OBESITY Sharon [name] is here to discuss her progress with her obesity treatment plan along with follow-up of her obesity related diagnoses. Sharon [name] is on the Category 3 Plan with breakfast options and states she is following her eating plan approximately 75% of the time. Sharon [name] states she is going to the gym and playing tennis 90 minutes 4 times per week.  Today's visit was #: 4 Starting weight: 228 lbs Starting date: 10/30/2019 Today's weight: 209 lbs Today's date: 01/03/2020 Total lbs lost to date: 19 lbs Total lbs lost since last in-office visit: 6 lbs Total weight loss percentage to date: -8.33%  Interim History: Sharon [name] notes that she has been consistent with the meal plan. It is going well with no issues. She would like to discuss healthy recipes and states that she misses eating pizza.  Plan: I had a long discussion with Sharon [name] regarding "healthy" ways to create our favorite foods. Recipes provided.   Assessment/Plan:    1. Vitamin D deficiency Harlow's Vitamin D level was 40.6 on 10/30/2019. She is currently taking prescription vitamin D 50,000 IU each week. She denies nausea, vomiting or muscle weakness.  Plan: Continue Vit D as prescribed. Keonda denies need for a refill at this time.  2. Arthralgia, unspecified joint Sharon [name] reports increased pain in her foot lately. She is a patient at ortho and takes Mobic as needed.  Plan: [place] may need adjustments in activity levels and/or to do non-weight bearing exercises, such as the stationary bike, for now.   3. At risk for activity intolerance Sharon [name] was given approximately 9 minutes of exercise intolerance counseling today. She is 54 y.o. female and has risk factors exercise intolerance including obesity. We discussed intensive lifestyle modifications today with an emphasis on specific weight loss instructions and strategies. Sharon [name] will slowly increase activity as tolerated.  4. Class 1 obesity with serious comorbidity and body  mass index (BMI) of 31.0 to 31.9 in adult, unspecified obesity type Sharon [name] is currently in the action stage of change. As such, her goal is to continue with weight loss efforts. She has agreed to the Category 3 Plan with breakfast options.   Exercise goals: As is (as per ortho recommends)  Behavioral modification strategies: increasing lean protein intake, meal planning and cooking strategies and planning for success.  Sharon [name] has agreed to follow-up with our clinic in 2 weeks. She was informed of the importance of frequent follow-up visits to maximize her success with intensive lifestyle modifications for her multiple health conditions.   Objective:   Blood pressure 126/71, pulse (!) 56, temperature 98 F (36.7 C), height 5\' 9"  (1.753 m), weight 209 lb (94.8 kg), last menstrual period 03/27/2013, SpO2 95 %. Body mass index is 30.86 kg/m.  General: Cooperative, alert, well developed, in no acute distress. HEENT: Conjunctivae and lids unremarkable. Cardiovascular: Regular rhythm.  Lungs: Normal work of breathing. Neurologic: No focal deficits.   Lab Results  Component Value Date   CREATININE 0.85 10/30/2019   BUN 14 10/30/2019   NA 139 10/30/2019   K 4.9 10/30/2019   CL 101 10/30/2019   CO2 27 10/30/2019   Lab Results  Component Value Date   ALT 59 (H) 10/30/2019   AST 105 (H) 10/30/2019   ALKPHOS 66 10/30/2019   BILITOT 0.4 10/30/2019   Lab Results  Component Value Date   HGBA1C 6.1 (H) 10/30/2019   Lab Results  Component Value Date   INSULIN 20.2 10/30/2019   Lab Results  Component Value Date   TSH 1.570 10/30/2019   Lab Results  Component Value Date   CHOL 228 (H) 10/30/2019   HDL 67 10/30/2019   LDLCALC 140 (H) 10/30/2019   TRIG 119 10/30/2019   Lab Results  Component Value Date   WBC 5.0 10/30/2019   HGB 12.9 10/30/2019   HCT 39.4 10/30/2019   MCV 86 10/30/2019   PLT 245 10/30/2019    Attestation Statements:   Reviewed by clinician on day of visit:  allergies, medications, problem list, medical history, surgical history, family history, social history, and previous encounter notes.  Edmund Hilda, am acting as Energy manager for [employer], DO.  I have reviewed the above documentation for accuracy and completeness, and I agree with the above. [name], D.O.  The 21st Century Cures Act was signed into law in 2016 which includes the topic of electronic health records.  This provides immediate access to information in MyChart.  This includes consultation notes, operative notes, office notes, lab results and pathology reports.  If you have any questions about what you read please let us know at your next visit so we can discuss your concerns and take corrective action if need be.  We are right here with you.

## 2020-01-07 ENCOUNTER — Encounter (INDEPENDENT_AMBULATORY_CARE_PROVIDER_SITE_OTHER): Payer: Self-pay | Admitting: Family Medicine

## 2020-01-07 ENCOUNTER — Other Ambulatory Visit: Payer: Self-pay

## 2020-01-07 ENCOUNTER — Ambulatory Visit (INDEPENDENT_AMBULATORY_CARE_PROVIDER_SITE_OTHER): Payer: BC Managed Care – PPO | Admitting: Family Medicine

## 2020-01-07 VITALS — BP 112/58 | HR 58 | Temp 98.4°F | Ht 69.0 in | Wt 208.0 lb

## 2020-01-07 DIAGNOSIS — Z6830 Body mass index (BMI) 30.0-30.9, adult: Secondary | ICD-10-CM | POA: Diagnosis not present

## 2020-01-07 DIAGNOSIS — Z9189 Other specified personal risk factors, not elsewhere classified: Secondary | ICD-10-CM

## 2020-01-07 DIAGNOSIS — E559 Vitamin D deficiency, unspecified: Secondary | ICD-10-CM | POA: Diagnosis not present

## 2020-01-07 DIAGNOSIS — E669 Obesity, unspecified: Secondary | ICD-10-CM

## 2020-01-07 DIAGNOSIS — K219 Gastro-esophageal reflux disease without esophagitis: Secondary | ICD-10-CM

## 2020-01-07 MED ORDER — PANTOPRAZOLE SODIUM 20 MG PO TBEC: 20.0000 mg | DELAYED_RELEASE_TABLET | Freq: Every day | ORAL | 0 refills | Status: DC

## 2020-01-07 MED ORDER — VITAMIN D (ERGOCALCIFEROL) 1.25 MG (50000 UNIT) PO CAPS: 50000.0000 [IU] | ORAL_CAPSULE | ORAL | 0 refills | Status: DC

## 2020-01-07 NOTE — Progress Notes (Signed)
Chief Complaint:   OBESITY Sharon [name] is here to discuss her progress with her obesity treatment plan along with follow-up of her obesity related diagnoses. Sharon [name] is on the Category 3 Plan with breakfast options and states she is following her eating plan approximately 30% of the time. Sharon [name] states she is doing gym exercise 90 minutes 4 times per week.  Today's visit was #: 5 Starting weight: 228 lbs Starting date: 10/30/2019 Today's weight: 208 lbs Today's date: 01/10/2020 Total lbs lost to date: 20 lbs Total lbs lost since last in-office visit: 1 lb Total weight loss percentage to date: -8.77%  Interim History: Sharon [name] reports that she has been skipping meals lately. She is still working out and playing tennis but just not getting her foods in. Sharon [name] denies hunger or cravings or issues with plan.  Plan: Sharon [name]'s goal is to maintain her weight over the holidays.  Assessment/Plan:   Meds ordered this encounter  Medications  . Vitamin D, Ergocalciferol, (DRISDOL) 1.25 MG (50000 UNIT) CAPS capsule    Sig: Take 1 capsule (50,000 Units total) by mouth every 7 (seven) days.    Dispense:  4 capsule    Refill:  0  . pantoprazole (PROTONIX) 20 MG tablet    Sig: Take 1 tablet (20 mg total) by mouth daily.    Dispense:  30 tablet    Refill:  0   No orders of the defined types were placed in this encounter.    1. Gastroesophageal reflux disease, unspecified whether esophagitis present Sharon [name] is in need of a refill of Protonix. She has been out of it for several weeks now. Her symptoms have been worse lately, especially after she recently  had spaghetti sauce.  Plan: Start Protonix 20 mg 1 tablet by mouth daily. Discussed with patient foods to avoid, and she should not eat within 3-4 hours of lying down.  Start- pantoprazole (PROTONIX) 20 MG tablet; Take 1 tablet (20 mg total) by mouth daily.  Dispense: 30 tablet; Refill: 0   2. Vitamin D deficiency Sharon [name]'s Vitamin D level was 40.6 on  10/30/2019. She is currently taking prescription vitamin D 50,000 IU each week. She denies nausea, vomiting or muscle weakness.   Ref. Range 10/30/2019 10:09  Vitamin D, 25-Hydroxy Latest Ref Range: 30.0 - 100.0 ng/mL 40.6   Plan: Refill Vit D for 1 month, as per below. Low Vitamin D level contributes to fatigue and are associated with obesity, breast, and colon cancer. She agrees to continue to take prescription Vitamin D @50 ,000 IU every week and will follow-up for routine testing of Vitamin D, at least 2-3 times per year to avoid over-replacement.  Refill- Vitamin D, Ergocalciferol, (DRISDOL) 1.25 MG (50000 UNIT) CAPS capsule; Take 1 capsule (50,000 Units total) by mouth every 7 (seven) days.  Dispense: 4 capsule; Refill: 0   3. At risk for deficient intake of food Sharon [name] was given approximately 15 minutes of deficit intake of food prevention counseling today. Sharon [name] is at risk for eating too few calories based on current food recall. She was encouraged to focus on meeting caloric and protein goals according to her recommended meal plan.    4. Class 1 obesity with serious comorbidity and body mass index (BMI) of 30.0 to 30.9 in adult, unspecified obesity type Sharon [name] is currently in the action stage of change. As such, her goal is to continue with weight loss efforts. She has agreed to the Category 3 Plan with breakfast options.   Exercise goals:  As is  Behavioral modification strategies: no skipping meals, holiday eating strategies , celebration eating strategies and planning for success.  Sharon [name] has agreed to follow-up with our clinic in 3 weeks. She was informed of the importance of frequent follow-up visits to maximize her success with intensive lifestyle modifications for her multiple health conditions.     Objective:   Blood pressure (!) 112/58, pulse (!) 58, temperature 98.4 F (36.9 C), height 5\' 9"  (1.753 m), weight 208 lb (94.3 kg), last menstrual period 03/27/2013, SpO2 99 %. Body  mass index is 30.72 kg/m.  General: Cooperative, alert, well developed, in no acute distress. HEENT: Conjunctivae and lids unremarkable. Cardiovascular: Regular rhythm.  Lungs: Normal work of breathing. Neurologic: No focal deficits.   Lab Results  Component Value Date   CREATININE 0.85 10/30/2019   BUN 14 10/30/2019   NA 139 10/30/2019   K 4.9 10/30/2019   CL 101 10/30/2019   CO2 27 10/30/2019   Lab Results  Component Value Date   ALT 59 (H) 10/30/2019   AST 105 (H) 10/30/2019   ALKPHOS 66 10/30/2019   BILITOT 0.4 10/30/2019   Lab Results  Component Value Date   HGBA1C 6.1 (H) 10/30/2019   Lab Results  Component Value Date   INSULIN 20.2 10/30/2019   Lab Results  Component Value Date   TSH 1.570 10/30/2019   Lab Results  Component Value Date   CHOL 228 (H) 10/30/2019   HDL 67 10/30/2019   LDLCALC 140 (H) 10/30/2019   TRIG 119 10/30/2019   Lab Results  Component Value Date   WBC 5.0 10/30/2019   HGB 12.9 10/30/2019   HCT 39.4 10/30/2019   MCV 86 10/30/2019   PLT 245 10/30/2019    Attestation Statements:   Reviewed by clinician on day of visit: allergies, medications, problem list, medical history, surgical history, family history, social history, and previous encounter notes.  12/30/2019, am acting as Edmund Hilda for Energy manager, DO.  I have reviewed the above documentation for accuracy and completeness, and I agree with the above. [employer], D.O.  The 21st Century Cures Act was signed into law in 2016 which includes the topic of electronic health records.  This provides immediate access to information in MyChart.  This includes consultation notes, operative notes, office notes, lab results and pathology reports.  If you have any questions about what you read please let 2017 know at your next visit so we can discuss your concerns and take corrective action if need be.  We are right here with you.

## 2020-01-29 ENCOUNTER — Ambulatory Visit (INDEPENDENT_AMBULATORY_CARE_PROVIDER_SITE_OTHER): Payer: BC Managed Care – PPO | Admitting: Family Medicine

## 2020-01-29 ENCOUNTER — Encounter (INDEPENDENT_AMBULATORY_CARE_PROVIDER_SITE_OTHER): Payer: Self-pay | Admitting: Family Medicine

## 2020-01-29 ENCOUNTER — Other Ambulatory Visit: Payer: Self-pay

## 2020-01-29 VITALS — BP 101/62 | HR 63 | Temp 98.0°F | Ht 69.0 in | Wt 203.0 lb

## 2020-01-29 DIAGNOSIS — E559 Vitamin D deficiency, unspecified: Secondary | ICD-10-CM

## 2020-01-29 DIAGNOSIS — Z9189 Other specified personal risk factors, not elsewhere classified: Secondary | ICD-10-CM

## 2020-01-29 DIAGNOSIS — Z6830 Body mass index (BMI) 30.0-30.9, adult: Secondary | ICD-10-CM

## 2020-01-29 DIAGNOSIS — E669 Obesity, unspecified: Secondary | ICD-10-CM | POA: Diagnosis not present

## 2020-01-29 MED ORDER — VITAMIN D (ERGOCALCIFEROL) 1.25 MG (50000 UNIT) PO CAPS: 50000.0000 [IU] | ORAL_CAPSULE | ORAL | 0 refills | Status: DC

## 2020-02-03 NOTE — Progress Notes (Signed)
Chief Complaint:   OBESITY Nicosha is here to discuss her progress with her obesity treatment plan along with follow-up of her obesity related diagnoses. Lenah is on the Category 3 Plan + breakfast options and states she is following her eating plan approximately 75% of the time. Danashia states she is playing tennis and going to the gym 90 minutes 4 times per week.  Today's visit was #: 6 Starting weight: 228 lbs Starting date: 10/30/2019 Today's weight: 203 lbs Today's date: 01/29/2020 Total lbs lost to date: 25 lbs Total lbs lost since last in-office visit: 5 lbs Total weight loss percentage to date: -10.96%  Interim History:   Doing great! Kylia has increased her activity levels to at least 45 minutes of cardio and 45 minutes of weight lifting, plus tennis, 4-5 days a week. She did some off the plan eating over holidays. but otherwise focused on increasing protein and decreasing carbohydrates if she ate off plan at all, as well as moving more.   Assessment/Plan:  No orders of the defined types were placed in this encounter.  Meds ordered this encounter  Medications  . Vitamin D, Ergocalciferol, (DRISDOL) 1.25 MG (50000 UNIT) CAPS capsule    Sig: Take 1 capsule (50,000 Units total) by mouth every 7 (seven) days.    Dispense:  4 capsule    Refill:  0    1. Vitamin D deficiency Rodnesha's Vitamin D level was 40.6 on 10/7/20221. She is currently taking prescription vitamin D 50,000 IU each week. She denies nausea, vomiting or muscle weakness.  Ref. Range 10/30/2019 10:09  Vitamin D, 25-Hydroxy Latest Ref Range: 30.0 - 100.0 ng/mL 40.6   Plan: Refill Vit D for 1 month, as per below. - Reiterated importance of vitamin D (as well as calcium) to their health and well being.  - weight loss will likely improve availability of vitamin D, thus encouraged her to continue with meal plan and their weight loss efforts to further improve this condition. - I recommend patient continue to take weekly  prescription vit D 50,000 IU - this may be a lifelong thing, and she was encouraged to continue to take the medicine until told otherwise.   - we will need to monitor levels regularly (every 3-4 mo on average) to keep levels within normal limits.  - weight loss will likely improve availability of vitamin D, thus encouraged Oveta to continue with meal plan and their weight loss efforts to further improve this condition - pt's questions and concerns regarding this condition addressed.  Refill- Vitamin D, Ergocalciferol, (DRISDOL) 1.25 MG (50000 UNIT) CAPS capsule; Take 1 capsule (50,000 Units total) by mouth every 7 (seven) days.  Dispense: 4 capsule; Refill: 0  2. At risk for malnutrition Hadleigh was given approximately 15 minutes of counseling today regarding prevention of malnutrition and ways to meet macronutrient goals..   3. Class 1 obesity with serious comorbidity and body mass index (BMI) of 30.0 to 30.9 in adult, unspecified obesity type Kasidy is currently in the action stage of change. As such, her goal is to continue with weight loss efforts. She has agreed to the Category 3 Plan with breakfast options.   Exercise goals: As is  Behavioral modification strategies: increasing lean protein intake, keeping healthy foods in the home and planning for success.  Bridney has agreed to follow-up with our clinic in 2 weeks. She was informed of the importance of frequent follow-up visits to maximize her success with intensive lifestyle modifications for  her multiple health conditions.   Objective:   Blood pressure 101/62, pulse 63, temperature 98 F (36.7 C), height 5\' 9"  (1.753 m), weight 203 lb (92.1 kg), last menstrual period 03/27/2013, SpO2 94 %. Body mass index is 29.98 kg/m.  General: Cooperative, alert, well developed, in no acute distress. HEENT: Conjunctivae and lids unremarkable. Cardiovascular: Regular rhythm.  Lungs: Normal work of breathing. Neurologic: No focal deficits.   Lab  Results  Component Value Date   CREATININE 0.85 10/30/2019   BUN 14 10/30/2019   NA 139 10/30/2019   K 4.9 10/30/2019   CL 101 10/30/2019   CO2 27 10/30/2019   Lab Results  Component Value Date   ALT 59 (H) 10/30/2019   AST 105 (H) 10/30/2019   ALKPHOS 66 10/30/2019   BILITOT 0.4 10/30/2019   Lab Results  Component Value Date   HGBA1C 6.1 (H) 10/30/2019   Lab Results  Component Value Date   INSULIN 20.2 10/30/2019   Lab Results  Component Value Date   TSH 1.570 10/30/2019   Lab Results  Component Value Date   CHOL 228 (H) 10/30/2019   HDL 67 10/30/2019   LDLCALC 140 (H) 10/30/2019   TRIG 119 10/30/2019   Lab Results  Component Value Date   WBC 5.0 10/30/2019   HGB 12.9 10/30/2019   HCT 39.4 10/30/2019   MCV 86 10/30/2019   PLT 245 10/30/2019    Attestation Statements:   Reviewed by clinician on day of visit: allergies, medications, problem list, medical history, surgical history, family history, social history, and previous encounter notes.  12/30/2019, am acting as Edmund Hilda for Energy manager, DO.  I have reviewed the above documentation for accuracy and completeness, and I agree with the above. [employer], D.O.  The 21st Century Cures Act was signed into law in 2016 which includes the topic of electronic health records.  This provides immediate access to information in MyChart.  This includes consultation notes, operative notes, office notes, lab results and pathology reports.  If you have any questions about what you read please let 2017 know at your next visit so we can discuss your concerns and take corrective action if need be.  We are right here with you.

## 2020-02-11 ENCOUNTER — Ambulatory Visit (INDEPENDENT_AMBULATORY_CARE_PROVIDER_SITE_OTHER): Payer: BC Managed Care – PPO | Admitting: Family Medicine

## 2020-02-16 ENCOUNTER — Encounter (INDEPENDENT_AMBULATORY_CARE_PROVIDER_SITE_OTHER): Payer: Self-pay | Admitting: Family Medicine

## 2020-02-16 ENCOUNTER — Ambulatory Visit (INDEPENDENT_AMBULATORY_CARE_PROVIDER_SITE_OTHER): Payer: BC Managed Care – PPO | Admitting: Family Medicine

## 2020-02-16 ENCOUNTER — Other Ambulatory Visit: Payer: Self-pay

## 2020-02-16 VITALS — BP 100/66 | HR 59 | Temp 98.5°F | Ht 69.0 in | Wt 201.0 lb

## 2020-02-16 DIAGNOSIS — E559 Vitamin D deficiency, unspecified: Secondary | ICD-10-CM | POA: Diagnosis not present

## 2020-02-16 DIAGNOSIS — E669 Obesity, unspecified: Secondary | ICD-10-CM

## 2020-02-16 DIAGNOSIS — R748 Abnormal levels of other serum enzymes: Secondary | ICD-10-CM | POA: Diagnosis not present

## 2020-02-16 DIAGNOSIS — R7303 Prediabetes: Secondary | ICD-10-CM

## 2020-02-16 DIAGNOSIS — E7849 Other hyperlipidemia: Secondary | ICD-10-CM

## 2020-02-16 DIAGNOSIS — Z9189 Other specified personal risk factors, not elsewhere classified: Secondary | ICD-10-CM

## 2020-02-16 DIAGNOSIS — Z6830 Body mass index (BMI) 30.0-30.9, adult: Secondary | ICD-10-CM

## 2020-02-16 MED ORDER — VITAMIN D (ERGOCALCIFEROL) 1.25 MG (50000 UNIT) PO CAPS: 50000.0000 [IU] | ORAL_CAPSULE | ORAL | 0 refills | Status: DC

## 2020-02-17 LAB — COMPREHENSIVE METABOLIC PANEL
ALT: 20 IU/L (ref 0–32)
AST: 24 IU/L (ref 0–40)
Albumin/Globulin Ratio: 1.6 (ref 1.2–2.2)
Albumin: 4.6 g/dL (ref 3.8–4.9)
Alkaline Phosphatase: 73 IU/L (ref 44–121)
BUN/Creatinine Ratio: 26 — ABNORMAL HIGH (ref 9–23)
BUN: 23 mg/dL (ref 6–24)
Bilirubin Total: 0.3 mg/dL (ref 0.0–1.2)
CO2: 27 mmol/L (ref 20–29)
Calcium: 10.1 mg/dL (ref 8.7–10.2)
Chloride: 101 mmol/L (ref 96–106)
Creatinine, Ser: 0.88 mg/dL (ref 0.57–1.00)
GFR calc Af Amer: 86 mL/min/{1.73_m2} (ref >59)
GFR calc non Af Amer: 75 mL/min/{1.73_m2} (ref >59)
Globulin, Total: 2.9 g/dL (ref 1.5–4.5)
Glucose: 93 mg/dL (ref 65–99)
Potassium: 5.3 mmol/L — ABNORMAL HIGH (ref 3.5–5.2)
Sodium: 141 mmol/L (ref 134–144)
Total Protein: 7.5 g/dL (ref 6.0–8.5)

## 2020-02-17 LAB — LIPID PANEL
Chol/HDL Ratio: 3.2 ratio (ref 0.0–4.4)
Cholesterol, Total: 215 mg/dL — ABNORMAL HIGH (ref 100–199)
HDL: 67 mg/dL (ref >39)
LDL Chol Calc (NIH): 134 mg/dL — ABNORMAL HIGH (ref 0–99)
Triglycerides: 79 mg/dL (ref 0–149)
VLDL Cholesterol Cal: 14 mg/dL (ref 5–40)

## 2020-02-17 LAB — HEMOGLOBIN A1C
Est. average glucose Bld gHb Est-mCnc: 123 mg/dL
Hgb A1c MFr Bld: 5.9 % — ABNORMAL HIGH (ref 4.8–5.6)

## 2020-02-17 LAB — VITAMIN D 25 HYDROXY (VIT D DEFICIENCY, FRACTURES): Vit D, 25-Hydroxy: 46.1 ng/mL (ref 30.0–100.0)

## 2020-02-17 LAB — INSULIN, RANDOM: INSULIN: 10.9 u[IU]/mL (ref 2.6–24.9)

## 2020-02-17 NOTE — Progress Notes (Signed)
Chief Complaint:   OBESITY Sharon [name] is here to discuss her progress with her obesity treatment plan along with follow-up of her obesity related diagnoses. Sharon [name] is on the Category 3 Plan with breakfast options and states she is following her eating plan approximately 50% of the time. Sharon [name] states she is going to the gym and playing tennis 90 minutes 5-6 times per week.  Today's visit was #: 7 Starting weight: 228 lbs Starting date: 10/30/2019 Today's weight: 201 lbs Today's date: 02/16/2020 Total lbs lost to date: 27 lbs Total lbs lost since last in-office visit: 2 lbs Total weight loss percentage to date: -11.84%  Interim History: Pt is still going to the gym, even more lately, but with bad weather she a little off plan more so than prior OV. She denies concerns with meal plan. She is feeling stronger and moving much better on the tennis court than prior.   Assessment/Plan:   Meds ordered this encounter  Medications  . Vitamin D, Ergocalciferol, (DRISDOL) 1.25 MG (50000 UNIT) CAPS capsule    Sig: Take 1 capsule (50,000 Units total) by mouth every 7 (seven) days.    Dispense:  4 capsule    Refill:  0    Lab Orders     Comprehensive metabolic panel     Hemoglobin E9F     Insulin, random     Lipid panel     VITAMIN D 25 Hydroxy (Vit-D Deficiency, Fractures)    1. Pre-diabetes Pt is not currently on meds for pre-diabetes.  Sharon [name] has a diagnosis of prediabetes based on her elevated HgA1c and was informed this puts her at greater risk of developing diabetes. She continues to work on diet and exercise to decrease her risk of diabetes. She denies nausea or hypoglycemia.  Lab Results  Component Value Date   HGBA1C 5.9 (H) 02/16/2020   Lab Results  Component Value Date   INSULIN 10.9 02/16/2020   INSULIN 20.2 10/30/2019   Plan: Check labs today. Sharon [name] will continue to work on weight loss, exercise, and decreasing simple carbohydrates to help decrease the risk of diabetes.     Lab/Orders today or future: - Hemoglobin A1c - Insulin, random  2. Elevated liver enzymes Sharon [name] has a dx of elevated ALT. Her BMI is over 40. She denies abdominal pain or jaundice and has never been told of any liver problems in the past. She denies excessive alcohol intake.  Ref. Range 10/30/2019 10:09  Alkaline Phosphatase Latest Ref Range: 44 - 121 IU/L 66  Albumin Latest Ref Range: 3.8 - 4.9 g/dL 4.5  Albumin/Globulin Ratio Latest Ref Range: 1.2 - 2.2  1.6  AST Latest Ref Range: 0 - 40 IU/L 105 (H)  ALT Latest Ref Range: 0 - 32 IU/L 59 (H)  Total Protein Latest Ref Range: 6.0 - 8.5 g/dL 7.4  Total Bilirubin Latest Ref Range: 0.0 - 1.2 mg/dL 0.4    Ref. Range 10/30/2019 10:09  Sodium Latest Ref Range: 134 - 144 mmol/L 139  Potassium Latest Ref Range: 3.5 - 5.2 mmol/L 4.9  Chloride Latest Ref Range: 96 - 106 mmol/L 101  CO2 Latest Ref Range: 20 - 29 mmol/L 27  Glucose Latest Ref Range: 65 - 99 mg/dL 99  BUN Latest Ref Range: 6 - 24 mg/dL 14  Creatinine Latest Ref Range: 0.57 - 1.00 mg/dL 8.10  Calcium Latest Ref Range: 8.7 - 10.2 mg/dL 9.9  BUN/Creatinine Ratio Latest Ref Range: 9 - 23  16  Plan: Check CMP today. Continue avoiding hepatotoxic substances and increase exercise as she has been doing. Continue weight loss. We discussed the likely diagnosis of non-alcoholic fatty liver disease today and how this condition is obesity related. Sharon [name] was educated the importance of weight loss. Sharon [name] agreed to continue with her weight loss efforts with healthier diet and exercise as an essential part of her treatment plan.  Lab/Orders today or future: - Comprehensive metabolic panel - Lipid panel  3. Other hyperlipidemia Sharon [name] has hyperlipidemia and has been trying to improve her cholesterol levels with intensive lifestyle modification including a low saturated fat diet, exercise and weight loss. She denies any chest pain, claudication or myalgias. She is not on meds.  Lab Results   Component Value Date   ALT 20 02/16/2020   AST 24 02/16/2020   ALKPHOS 73 02/16/2020   BILITOT 0.3 02/16/2020   Lab Results  Component Value Date   CHOL 215 (H) 02/16/2020   HDL 67 02/16/2020   LDLCALC 134 (H) 02/16/2020   TRIG 79 02/16/2020   CHOLHDL 3.2 02/16/2020   Plan: Check labs today. Cardiovascular risk and specific lipid/LDL goals reviewed.  We discussed several lifestyle modifications today and Sharon [name] will continue to work on diet, exercise and weight loss efforts. Orders and follow up as documented in patient record.   Counseling Intensive lifestyle modifications are the first line treatment for this issue. . Dietary changes: Increase soluble fiber. Decrease simple carbohydrates. . Exercise changes: Moderate to vigorous-intensity aerobic activity 150 minutes per week if tolerated. . Lipid-lowering medications: see documented in medical record.  Lab/Orders today or future: - Comprehensive metabolic panel - Lipid panel  4. Vitamin D deficiency Sharon [name]'s Vitamin D level was 40.6 on 10/30/2019. She is currently taking prescription vitamin D 50,000 IU each week. She denies nausea, vomiting or muscle weakness.   Ref. Range 10/30/2019 10:09  Vitamin D, 25-Hydroxy Latest Ref Range: 30.0 - 100.0 ng/mL 40.6   Plan: Refill Vit D for 1 month, as per below. Check labs today. Low Vitamin D level contributes to fatigue and are associated with obesity, breast, and colon cancer. She agrees to continue to take prescription Vitamin D @50 ,000 IU every week and will follow-up for routine testing of Vitamin D, at least 2-3 times per year to avoid over-replacement.  Refill- Vitamin D, Ergocalciferol, (DRISDOL) 1.25 MG (50000 UNIT) CAPS capsule; Take 1 capsule (50,000 Units total) by mouth every 7 (seven) days.  Dispense: 4 capsule; Refill: 0  Lab/Orders today or future: - VITAMIN D 25 Hydroxy (Vit-D Deficiency, Fractures)  5. At risk for heart disease Due to Sharon [name]'s current state of health and  medical condition(s), she is at a higher risk for heart disease. This puts the patient at much greater risk to subsequently develop cardiopulmonary conditions that can significantly affect patient's quality of life in a negative manner as well.    At least 15 minutes was spent on counseling Sharon [name] about these concerns today and I stressed the importance of reversing risks factors of obesity, esp truncal and visceral fat, hypertension, hyperlipidemia, pre-diabetes. Initial goal is to lose at least 5-10% of starting weight to help reduce these risk factors. Counseling: Intensive lifestyle modifications discussed with Sharon [name] as most appropriate first line treatment.  she will continue to work on diet, exercise and weight loss efforts. We will continue to reassess these conditions on a fairly regular basis in an attempt to decrease patient's overall morbidity and mortality.  Evidence-based interventions for health behavior change were  utilized today including the discussion of self monitoring techniques, problem-solving barriers and SMART goal setting techniques. Specifically regarding patient's less desirable eating habits and patterns, we employed the technique of small changes when Sharon [name] has not been able to fully commit to her prudent nutritional plan.   6. Class 1 obesity with serious comorbidity and body mass index (BMI) of 30.0 to 30.9 in adult, unspecified obesity type Sharon [name] is currently in the action stage of change. As such, her goal is to continue with weight loss efforts. She has agreed to the Category 3 Plan with breakfast options.   Exercise goals: As is  Behavioral modification strategies: increasing water intake and planning for success.  Sharon [name] has agreed to follow-up with our clinic in 3 weeks. She was informed of the importance of frequent follow-up visits to maximize her success with intensive lifestyle modifications for her multiple health conditions.   Sharon [name] was informed we would discuss her  lab results at her next visit unless there is a critical issue that needs to be addressed sooner. Sharon [name] agreed to keep her next visit at the agreed upon time to discuss these results.  Objective:   Blood pressure 100/66, pulse (!) 59, temperature 98.5 F (36.9 C), height 5\' 9"  (1.753 m), weight 201 lb (91.2 kg), last menstrual period 03/27/2013, SpO2 97 %. Body mass index is 29.68 kg/m.  General: Cooperative, alert, well developed, in no acute distress. HEENT: Conjunctivae and lids unremarkable. Cardiovascular: Regular rhythm.  Lungs: Normal work of breathing. Neurologic: No focal deficits.   Lab Results  Component Value Date   CREATININE 0.88 02/16/2020   BUN 23 02/16/2020   NA 141 02/16/2020   K 5.3 (H) 02/16/2020   CL 101 02/16/2020   CO2 27 02/16/2020   Lab Results  Component Value Date   ALT 20 02/16/2020   AST 24 02/16/2020   ALKPHOS 73 02/16/2020   BILITOT 0.3 02/16/2020   Lab Results  Component Value Date   HGBA1C 5.9 (H) 02/16/2020   HGBA1C 6.1 (H) 10/30/2019   Lab Results  Component Value Date   INSULIN 10.9 02/16/2020   INSULIN 20.2 10/30/2019   Lab Results  Component Value Date   TSH 1.570 10/30/2019   Lab Results  Component Value Date   CHOL 215 (H) 02/16/2020   HDL 67 02/16/2020   LDLCALC 134 (H) 02/16/2020   TRIG 79 02/16/2020   CHOLHDL 3.2 02/16/2020   Lab Results  Component Value Date   WBC 5.0 10/30/2019   HGB 12.9 10/30/2019   HCT 39.4 10/30/2019   MCV 86 10/30/2019   PLT 245 10/30/2019    Attestation Statements:   Reviewed by clinician on day of visit: allergies, medications, problem list, medical history, surgical history, family history, social history, and previous encounter notes.  12/30/2019, am acting as Edmund Hilda for Energy manager, DO.  I have reviewed the above documentation for accuracy and completeness, and I agree with the above. [employer], D.O.  The 21st Century Cures Act was signed into  law in 2016 which includes the topic of electronic health records.  This provides immediate access to information in MyChart.  This includes consultation notes, operative notes, office notes, lab results and pathology reports.  If you have any questions about what you read please let 2017 know at your next visit so we can discuss your concerns and take corrective action if need be.  We are right here with you.

## 2020-03-11 ENCOUNTER — Ambulatory Visit: Payer: BC Managed Care – PPO | Admitting: Sports Medicine

## 2020-03-18 ENCOUNTER — Other Ambulatory Visit: Payer: Self-pay

## 2020-03-18 ENCOUNTER — Encounter (INDEPENDENT_AMBULATORY_CARE_PROVIDER_SITE_OTHER): Payer: Self-pay | Admitting: Family Medicine

## 2020-03-18 ENCOUNTER — Ambulatory Visit (INDEPENDENT_AMBULATORY_CARE_PROVIDER_SITE_OTHER): Payer: BC Managed Care – PPO | Admitting: Family Medicine

## 2020-03-18 VITALS — BP 111/72 | HR 77 | Temp 97.9°F | Ht 69.0 in | Wt 196.0 lb

## 2020-03-18 DIAGNOSIS — E7849 Other hyperlipidemia: Secondary | ICD-10-CM

## 2020-03-18 DIAGNOSIS — Z9189 Other specified personal risk factors, not elsewhere classified: Secondary | ICD-10-CM | POA: Diagnosis not present

## 2020-03-18 DIAGNOSIS — E559 Vitamin D deficiency, unspecified: Secondary | ICD-10-CM

## 2020-03-18 DIAGNOSIS — E669 Obesity, unspecified: Secondary | ICD-10-CM

## 2020-03-18 DIAGNOSIS — Z6830 Body mass index (BMI) 30.0-30.9, adult: Secondary | ICD-10-CM

## 2020-03-18 MED ORDER — VITAMIN D (ERGOCALCIFEROL) 1.25 MG (50000 UNIT) PO CAPS: 50000.0000 [IU] | ORAL_CAPSULE | ORAL | 0 refills | Status: DC

## 2020-03-22 NOTE — Progress Notes (Signed)
Chief Complaint:   OBESITY Sharon [name] is here to discuss her progress with her obesity treatment plan along with follow-up of her obesity related diagnoses.   Today's visit was #: 8 Starting weight: 228 lbs Starting date: 10/30/2019 Today's weight: 196 lbs Today's date: 03/18/2020 Total lbs lost to date: 32 lbs Body mass index is 28.94 kg/m.  Total weight loss percentage to date: -14.04%  Interim History:  Sharon [name] has increased her exercise lately with more tennis sessions per week and added pilates, and is still going to the gym.  She says she has not done so great with staying on plan.  Current Meal Plan: the Category 3 Plan with breakfast options for 40% of the time.  Current Exercise Plan: Tennis, weights, pilates for 90 minutes 7 times per week. Current Anti-Obesity Medications: None.   Assessment/Plan:   No orders of the defined types were placed in this encounter.  Medications Discontinued During This Encounter  Medication Reason  . Vitamin D, Ergocalciferol, (DRISDOL) 1.25 MG (50000 UNIT) CAPS capsule Reorder     Meds ordered this encounter  Medications  . Vitamin D, Ergocalciferol, (DRISDOL) 1.25 MG (50000 UNIT) CAPS capsule    Sig: Take 1 capsule (50,000 Units total) by mouth every 7 (seven) days.    Dispense:  4 capsule    Refill:  0     1. Other hyperlipidemia Course: at goal for age but we'd love to decrease her LDL.  Lipid-lowering medications: None.   LDL is elevated but HDL is >60.  Plan: Dietary changes: Increase soluble fiber, decrease simple carbohydrates, decrease saturated fat. Exercise changes: Moderate to vigorous-intensity aerobic activity 150 minutes per week or as tolerated. We will continue to monitor along with PCP/specialists as it pertains to her weight loss journey.  Continue prudent nutritional plan and exercise.  We will continue to focus on decreasing sat/trans fats.  Red meat <2 times per week.  Lab Results  Component Value Date   CHOL 215  (H) 02/16/2020   HDL 67 02/16/2020   LDLCALC 134 (H) 02/16/2020   TRIG 79 02/16/2020   CHOLHDL 3.2 02/16/2020   Lab Results  Component Value Date   ALT 20 02/16/2020   AST 24 02/16/2020   ALKPHOS 73 02/16/2020   BILITOT 0.3 02/16/2020   The 10-year ASCVD risk score Denman George DC Jr., et al., 2013) is: 1.1%   Values used to calculate the score:     Age: 55 years     Sex: Female     Is Non-Hispanic African American: Yes     Diabetic: No     Tobacco smoker: No     Systolic Blood Pressure: 100 mmHg     Is BP treated: No     HDL Cholesterol: 67 mg/dL     Total Cholesterol: 215 mg/dL  2. Vitamin D deficiency Improving, but not optimized. Current vitamin D is 46.1, tested on 02/16/2020. Optimal goal > 50 ng/dL.  She is taking vitamin D 50,000 IU weekly.  Plan: Continue to take prescription Vitamin D @50 ,000 IU every week as prescribed.  Follow-up for routine testing of Vitamin D, at least 2-3 times per year to avoid over-replacement.  - Refill Vitamin D, Ergocalciferol, (DRISDOL) 1.25 MG (50000 UNIT) CAPS capsule; Take 1 capsule (50,000 Units total) by mouth every 7 (seven) days.  Dispense: 4 capsule; Refill: 0  3. At risk for dehydration Sharon [name] is at higher than average risk of dehydration due to all the increase in exercise.  Sharon [name] was given more than 8 minutes of proper hydration counseling today.  We discussed the signs and symptoms of dehydration, some of which may include muscle cramping, constipation or even orthostatic symptoms.  Counseling on the prevention of dehydration was also provided today.  Sharon [name] is at risk for dehydration due to weight loss, lifestyle and behavorial habits and possibly due to taking certain medication(s).  She was encouraged to adequately hydrate and monitor fluid status to avoid dehydration as well as weight loss plateaus.  Unless pre-existing renal or cardiopulmonary conditions exist, in which patient was told to limit their fluid intake, I recommended roughly one  half of their weight in pounds to be the approximate ounces of non-caloric, non-caffeinated beverages they should drink per day; including more if they are engaging in exercise.  4. Class 1 obesity with serious comorbidity and body mass index (BMI) of 30.0 to 30.9 in adult, unspecified obesity type  Course: Sharon [name] is currently in the action stage of change. As such, her goal is to continue with weight loss efforts.   Nutrition goals: She has agreed to the Category 3 Plan with breakfast options.   Exercise goals: As is.  Behavioral modification strategies: increasing lean protein intake, decrease saturated and trans fats, increase water intake, meal planning and cooking strategies, celebration eating strategies and avoiding temptations.  Sharon [name] has agreed to follow-up with our clinic in 2-5 weeks. She was informed of the importance of frequent follow-up visits to maximize her success with intensive lifestyle modifications for her multiple health conditions.   Objective:   Blood pressure 111/72, pulse 77, temperature 97.9 F (36.6 C), height 5\' 9"  (1.753 m), weight 196 lb (88.9 kg), last menstrual period 03/27/2013, SpO2 94 %. Body mass index is 28.94 kg/m.  General: Cooperative, alert, well developed, in no acute distress. HEENT: Conjunctivae and lids unremarkable. Cardiovascular: Regular rhythm.  Lungs: Normal work of breathing. Neurologic: No focal deficits.   Lab Results  Component Value Date   CREATININE 0.88 02/16/2020   BUN 23 02/16/2020   NA 141 02/16/2020   K 5.3 (H) 02/16/2020   CL 101 02/16/2020   CO2 27 02/16/2020   Lab Results  Component Value Date   ALT 20 02/16/2020   AST 24 02/16/2020   ALKPHOS 73 02/16/2020   BILITOT 0.3 02/16/2020   Lab Results  Component Value Date   HGBA1C 5.9 (H) 02/16/2020   HGBA1C 6.1 (H) 10/30/2019   Lab Results  Component Value Date   INSULIN 10.9 02/16/2020   INSULIN 20.2 10/30/2019   Lab Results  Component Value Date   TSH  1.570 10/30/2019   Lab Results  Component Value Date   CHOL 215 (H) 02/16/2020   HDL 67 02/16/2020   LDLCALC 134 (H) 02/16/2020   TRIG 79 02/16/2020   CHOLHDL 3.2 02/16/2020   Lab Results  Component Value Date   WBC 5.0 10/30/2019   HGB 12.9 10/30/2019   HCT 39.4 10/30/2019   MCV 86 10/30/2019   PLT 245 10/30/2019   Attestation Statements:   Reviewed by clinician on day of visit: allergies, medications, problem list, medical history, surgical history, family history, social history, and previous encounter notes.  I, 12/30/2019, CMA, am acting as Insurance claims handler for Energy manager, DO.  I have reviewed the above documentation for accuracy and completeness, and I agree with the above. [employer], D.O.  The 21st Century Cures Act was signed into law in 2016 which includes the topic of electronic  health records.  This provides immediate access to information in MyChart.  This includes consultation notes, operative notes, office notes, lab results and pathology reports.  If you have any questions about what you read please let us know at your next visit so we can discuss your concerns and take corrective action if need be.  We are right here with you.

## 2020-03-23 ENCOUNTER — Ambulatory Visit (INDEPENDENT_AMBULATORY_CARE_PROVIDER_SITE_OTHER): Payer: BC Managed Care – PPO | Admitting: Sports Medicine

## 2020-03-23 ENCOUNTER — Other Ambulatory Visit: Payer: Self-pay

## 2020-03-23 ENCOUNTER — Encounter: Payer: Self-pay | Admitting: Sports Medicine

## 2020-03-23 VITALS — BP 100/60 | Ht 69.0 in | Wt 200.0 lb

## 2020-03-23 DIAGNOSIS — M25551 Pain in right hip: Secondary | ICD-10-CM | POA: Diagnosis not present

## 2020-03-23 MED ORDER — PREDNISONE 20 MG PO TABS: 20.0000 mg | ORAL_TABLET | Freq: Two times a day (BID) | ORAL | 0 refills | Status: DC

## 2020-03-23 NOTE — Progress Notes (Signed)
   PCP: Daisy Floro, MD  Subjective:   HPI: Patient is a 55 y.o. female here for reevaluation of right hip pain.  She was last seen here in 12/16/2019, at that time she is having catching pain deep in her groin and there was concern for right hip OA versus labral tear.  X-rays of the hip were obtained and showed some mild sclerosis but no significant osteoarthritis.  She was given a home exercise program and meloxicam as well.  Today, patient reports that she has been very diligent with her home exercise program but has not noticed much relief.  Meloxicam may be provide some relief but no significant relief.  For the most part, she describes certain movements of a "catching" pain in her hip with certain motions that she has trouble reproducing.  She definitely notices it with deep flexion of the hip and rotational maneuvers of the hip.  She sometimes notices it with tennis and recently had a stop in the middle of the game due to the pain.  She cannot recall any specific incident that incited the pain.  She has not noticed specific weakness, numbness, tingling.  Review of Systems:  Per HPI.   PMFSH, medications and smoking status reviewed.      Objective:  Physical Exam:  [facility] 12/16/2019  Frequency of aerobic exercise (# of days/week) 5  Average time in minutes 45  Frequency of strengthening activities (# of days/week) 5     Gen: awake, alert, NAD, comfortable in exam room Pulm: breathing unlabored  Right hip:  Inspection: No evidence of erythema, ecchymosis, swelling, edema  Palpation: No tenderness to palpation to greater trochanteric bursa, ASIS, AIIS.  Positive tenderness to palpation over the anterior aspect of her hip overlying the hip flexor tendons ROM: Mildly decreased ER of right hip compared to the left hip, intact IR Strength: Intact to resisted hip flexion/extension. Intact to resisted leg flexion/extension.  Some mild pain with  resisted hip flexion. Special tests: No leg length discrepancy. Neg log roll test.  Mildly positive FABER.  Mildly positive Thomas test with reproduction of right hip pain with flexion of left hip.  Mildly weak hip abduction strength   Assessment & Plan:  1.  Hip flexor tendinopathy  Differential includes hip flexor tendinopathy versus labral tear.  X-rays at last visit did not show a significant arthritis.  Plan: -Patient has a couple of matches coming up, after that she will rest for 2 weeks and start 7 days of prednisone 20 mg twice daily -Continue hip strengthening exercises focusing on hip flexion, abduction and external rotation, hold off on internal rotation for now -Follow-up in 1 month to reevaluate, if persistent pain would consider MRI to investigate for labral tear  Guy Sandifer, MD [facility] 03/23/2020 11:27 AM   I observed and examined the patient with the [facility] resident and agree with assessment and plan.  Note reviewed and modified by me. Sterling Big, MD

## 2020-03-31 ENCOUNTER — Other Ambulatory Visit: Payer: Self-pay

## 2020-03-31 ENCOUNTER — Encounter (INDEPENDENT_AMBULATORY_CARE_PROVIDER_SITE_OTHER): Payer: Self-pay | Admitting: Family Medicine

## 2020-03-31 ENCOUNTER — Ambulatory Visit (INDEPENDENT_AMBULATORY_CARE_PROVIDER_SITE_OTHER): Payer: BC Managed Care – PPO | Admitting: Family Medicine

## 2020-03-31 VITALS — BP 100/63 | HR 70 | Temp 98.3°F | Ht 69.0 in | Wt 197.0 lb

## 2020-03-31 DIAGNOSIS — Z9189 Other specified personal risk factors, not elsewhere classified: Secondary | ICD-10-CM | POA: Diagnosis not present

## 2020-03-31 DIAGNOSIS — K219 Gastro-esophageal reflux disease without esophagitis: Secondary | ICD-10-CM | POA: Diagnosis not present

## 2020-03-31 DIAGNOSIS — E669 Obesity, unspecified: Secondary | ICD-10-CM

## 2020-03-31 DIAGNOSIS — Z6830 Body mass index (BMI) 30.0-30.9, adult: Secondary | ICD-10-CM

## 2020-03-31 DIAGNOSIS — E559 Vitamin D deficiency, unspecified: Secondary | ICD-10-CM | POA: Diagnosis not present

## 2020-03-31 MED ORDER — VITAMIN D (ERGOCALCIFEROL) 1.25 MG (50000 UNIT) PO CAPS
50000.0000 [IU] | ORAL_CAPSULE | ORAL | 0 refills | Status: DC
Start: 2020-03-31 — End: 2020-04-28

## 2020-03-31 MED ORDER — PANTOPRAZOLE SODIUM 20 MG PO TBEC: 20.0000 mg | DELAYED_RELEASE_TABLET | Freq: Every day | ORAL | 0 refills | Status: DC

## 2020-04-12 ENCOUNTER — Other Ambulatory Visit: Payer: Self-pay | Admitting: Sports Medicine

## 2020-04-12 NOTE — Progress Notes (Signed)
Chief Complaint:   OBESITY Sharon [name] is here to discuss her progress with her obesity treatment plan along with follow-up of her obesity related diagnoses.   Today's visit was #: 9 Starting weight: 228 lbs Starting date: 10/30/2019 Today's weight: 197 lbs Today's date: 03/31/2020 Total lbs lost to date: 31 lbs Body mass index is 29.09 kg/m.  Total weight loss percentage to date: -13.60%  Interim History:  Sharon [name] says she is getting 10,000 steps per day and is "closing all rings" everyday on her Apple Watch.  She has been playing more tennis and has been going to the gym less.  She has been skipping meals.  She says her kids are home from school and going on a cruise next week as well.  Plan:  She will focus on breakfast and lunch while on the cruise, but will enjoy her meal at dinnertime but show restraint.  Current Meal Plan: the Category 3 Plan with breakfast options.  Current Exercise Plan:  Tennis and going to the gym for 90 minutes 5 times per week.  Assessment/Plan:   1. Vitamin D deficiency Improving, but not optimized. Current vitamin D is 46.1, tested on 02/16/2020. Optimal goal > 50 ng/dL.  She is taking vitamin D 50,000 IU weekly.  She denies issues or concerns.  Plan: Continue to take prescription Vitamin D @50 ,000 IU every week as prescribed.  Follow-up for routine testing of Vitamin D, at least 2-3 times per year to avoid over-replacement.  - Refill Vitamin D, Ergocalciferol, (DRISDOL) 1.25 MG (50000 UNIT) CAPS capsule; Take 1 capsule (50,000 Units total) by mouth every 7 (seven) days.  Dispense: 4 capsule; Refill: 0  2. Gastroesophageal reflux disease, unspecified whether esophagitis present We reviewed the diagnosis of GERD and importance of treatment. We discussed "red flag" symptoms and the importance of follow up if symptoms persisted despite treatment. We reviewed non-pharmacologic management of GERD symptoms: including: caffeine reduction, dietary changes, elevate  HOB, NPO after supper, reduction of alcohol intake, tobacco cessation, and weight loss.  She is taking Protonix 20 mg daily.  Denies issues or concerns.  Plan:  Will refill Protonix 20 mg daily, as per below.  - Refill pantoprazole (PROTONIX) 20 MG tablet; Take 1 tablet (20 mg total) by mouth daily.  Dispense: 30 tablet; Refill: 0  3. At risk for deficient intake of food Sharon [name] was given extensive education and counseling today of more than 8 minutes on risks associated with deficient food intake.  Counseled her on the importance of following our prescribed meal plan and eating adequate amounts of protein.  Discussed with Sharon [name] that inadequate food intake over longer periods of time can slow their metabolism down significantly.   4. Class 1 obesity with serious comorbidity and body mass index (BMI) of 30.0 to 30.9 in adult, unspecified obesity type  Course: Sharon [name] is currently in the action stage of change. As such, her goal is to continue with weight loss efforts.   Nutrition goals: She has agreed to the Category 3 Plan.   Exercise goals: As is.  Behavioral modification strategies: travel eating strategies and holiday eating strategies .  Sharon [name] has agreed to follow-up with our clinic in 3-4 weeks. She was informed of the importance of frequent follow-up visits to maximize her success with intensive lifestyle modifications for her multiple health conditions.   Objective:   Blood pressure 100/63, pulse 70, temperature 98.3 F (36.8 C), height 5\' 9"  (1.753 m), weight 197 lb (89.4 kg),  last menstrual period 03/27/2013, SpO2 96 %. Body mass index is 29.09 kg/m.  General: Cooperative, alert, well developed, in no acute distress. HEENT: Conjunctivae and lids unremarkable. Cardiovascular: Regular rhythm.  Lungs: Normal work of breathing. Neurologic: No focal deficits.   Lab Results  Component Value Date   CREATININE 0.88 02/16/2020   BUN 23 02/16/2020   NA 141 02/16/2020   K  5.3 (H) 02/16/2020   CL 101 02/16/2020   CO2 27 02/16/2020   Lab Results  Component Value Date   ALT 20 02/16/2020   AST 24 02/16/2020   ALKPHOS 73 02/16/2020   BILITOT 0.3 02/16/2020   Lab Results  Component Value Date   HGBA1C 5.9 (H) 02/16/2020   HGBA1C 6.1 (H) 10/30/2019   Lab Results  Component Value Date   INSULIN 10.9 02/16/2020   INSULIN 20.2 10/30/2019   Lab Results  Component Value Date   TSH 1.570 10/30/2019   Lab Results  Component Value Date   CHOL 215 (H) 02/16/2020   HDL 67 02/16/2020   LDLCALC 134 (H) 02/16/2020   TRIG 79 02/16/2020   CHOLHDL 3.2 02/16/2020   Lab Results  Component Value Date   WBC 5.0 10/30/2019   HGB 12.9 10/30/2019   HCT 39.4 10/30/2019   MCV 86 10/30/2019   PLT 245 10/30/2019   Attestation Statements:   Reviewed by clinician on day of visit: allergies, medications, problem list, medical history, surgical history, family history, social history, and previous encounter notes.  I, Insurance claims handler, CMA, am acting as Energy manager for [employer], DO.  I have reviewed the above documentation for accuracy and completeness, and I agree with the above. [name], D.O.  The 21st Century Cures Act was signed into law in 2016 which includes the topic of electronic health records.  This provides immediate access to information in MyChart.  This includes consultation notes, operative notes, office notes, lab results and pathology reports.  If you have any questions about what you read please let us know at your next visit so we can discuss your concerns and take corrective action if need be.  We are right here with you.  No orders of the defined types were placed in this encounter.   Medications Discontinued During This Encounter  Medication Reason  . pantoprazole (PROTONIX) 20 MG tablet Reorder  . Vitamin D, Ergocalciferol, (DRISDOL) 1.25 MG (50000 UNIT) CAPS capsule Reorder     Meds ordered this encounter   Medications  . pantoprazole (PROTONIX) 20 MG tablet    Sig: Take 1 tablet (20 mg total) by mouth daily.    Dispense:  30 tablet    Refill:  0  . Vitamin D, Ergocalciferol, (DRISDOL) 1.25 MG (50000 UNIT) CAPS capsule    Sig: Take 1 capsule (50,000 Units total) by mouth every 7 (seven) days.    Dispense:  4 capsule    Refill:  0

## 2020-04-13 NOTE — Progress Notes (Incomplete)
Left upper extremity dialysis graft  has been completed. Refer to [facility] under chart review to view preliminary results.   04/13/2020  4:19 PM Sturdivant, Gerarda Gunther

## 2020-04-23 ENCOUNTER — Other Ambulatory Visit (INDEPENDENT_AMBULATORY_CARE_PROVIDER_SITE_OTHER): Payer: Self-pay | Admitting: Family Medicine

## 2020-04-23 DIAGNOSIS — K219 Gastro-esophageal reflux disease without esophagitis: Secondary | ICD-10-CM

## 2020-04-26 ENCOUNTER — Ambulatory Visit (INDEPENDENT_AMBULATORY_CARE_PROVIDER_SITE_OTHER): Payer: BC Managed Care – PPO | Admitting: Family Medicine

## 2020-04-28 ENCOUNTER — Encounter (INDEPENDENT_AMBULATORY_CARE_PROVIDER_SITE_OTHER): Payer: Self-pay | Admitting: Family Medicine

## 2020-04-28 ENCOUNTER — Ambulatory Visit (INDEPENDENT_AMBULATORY_CARE_PROVIDER_SITE_OTHER): Payer: BC Managed Care – PPO | Admitting: Family Medicine

## 2020-04-28 ENCOUNTER — Other Ambulatory Visit: Payer: Self-pay

## 2020-04-28 VITALS — BP 106/61 | HR 65 | Temp 98.1°F | Ht 69.0 in | Wt 199.0 lb

## 2020-04-28 DIAGNOSIS — R7303 Prediabetes: Secondary | ICD-10-CM

## 2020-04-28 DIAGNOSIS — Z9189 Other specified personal risk factors, not elsewhere classified: Secondary | ICD-10-CM | POA: Diagnosis not present

## 2020-04-28 DIAGNOSIS — E7849 Other hyperlipidemia: Secondary | ICD-10-CM | POA: Diagnosis not present

## 2020-04-28 DIAGNOSIS — E669 Obesity, unspecified: Secondary | ICD-10-CM | POA: Diagnosis not present

## 2020-04-28 DIAGNOSIS — Z6833 Body mass index (BMI) 33.0-33.9, adult: Secondary | ICD-10-CM

## 2020-04-28 DIAGNOSIS — E559 Vitamin D deficiency, unspecified: Secondary | ICD-10-CM | POA: Diagnosis not present

## 2020-04-28 MED ORDER — VITAMIN D (ERGOCALCIFEROL) 1.25 MG (50000 UNIT) PO CAPS: 50000.0000 [IU] | ORAL_CAPSULE | ORAL | 0 refills | Status: DC

## 2020-05-04 NOTE — Telephone Encounter (Signed)
Pt last seen by Dr. Opalski.  

## 2020-05-04 NOTE — Progress Notes (Signed)
Chief Complaint:   OBESITY Sharon [name] is here to discuss her progress with her obesity treatment plan along with follow-up of her obesity related diagnoses.   Today's visit was #: 10 Starting weight: 228 lbs Starting date: 10/30/2019 Today's weight: 199 lbs Today's date: 04/28/2020 Total lbs lost to date: 29 lbs Body mass index is 29.39 kg/m.  Total weight loss percentage to date: -12.72%  Interim History:  Sharon [name] went on a cruise for 7 days and then drove her kids up to [place].  She was not really on the meal plan for the past several weeks, but she is ready to get back on it.  She likes the meal plan.  No concerns.  Current Meal Plan: the Category 3 Plan for 30% of the time.  Current Exercise Plan: Walking for 10,000 steps per day 7 days per week.  Assessment/Plan:   No orders of the defined types were placed in this encounter.   Medications Discontinued During This Encounter  Medication Reason  . predniSONE (DELTASONE) 20 MG tablet Error  . amitriptyline (ELAVIL) 25 MG tablet Error  . Vitamin D, Ergocalciferol, (DRISDOL) 1.25 MG (50000 UNIT) CAPS capsule Reorder     Meds ordered this encounter  Medications  . Vitamin D, Ergocalciferol, (DRISDOL) 1.25 MG (50000 UNIT) CAPS capsule    Sig: Take 1 capsule (50,000 Units total) by mouth every 7 (seven) days.    Dispense:  4 capsule    Refill:  0     1. Vitamin D deficiency Improving, but not optimized. Current vitamin D is 46.1, tested on 02/16/2020. Optimal goal > 50 ng/dL.   Plan:  Discussed labs with patient today.  Continue to take prescription Vitamin D @50 ,000 IU every week as prescribed.  Follow-up for routine testing of Vitamin D, at least 2-3 times per year to avoid over-replacement.  - Refill Vitamin D, Ergocalciferol, (DRISDOL) 1.25 MG (50000 UNIT) CAPS capsule; Take 1 capsule (50,000 Units total) by mouth every 7 (seven) days.  Dispense: 4 capsule; Refill: 0  2. Prediabetes Improving, but not optimized.  Goal is HgbA1c < 5.7.  Medication: None.    Plan:  Discussed labs with patient today.   She will continue to focus on protein-rich, low simple carbohydrate foods. We reviewed the importance of hydration, regular exercise for stress reduction, and restorative sleep.  Reviewed importance of decreasing simple carbohydrates, increasing protein, and continuing exercise and weight loss.  Lab Results  Component Value Date   HGBA1C 5.9 (H) 02/16/2020   Lab Results  Component Value Date   INSULIN 10.9 02/16/2020   INSULIN 20.2 10/30/2019   3. Other hyperlipidemia Course: Uncontrolled.  Lipid-lowering medications: None.   Plan:  Discussed labs with patient today.  Dietary changes: Increase soluble fiber, decrease simple carbohydrates, decrease saturated fat. Exercise changes: Moderate to vigorous-intensity aerobic activity 150 minutes per week or as tolerated. We will continue to monitor along with PCP/specialists as it pertains to her weight loss journey.  Recheck in another 2 months or so.  Continue prudent nutritional plan and exercise.  Lab Results  Component Value Date   CHOL 215 (H) 02/16/2020   HDL 67 02/16/2020   LDLCALC 134 (H) 02/16/2020   TRIG 79 02/16/2020   CHOLHDL 3.2 02/16/2020   Lab Results  Component Value Date   ALT 20 02/16/2020   AST 24 02/16/2020   ALKPHOS 73 02/16/2020   BILITOT 0.3 02/16/2020   The 10-year ASCVD risk score 02/18/2020 DC Denman George., et al.,  2013) is: 1.6%   Values used to calculate the score:     Age: 55 years     Sex: Female     Is Non-Hispanic African American: Yes     Diabetic: No     Tobacco smoker: No     Systolic Blood Pressure: 112 mmHg     Is BP treated: No     HDL Cholesterol: 67 mg/dL     Total Cholesterol: 215 mg/dL  4. At risk for diabetes mellitus - Sharon [name] was given diabetes prevention education and counseling today of more than 9 minutes.  - Counseled patient on pathophysiology of disease and meaning/ implication of lab results.  - Reviewed  how certain foods can either stimulate or inhibit insulin release, and subsequently affect hunger pathways  - Importance of following a healthy meal plan with limiting amounts of simple carbohydrates discussed with patient - Effects of regular aerobic exercise on blood sugar regulation reviewed and encouraged an eventual goal of 30 min 5d/week or more as a minimum.  - Briefly discussed treatment options, which always include dietary and lifestyle modification as first line.   - Handouts provided at patient's desire and/or told to go online to the American Diabetes Association website for further information.  5. Obesity with current BMI of 29.4  Course: Sharon [name] is currently in the action stage of change. As such, her goal is to continue with weight loss efforts.   Nutrition goals: She has agreed to the Category 3 Plan.   Exercise goals: As is.  Behavioral modification strategies: increasing lean protein intake, decreasing simple carbohydrates and planning for success.  Sharon [name] has agreed to follow-up with our clinic in 2-3 weeks. She was informed of the importance of frequent follow-up visits to maximize her success with intensive lifestyle modifications for her multiple health conditions.   Objective:   Blood pressure 106/61, pulse 65, temperature 98.1 F (36.7 C), height 5\' 9"  (1.753 m), weight 199 lb (90.3 kg), last menstrual period 03/27/2013, SpO2 95 %. Body mass index is 29.39 kg/m.  General: Cooperative, alert, well developed, in no acute distress. HEENT: Conjunctivae and lids unremarkable. Cardiovascular: Regular rhythm.  Lungs: Normal work of breathing. Neurologic: No focal deficits.   Lab Results  Component Value Date   CREATININE 0.88 02/16/2020   BUN 23 02/16/2020   NA 141 02/16/2020   K 5.3 (H) 02/16/2020   CL 101 02/16/2020   CO2 27 02/16/2020   Lab Results  Component Value Date   ALT 20 02/16/2020   AST 24 02/16/2020   ALKPHOS 73 02/16/2020   BILITOT 0.3  02/16/2020   Lab Results  Component Value Date   HGBA1C 5.9 (H) 02/16/2020   HGBA1C 6.1 (H) 10/30/2019   Lab Results  Component Value Date   INSULIN 10.9 02/16/2020   INSULIN 20.2 10/30/2019   Lab Results  Component Value Date   TSH 1.570 10/30/2019   Lab Results  Component Value Date   CHOL 215 (H) 02/16/2020   HDL 67 02/16/2020   LDLCALC 134 (H) 02/16/2020   TRIG 79 02/16/2020   CHOLHDL 3.2 02/16/2020   Lab Results  Component Value Date   WBC 5.0 10/30/2019   HGB 12.9 10/30/2019   HCT 39.4 10/30/2019   MCV 86 10/30/2019   PLT 245 10/30/2019   Attestation Statements:   Reviewed by clinician on day of visit: allergies, medications, problem list, medical history, surgical history, family history, social history, and previous encounter notes.  I, 12/30/2019, CMA,  am acting as Energy manager for [employer], DO.  I have reviewed the above documentation for accuracy and completeness, and I agree with the above. [name], D.O.  The 21st Century Cures Act was signed into law in 2016 which includes the topic of electronic health records.  This provides immediate access to information in MyChart.  This includes consultation notes, operative notes, office notes, lab results and pathology reports.  If you have any questions about what you read please let us know at your next visit so we can discuss your concerns and take corrective action if need be.  We are right here with you.

## 2020-05-06 ENCOUNTER — Ambulatory Visit (INDEPENDENT_AMBULATORY_CARE_PROVIDER_SITE_OTHER): Payer: BC Managed Care – PPO | Admitting: Sports Medicine

## 2020-05-06 ENCOUNTER — Other Ambulatory Visit: Payer: Self-pay

## 2020-05-06 DIAGNOSIS — M25551 Pain in right hip: Secondary | ICD-10-CM

## 2020-05-06 DIAGNOSIS — S46011A Strain of muscle(s) and tendon(s) of the rotator cuff of right shoulder, initial encounter: Secondary | ICD-10-CM | POA: Diagnosis not present

## 2020-05-06 DIAGNOSIS — M25511 Pain in right shoulder: Secondary | ICD-10-CM

## 2020-05-06 NOTE — Assessment & Plan Note (Signed)
Given HEP Ibuprofen  Will plan to schedule [place] to evaluate

## 2020-05-06 NOTE — Progress Notes (Addendum)
PCP: Daisy Floro, MD  Subjective:   HPI: Patient is a 55 y.o. female here for new right shoulder pain and chronic right hip pain.  Ibuprofen before playing tennis, helps, able to get through the match. Practice or plays 3-6 times/week. Has been doing PT since last visit, exercises as given daily now 4-5 times weekly and gym leg lifts 3 times weekly from 5-6 times. Stretching 3 times weekly with coach, feels tight on right hip. Sometimes heat pad. No ice. Does feel her hip catching with shooting pain occasionally, at baseline dull ache. Leg lifts and internal crossing hurts. Can't lift and internally rotate right hip to tie shoes. No specific numbness weakness or tingling.  Last here 03/23/20, got steroids for possible hip flexor tendinopathy versus labral tear. Prior Xray has not showed significant hip arthritis. Unable to tell if she got much relief from the steroids because she was on vacation and less active, less hurting overall though maybe due to rest. Came back after 1 week, drove to [place], the long car ride did exacerbate right hip pain.  Right shoulder pain started 2.5 to 3 weeks ago. Woke up and it was sore. No specific injury or motion, had been taking a break from tennis. Keeping her up at night. Started taking Ibuprofen to continue playing. At rest no pain. Raising, internal rotation, and behind back hurt.  Restarted amitryptiline about 1 week ago, has taken sporadically. Makes her sleepy but hasn't noticed difference in the pain.   Past Medical History:  Diagnosis Date  . Anemia   . Back pain   . Exertional asthma   . Female infertility   . GERD (gastroesophageal reflux disease)   . High cholesterol   . Right leg pain   . Vitamin D deficiency     Current Outpatient Medications on File Prior to Visit  Medication Sig Dispense Refill  . amitriptyline (ELAVIL) 25 MG tablet Take 25 mg by mouth at bedtime as needed for sleep.    Marland Kitchen aspirin 325 MG tablet Take 325 mg by  mouth at bedtime.    Marland Kitchen co-enzyme Q-10 30 MG capsule Take 30 mg by mouth at bedtime.    Marland Kitchen ibuprofen (ADVIL) 200 MG tablet Take 400 mg by mouth every 6 (six) hours as needed for moderate pain.    . meloxicam (MOBIC) 15 MG tablet TAKE 1 TABLET BY MOUTH EVERY DAY AS NEEDED FOR PAIN 30 tablet 3  . nitroGLYCERIN (NITRODUR - DOSED IN MG/24 HR) 0.2 mg/hr patch Use 1/4 patch daily to the affected area. (Patient taking differently: Place 0.05 mg onto the skin as needed (joint pain).) 30 patch 1  . pantoprazole (PROTONIX) 20 MG tablet Take 1 tablet (20 mg total) by mouth daily. 30 tablet 0  . Vitamin D, Ergocalciferol, (DRISDOL) 1.25 MG (50000 UNIT) CAPS capsule Take 1 capsule (50,000 Units total) by mouth every 7 (seven) days. 4 capsule 0   No current facility-administered medications on file prior to visit.    Past Surgical History:  Procedure Laterality Date  . CESAREAN SECTION    . LAPAROSCOPY      No Known Allergies  Social History   Socioeconomic History  . Marital status: Married    Spouse name: Alden Server  . Number of children: 3  . Years of education: 66  . Highest education level: Not on file  Occupational History  . Occupation: Homemaker  Tobacco Use  . Smoking status: Never Smoker  . Smokeless tobacco: Never Used  Substance and Sexual Activity  . Alcohol use: Yes    Comment: rare, social  . Drug use: No  . Sexual activity: Not on file  Other Topics Concern  . Not on file  Social History Narrative   Patient is married Alden Server) and lives at home with her family.   Patient has three children.   Patient is a homemaker.   Patient does drink caffeine maybe two cups per week.   Patient is right-handed.   Patient has a BS degree.            Social Determinants of Health   Financial Resource Strain: Not on file  Food Insecurity: Not on file  Transportation Needs: Not on file  Physical Activity: Not on file  Stress: Not on file  Social Connections: Not on file  Intimate  Partner Violence: Not on file    Family History  Problem Relation Age of Onset  . Diabetes Father   . Lung cancer Father   . Cancer Father   . High Cholesterol Mother     BP 112/78   Ht 5\' 9"  (1.753 m)   Wt 200 lb (90.7 kg)   LMP 03/27/2013   BMI 29.53 kg/m   [facility] 12/16/2019  Frequency of aerobic exercise (# of days/week) 5  Average time in minutes 45  Frequency of strengthening activities (# of days/week) 5    No flowsheet data found.  Review of Systems: See HPI above.     Objective:  Physical Exam:  Gen: NAD, comfortable in exam room  Right Shoulder Exam:  Inspection: Symmetric muscle mass, no atrophy or deformity, no scars. Palpation: No tenderness to palpation over and around the acromion, over the [facility] joint or biceps insertion. Range of motion: Limited range of motion in forward flexion above 90 degrees, abduction. Full extension.  Decreased external rotation compared to left shoulder Special tests: Positive empty can (supraspinatus), Neer's, and Hawkins test on right. Strength testing:  5 out of 5 strength with shoulder abduction, wrist extension and flexion and grip strength Sensation: Intact to light touch throughout bilateral upper extremities.  Brisk distal capillary refill.  Right Hip Exam: Palpation:  No tenderness to palpation over the greater trochanteric area Limited internal rotation (30 degrees on right compared to 40 degrees on left) and limited external rotation (40 degrees right vs. 45 degrees on left) Strength 5/5 in hip flexion, knee flexion and extension and dorsiflexion/extension bilaterally Pain with right logroll Pain elicited with right knee and hip flexed 90 degrees with internal rotation + FADIR test/ FABER is normal Trendelenburg's with no obvious pelvic stabilizer weakness Gait with no limp/ slight turnout of RT foot   Assessment & Plan:  55 year old female with chronic right hip pain, initially  thought possibly hip flexor tendinopathy but given lack of improvement with steroids, rest, and time and given focal pain and limitation with internal rotation, most concerning currently for labral tear. New right shoulder pain likely related to rotator cuff tendinopathy or bursitis given acute onsite and positive exam findings including Neer's and Hawkin's, secondary to tennis and worsened by sleeping on that shoulder.  1. Will schedule ultrasound of right shoulder and hip to evaluate further 2. MRI if ultrasound unrevealing, especially to evaluate for labral tear 3. Continue treatment with Aleve, ice 4. Supraspinatus strengthening exercise as demonstrated today  57, MD [facility], PGY-1 05/06/2020 3:52 PM  [email or web address]/[email or web address]/2020  I observed and examined the patient with the resident  and agree with assessment and plan.  Note reviewed and modified by me. Ila Mcgill, MD

## 2020-05-06 NOTE — Assessment & Plan Note (Signed)
This continues No Arthritis noted Suspect Labral injury  Will try to visualize on [place] (patient wants to wait on MRI unless necessary) Keep up hip exercises  Schedule for [place]

## 2020-05-06 NOTE — Patient Instructions (Signed)
Physical therapy exercises as instructed on the AVS Aleve for pain every 8 hours as needed Ice for 15 minutes after exercise

## 2020-05-20 ENCOUNTER — Encounter (INDEPENDENT_AMBULATORY_CARE_PROVIDER_SITE_OTHER): Payer: Self-pay | Admitting: Family Medicine

## 2020-05-20 ENCOUNTER — Other Ambulatory Visit: Payer: Self-pay

## 2020-05-20 ENCOUNTER — Ambulatory Visit (INDEPENDENT_AMBULATORY_CARE_PROVIDER_SITE_OTHER): Payer: BC Managed Care – PPO | Admitting: Family Medicine

## 2020-05-20 VITALS — BP 102/69 | HR 75 | Temp 98.3°F | Ht 69.0 in | Wt 195.0 lb

## 2020-05-20 DIAGNOSIS — E559 Vitamin D deficiency, unspecified: Secondary | ICD-10-CM

## 2020-05-20 DIAGNOSIS — K219 Gastro-esophageal reflux disease without esophagitis: Secondary | ICD-10-CM

## 2020-05-20 DIAGNOSIS — E669 Obesity, unspecified: Secondary | ICD-10-CM

## 2020-05-20 DIAGNOSIS — Z6833 Body mass index (BMI) 33.0-33.9, adult: Secondary | ICD-10-CM | POA: Diagnosis not present

## 2020-05-20 MED ORDER — PANTOPRAZOLE SODIUM 20 MG PO TBEC: 20.0000 mg | DELAYED_RELEASE_TABLET | Freq: Every day | ORAL | 0 refills | Status: DC

## 2020-05-20 MED ORDER — VITAMIN D (ERGOCALCIFEROL) 1.25 MG (50000 UNIT) PO CAPS: 50000.0000 [IU] | ORAL_CAPSULE | ORAL | 0 refills | Status: DC

## 2020-05-27 ENCOUNTER — Ambulatory Visit (INDEPENDENT_AMBULATORY_CARE_PROVIDER_SITE_OTHER): Payer: BC Managed Care – PPO | Admitting: Sports Medicine

## 2020-05-27 ENCOUNTER — Other Ambulatory Visit: Payer: Self-pay

## 2020-05-27 ENCOUNTER — Ambulatory Visit: Payer: Self-pay

## 2020-05-27 VITALS — BP 110/74 | Ht 68.5 in | Wt 200.0 lb

## 2020-05-27 DIAGNOSIS — M25551 Pain in right hip: Secondary | ICD-10-CM | POA: Diagnosis not present

## 2020-05-27 DIAGNOSIS — S46011D Strain of muscle(s) and tendon(s) of the rotator cuff of right shoulder, subsequent encounter: Secondary | ICD-10-CM

## 2020-05-27 DIAGNOSIS — S46011A Strain of muscle(s) and tendon(s) of the rotator cuff of right shoulder, initial encounter: Secondary | ICD-10-CM

## 2020-05-27 DIAGNOSIS — M7530 Calcific tendinitis of unspecified shoulder: Secondary | ICD-10-CM | POA: Diagnosis not present

## 2020-05-27 NOTE — Assessment & Plan Note (Signed)
On [place] today there is calcific change Edema noted  Trial with ESWT If she responds to Tx 1 we will schedule repeat sessions Otherwise we will got to NTG protocol

## 2020-05-27 NOTE — Assessment & Plan Note (Signed)
This is suspicious for labrum injury I suggested conservative care with continued HEP Easy motion  If too painful consider CSI with [place]

## 2020-05-27 NOTE — Progress Notes (Signed)
CC; RT hip and RT shoulder pain  Both are limiting her tennis  The RT hip is brought back for [place] assessment as her exam suggests a torn labrum Still hurts but able to play tennis  Rt shoulder Chronic pain with some grinding feeling Does not feel week No night pain Worse at times with tennis  No neck pain  PE Pleasant B F in NAD BP 110/74   Ht 5' 8.5" (1.74 m)   Wt 200 lb (90.7 kg)   LMP 03/27/2013   BMI 29.97 kg/m  [facility] 12/16/2019  Frequency of aerobic exercise (# of days/week) 5  Average time in minutes 45  Frequency of strengthening activities (# of days/week) 5   Shoulder: RT Inspection reveals no abnormalities, atrophy or asymmetry. Palpation is normal with no tenderness over AC joint or bicipital groove. She gets some pain with cross over ROM is full in all planes. Rotator cuff strength normal throughout. No signs of impingement with negative Neer and empty can. Feels some grinding in shoulder with Hawkins test but not weak Speeds and Yergason's tests normal. No labral pathology noted with negative Obrien's, negative clunk and good stability. Normal scapular function observed. No painful arc and no drop arm sign. No apprehension sign  RT Hip Rotational motion is 20 deg IR and 30 ER Some pain on FADIR motions Comparison with left hip with 35 IR and 45 ER  Ultrasound of RT shoulder Biceps tendon normal in SAX and LAX Infraspinatus and teres minor normal Subscapularis normal with normal motion Supraspinatus shows some hypoechoic change and calcifications at the foot plate AC joint is normal  Impression:  Mild calcific tendinopathy of suprapinatus  Ultrasound of Right Hip Femoral head and neck are intact Shallow compression curve No joint effusion  Labrum is visualized with calcifications Hypoechoic change at base of labrum  Impression:  Findings suggest probable degenerative change in anterior labrum  Plan:  Trial  today of ESWT and if hepful will do weekly x 5 to 6   Extracorporeal Shockwave Therapy to Subacromial Area:   Treatment number: 1  Risks and benefits of procedure discussed, Patient opted to proceed. Written Consent obtained.  Timeout performed.   Patient was placed in Crass position.   Power Level:  100 Frequency: 14 Impulse/cycles: 2000 Head size: Large  Patient tolerated the procedure very well with no immediate complications.  Aftercare, expectations, and return precautions were discussed.

## 2020-05-31 NOTE — Progress Notes (Signed)
Chief Complaint:   OBESITY Sharon [name] is here to discuss her progress with her obesity treatment plan along with follow-up of her obesity related diagnoses.   Today's visit was #: 11 Starting weight: 228 lbs Starting date: 10/30/2019 Today's weight: 195 lbs Today's date: 05/20/2020 Total lbs lost to date: 33 lbs Body mass index is 28.8 kg/m.  Total weight loss percentage to date: -14.47%  Interim History:  Sharon [name] is here for a follow up office visit and she is following the meal plan without concerns or issues.  Patient's meal and food recall appears to be accurate and consistent with what is on the plan.  When on plan, her hunger and cravings are well controlled.    Plan:  In around 1 month, will check CMP (potassium and liver enzymes) vitamin D, and A1c.  Current Meal Plan: the Category 3 Plan for 40% of the time.  Current Exercise Plan: Pilates, playing tennis, and going to the gym for 45 minutes 5 times per week.  Assessment/Plan:   1. Gastroesophageal reflux disease, unspecified whether esophagitis present Sharon [name] is taking Protonix 20 mg daily.  We reviewed the diagnosis of GERD and importance of treatment. We discussed "red flag" symptoms and the importance of follow up if symptoms persisted despite treatment. We reviewed non-pharmacologic management of GERD symptoms: including: caffeine reduction, dietary changes, elevate HOB, NPO after supper, reduction of alcohol intake, tobacco cessation, and weight loss.  Plan:  Will refill Protonix, as per below.  - Refill pantoprazole (PROTONIX) 20 MG tablet; Take 1 tablet (20 mg total) by mouth daily.  Dispense: 30 tablet; Refill: 0  2. Vitamin D deficiency Improving, but not optimized. Current vitamin D is 46.1, tested on 02/16/2020. Optimal goal > 50 ng/dL.  She is taking vitamin D 50,000 IU weekly.  Plan: Continue to take prescription Vitamin D @50 ,000 IU every week as prescribed.  Follow-up for routine testing of Vitamin  D, at least 2-3 times per year to avoid over-replacement.  - Refill Vitamin D, Ergocalciferol, (DRISDOL) 1.25 MG (50000 UNIT) CAPS capsule; Take 1 capsule (50,000 Units total) by mouth every 7 (seven) days.  Dispense: 4 capsule; Refill: 0  3. Obesity, current BMI 28.8  Course: Sharon [name] is currently in the action stage of change. As such, her goal is to continue with weight loss efforts.   Nutrition goals: She has agreed to keeping a food journal and adhering to recommended goals of 1600-1700 calories and 120+ grams of protein.   Exercise goals: As is.  Behavioral modification strategies: meal planning and cooking strategies, keeping healthy foods in the home and planning for success.  Sharon [name] has agreed to follow-up with our clinic in 2-3 weeks. She was informed of the importance of frequent follow-up visits to maximize her success with intensive lifestyle modifications for her multiple health conditions.   Objective:   Blood pressure 102/69, pulse 75, temperature 98.3 F (36.8 C), height 5\' 9"  (1.753 m), weight 195 lb (88.5 kg), last menstrual period 03/27/2013, SpO2 95 %. Body mass index is 28.8 kg/m.  General: Cooperative, alert, well developed, in no acute distress. HEENT: Conjunctivae and lids unremarkable. Cardiovascular: Regular rhythm.  Lungs: Normal work of breathing. Neurologic: No focal deficits.   Lab Results  Component Value Date   CREATININE 0.88 02/16/2020   BUN 23 02/16/2020   NA 141 02/16/2020   K 5.3 (H) 02/16/2020   CL 101 02/16/2020   CO2 27 02/16/2020   Lab Results  Component Value  Date   ALT 20 02/16/2020   AST 24 02/16/2020   ALKPHOS 73 02/16/2020   BILITOT 0.3 02/16/2020   Lab Results  Component Value Date   HGBA1C 5.9 (H) 02/16/2020   HGBA1C 6.1 (H) 10/30/2019   Lab Results  Component Value Date   INSULIN 10.9 02/16/2020   INSULIN 20.2 10/30/2019   Lab Results  Component Value Date   TSH 1.570 10/30/2019   Lab Results  Component Value Date    CHOL 215 (H) 02/16/2020   HDL 67 02/16/2020   LDLCALC 134 (H) 02/16/2020   TRIG 79 02/16/2020   CHOLHDL 3.2 02/16/2020   Lab Results  Component Value Date   WBC 5.0 10/30/2019   HGB 12.9 10/30/2019   HCT 39.4 10/30/2019   MCV 86 10/30/2019   PLT 245 10/30/2019   Attestation Statements:   Reviewed by clinician on day of visit: allergies, medications, problem list, medical history, surgical history, family history, social history, and previous encounter notes.  I, Insurance claims handler, CMA, am acting as Energy manager for [employer], DO.  I have reviewed the above documentation for accuracy and completeness, and I agree with the above. [name], D.O.  The 21st Century Cures Act was signed into law in 2016 which includes the topic of electronic health records.  This provides immediate access to information in MyChart.  This includes consultation notes, operative notes, office notes, lab results and pathology reports.  If you have any questions about what you read please let us know at your next visit so we can discuss your concerns and take corrective action if need be.  We are right here with you.

## 2020-06-03 ENCOUNTER — Other Ambulatory Visit (INDEPENDENT_AMBULATORY_CARE_PROVIDER_SITE_OTHER): Payer: Self-pay | Admitting: Family Medicine

## 2020-06-03 DIAGNOSIS — K219 Gastro-esophageal reflux disease without esophagitis: Secondary | ICD-10-CM

## 2020-06-03 NOTE — Telephone Encounter (Signed)
Dr.Opalski ?

## 2020-06-10 ENCOUNTER — Other Ambulatory Visit (INDEPENDENT_AMBULATORY_CARE_PROVIDER_SITE_OTHER): Payer: Self-pay | Admitting: Family Medicine

## 2020-06-10 ENCOUNTER — Encounter (INDEPENDENT_AMBULATORY_CARE_PROVIDER_SITE_OTHER): Payer: Self-pay | Admitting: Family Medicine

## 2020-06-10 ENCOUNTER — Other Ambulatory Visit: Payer: Self-pay

## 2020-06-10 ENCOUNTER — Ambulatory Visit (INDEPENDENT_AMBULATORY_CARE_PROVIDER_SITE_OTHER): Payer: BC Managed Care – PPO | Admitting: Family Medicine

## 2020-06-10 VITALS — BP 97/69 | HR 71 | Temp 98.2°F | Ht 69.0 in | Wt 200.0 lb

## 2020-06-10 DIAGNOSIS — E669 Obesity, unspecified: Secondary | ICD-10-CM

## 2020-06-10 DIAGNOSIS — Z9189 Other specified personal risk factors, not elsewhere classified: Secondary | ICD-10-CM | POA: Diagnosis not present

## 2020-06-10 DIAGNOSIS — Z6833 Body mass index (BMI) 33.0-33.9, adult: Secondary | ICD-10-CM

## 2020-06-10 DIAGNOSIS — R7303 Prediabetes: Secondary | ICD-10-CM

## 2020-06-10 DIAGNOSIS — G479 Sleep disorder, unspecified: Secondary | ICD-10-CM | POA: Diagnosis not present

## 2020-06-10 DIAGNOSIS — E559 Vitamin D deficiency, unspecified: Secondary | ICD-10-CM

## 2020-06-10 MED ORDER — VITAMIN D (ERGOCALCIFEROL) 1.25 MG (50000 UNIT) PO CAPS: 50000.0000 [IU] | ORAL_CAPSULE | ORAL | 0 refills | Status: DC

## 2020-06-10 MED ORDER — OZEMPIC (0.25 OR 0.5 MG/DOSE) 2 MG/1.5ML ~~LOC~~ SOPN: 0.5000 mg | PEN_INJECTOR | SUBCUTANEOUS | 0 refills | Status: DC

## 2020-06-10 MED ORDER — AMITRIPTYLINE HCL 25 MG PO TABS: 25.0000 mg | ORAL_TABLET | Freq: Every evening | ORAL | 0 refills | Status: DC | PRN

## 2020-06-15 NOTE — Telephone Encounter (Signed)
PA is being worked on

## 2020-06-15 NOTE — Telephone Encounter (Signed)
Pt last seen by Dr. Opalski.  

## 2020-06-16 ENCOUNTER — Encounter (INDEPENDENT_AMBULATORY_CARE_PROVIDER_SITE_OTHER): Payer: Self-pay | Admitting: Family Medicine

## 2020-06-16 ENCOUNTER — Encounter (INDEPENDENT_AMBULATORY_CARE_PROVIDER_SITE_OTHER): Payer: Self-pay

## 2020-06-16 ENCOUNTER — Other Ambulatory Visit: Payer: Self-pay | Admitting: *Deleted

## 2020-06-16 DIAGNOSIS — M25551 Pain in right hip: Secondary | ICD-10-CM

## 2020-06-16 NOTE — Progress Notes (Signed)
Chief Complaint:   OBESITY Sharon [name] is here to discuss her progress with her obesity treatment plan along with follow-up of her obesity related diagnoses.   Today's visit was #: 12 Starting weight: 228 lbs Starting date: 10/30/2019 Today's weight: 200 lbs Today's date: 06/10/2020 Weight change since last visit: +5 lbs Total lbs lost to date: +2 lbs Body mass index is 29.53 kg/m.   Interim History:  Sharon [name] has been traveling a lot more lately and has more travel coming up.  Still getting 10,000 steps per day, but is doing more snacking.  Current Meal Plan: keeping a food journal and adhering to recommended goals of 1600-1700 calories and 120 grams of protein for 50% of the time.  Current Exercise Plan: Playing tennis, going to the gym, doing pilates for 90 minutes 5 times per week.  Assessment/Plan:   Medications Discontinued During This Encounter  Medication Reason  . amitriptyline (ELAVIL) 25 MG tablet Reorder  . Vitamin D, Ergocalciferol, (DRISDOL) 1.25 MG (50000 UNIT) CAPS capsule Reorder   Meds ordered this encounter  Medications  . Semaglutide,0.25 or 0.5MG /DOS, (OZEMPIC, 0.25 OR 0.5 MG/DOSE,) 2 MG/1.5ML SOPN    Sig: Inject 0.5 mg into the skin once a week.    Dispense:  1.5 mL    Refill:  0  . Vitamin D, Ergocalciferol, (DRISDOL) 1.25 MG (50000 UNIT) CAPS capsule    Sig: Take 1 capsule (50,000 Units total) by mouth every 7 (seven) days.    Dispense:  4 capsule    Refill:  0  . amitriptyline (ELAVIL) 25 MG tablet    Sig: Take 1 tablet (25 mg total) by mouth at bedtime as needed for sleep.    Dispense:  30 tablet    Refill:  0    1. Prediabetes Not at goal. Goal is HgbA1c < 5.7.  Medication: None.  No history of pancreatitis or family history of thyroid cancer.  Plan:  Start Ozempic 0.5 mg subcutaneously weekly, as per below.  She will continue to focus on protein-rich, low simple carbohydrate foods. We reviewed the importance of hydration, regular exercise for stress  reduction, and restorative sleep.   Lab Results  Component Value Date   HGBA1C 5.9 (H) 02/16/2020   Lab Results  Component Value Date   INSULIN 10.9 02/16/2020   INSULIN 20.2 10/30/2019   - Start Semaglutide,0.25 or 0.5MG /DOS, (OZEMPIC, 0.25 OR 0.5 MG/DOSE,) 2 MG/1.5ML SOPN; Inject 0.5 mg into the skin once a week.  Dispense: 1.5 mL; Refill: 0  2. Sleep difficulties Sharon [name] is taking Elavil 25 mg at bedtime as needed for sleep.  She says this helps with her hip pain as well.  Plan:  Refill Elavil 25 mg nightly, as per below.  - Refill amitriptyline (ELAVIL) 25 MG tablet; Take 1 tablet (25 mg total) by mouth at bedtime as needed for sleep.  Dispense: 30 tablet; Refill: 0  3. Vitamin D deficiency Improving, but not optimized. Current vitamin D is 46.1, tested on 02/16/2020. Optimal goal > 50 ng/dL.  She is taking vitamin D 50,000 IU weekly.  Plan: Continue to take prescription Vitamin D @50 ,000 IU every week as prescribed.  Follow-up for routine testing of Vitamin D, at least 2-3 times per year to avoid over-replacement.  - Refill Vitamin D, Ergocalciferol, (DRISDOL) 1.25 MG (50000 UNIT) CAPS capsule; Take 1 capsule (50,000 Units total) by mouth every 7 (seven) days.  Dispense: 4 capsule; Refill: 0  4. At risk for side effect of medication  Due to Sharon [name]'s current conditions and medications, she is at a higher risk for drug side effect.  At least 10 minutes was spent on counseling her about these concerns today.  We discussed the benefits and potential risks of these medications, and all of patient's concerns were addressed and questions were answered.  she will call us, or their PCP or other specialists who treat their conditions with medications, with any questions or concerns that may develop.    5. Obesity, current BMI 29.5  Course: Sharon [name] is currently in the action stage of change. As such, her goal is to continue with weight loss efforts.   Nutrition goals: She has agreed to keeping a  food journal and adhering to recommended goals of 1600-1700 calories and 120 grams of protein.   Exercise goals: As is.  Behavioral modification strategies: increasing lean protein intake, decreasing simple carbohydrates, meal planning and cooking strategies and planning for success.  Sharon [name] has agreed to follow-up with our clinic in 3-4 weeks. She was informed of the importance of frequent follow-up visits to maximize her success with intensive lifestyle modifications for her multiple health conditions.   Objective:   Blood pressure 97/69, pulse 71, temperature 98.2 F (36.8 C), height 5\' 9"  (1.753 m), weight 200 lb (90.7 kg), last menstrual period 03/27/2013, SpO2 99 %. Body mass index is 29.53 kg/m.  General: Cooperative, alert, well developed, in no acute distress. HEENT: Conjunctivae and lids unremarkable. Cardiovascular: Regular rhythm.  Lungs: Normal work of breathing. Neurologic: No focal deficits.   Lab Results  Component Value Date   CREATININE 0.88 02/16/2020   BUN 23 02/16/2020   NA 141 02/16/2020   K 5.3 (H) 02/16/2020   CL 101 02/16/2020   CO2 27 02/16/2020   Lab Results  Component Value Date   ALT 20 02/16/2020   AST 24 02/16/2020   ALKPHOS 73 02/16/2020   BILITOT 0.3 02/16/2020   Lab Results  Component Value Date   HGBA1C 5.9 (H) 02/16/2020   HGBA1C 6.1 (H) 10/30/2019   Lab Results  Component Value Date   INSULIN 10.9 02/16/2020   INSULIN 20.2 10/30/2019   Lab Results  Component Value Date   TSH 1.570 10/30/2019   Lab Results  Component Value Date   CHOL 215 (H) 02/16/2020   HDL 67 02/16/2020   LDLCALC 134 (H) 02/16/2020   TRIG 79 02/16/2020   CHOLHDL 3.2 02/16/2020   Lab Results  Component Value Date   WBC 5.0 10/30/2019   HGB 12.9 10/30/2019   HCT 39.4 10/30/2019   MCV 86 10/30/2019   PLT 245 10/30/2019   Attestation Statements:   Reviewed by clinician on day of visit: allergies, medications, problem list, medical history, surgical  history, family history, social history, and previous encounter notes.  I, 12/30/2019, CMA, am acting as Insurance claims handler for Energy manager, DO.  I have reviewed the above documentation for accuracy and completeness, and I agree with the above. [employer], D.O.  The 21st Century Cures Act was signed into law in 2016 which includes the topic of electronic health records.  This provides immediate access to information in MyChart.  This includes consultation notes, operative notes, office notes, lab results and pathology reports.  If you have any questions about what you read please let 2017 know at your next visit so we can discuss your concerns and take corrective action if need be.  We are right here with you.

## 2020-06-16 NOTE — Telephone Encounter (Signed)
Pt last seen by Dr. Opalski.  

## 2020-06-23 ENCOUNTER — Encounter (INDEPENDENT_AMBULATORY_CARE_PROVIDER_SITE_OTHER): Payer: Self-pay | Admitting: Family Medicine

## 2020-06-23 NOTE — Telephone Encounter (Signed)
Please see previous message sent to patient.  

## 2020-06-30 ENCOUNTER — Other Ambulatory Visit (INDEPENDENT_AMBULATORY_CARE_PROVIDER_SITE_OTHER): Payer: Self-pay | Admitting: Family Medicine

## 2020-06-30 DIAGNOSIS — E559 Vitamin D deficiency, unspecified: Secondary | ICD-10-CM

## 2020-07-01 NOTE — Telephone Encounter (Signed)
Pt last seen by Dr. Opalski.  

## 2020-07-02 ENCOUNTER — Other Ambulatory Visit (INDEPENDENT_AMBULATORY_CARE_PROVIDER_SITE_OTHER): Payer: Self-pay | Admitting: Family Medicine

## 2020-07-02 DIAGNOSIS — K219 Gastro-esophageal reflux disease without esophagitis: Secondary | ICD-10-CM

## 2020-07-02 DIAGNOSIS — G479 Sleep disorder, unspecified: Secondary | ICD-10-CM

## 2020-07-05 NOTE — Telephone Encounter (Signed)
Dr.Opalski ?

## 2020-07-12 ENCOUNTER — Other Ambulatory Visit: Payer: Self-pay

## 2020-07-12 ENCOUNTER — Encounter: Payer: Self-pay | Admitting: Orthopaedic Surgery

## 2020-07-12 ENCOUNTER — Ambulatory Visit (INDEPENDENT_AMBULATORY_CARE_PROVIDER_SITE_OTHER): Payer: BC Managed Care – PPO | Admitting: Orthopaedic Surgery

## 2020-07-12 DIAGNOSIS — M25551 Pain in right hip: Secondary | ICD-10-CM | POA: Diagnosis not present

## 2020-07-12 NOTE — Progress Notes (Signed)
arthro

## 2020-07-12 NOTE — Progress Notes (Signed)
Office Visit Note   Patient: Sharon [name]           Date of Birth: Nov 16, 1965           MRN: [id number] Visit Date: 07/12/2020              Requested by: Enid Baas, MD [id number] [place],  [place] [address] PCP: Daisy Floro, MD   Assessment & Plan: Visit Diagnoses:  1. Pain in right hip     Plan: Given her clinical exam of her right hip combined with the ultrasound findings, there is suspicion for a labral tear of the right hip joint.  I have recommended a MRI arthrogram to confirm whether or not there is a tear of the labrum of the right hip as well as to assess the cartilage of the acetabulum and femoral head given the pain she is having with the right hip.  All question concerns were answered and addressed.  We will see her back after the MRI arthrogram of the right hip.  Follow-Up Instructions: No follow-ups on file.   Orders:  No orders of the defined types were placed in this encounter.  No orders of the defined types were placed in this encounter.     Procedures: No procedures performed   Clinical Data: No additional findings.   Subjective: Chief Complaint  Patient presents with   Right Hip - Pain  The patient is a 55 year old female sent from Dr. Darrick [name] from sports medicine to evaluate and treat right hip pain and the possibility of a right hip labral tear.  She is an avid Armed forces operational officer and has had pain with rotation and weightbearing activities in that right hip in the groin area since September of last year.  Sometimes is on the lateral aspect of her hip and it does hurt with weightbearing and symptoms get worse as the day goes on.  Dr. Darrick [name] did perform an ultrasound of her right hip that was suspicious for a labral tear.  The plain films of the right hip showed a well-maintained joint space.  She denies any numbness and tingling going down her right leg and does take Aleve for pain.  She is worked on activity modification as  well. HPI  Review of Systems There is currently listed no headache, chest pain, shortness of breath, fever, chills, nausea, vomiting  Objective: Vital Signs: LMP 03/27/2013   Physical Exam She is alert and orient x3 and in no acute distress Ortho Exam Examination of her left hip shows fluid and full range of motion with no pain at all.  Examination right hip shows some stiffness with the extremes of rotation and a lot of pain in the groin with compression of the right hip and rotation of the right hip. Specialty Comments:  No specialty comments available.  Imaging: No results found. Plain films of the right hip on the canopy system were independently reviewed and show no acute findings and a well-maintained joint space.  PMFS History: Patient Active Problem List   Diagnosis Date Noted   Sleep difficulties 06/10/2020   At risk for side effect of medication 06/10/2020   Calcific supraspinatus tendinitis 05/27/2020   Rotator cuff strain, right, initial encounter 05/06/2020   At risk for dehydration 03/18/2020   At risk for malnutrition 01/29/2020   Gastroesophageal reflux disease 01/07/2020   At risk for deficient intake of food 01/07/2020   Arthralgia 12/24/2019   At risk for activity intolerance 12/24/2019  Plantar fasciitis of left foot 12/16/2019   At risk for impaired metabolic function 12/04/2019   Prediabetes 11/13/2019   Elevated liver enzymes 11/13/2019   Other hyperlipidemia 11/13/2019   Vitamin D deficiency 11/13/2019   At risk for heart disease 11/13/2019   Left anterior shoulder pain 03/19/2018   Right hip pain 01/14/2018   Lower extremity pain 05/14/2013   Paresthesia 03/27/2013   Neck pain 03/27/2013   Low back pain radiating to left leg 03/06/2011   ELBOW PAIN, RIGHT 05/07/2009   Right lateral epicondylitis 05/07/2009   Past Medical History:  Diagnosis Date   Anemia    Back pain    Exertional asthma    Female infertility    GERD (gastroesophageal  reflux disease)    High cholesterol    Right leg pain    Vitamin D deficiency     Family History  Problem Relation Age of Onset   Diabetes Father    Lung cancer Father    Cancer Father    High Cholesterol Mother     Past Surgical History:  Procedure Laterality Date   CESAREAN SECTION     LAPAROSCOPY     Social History   Occupational History   Occupation: Futures trader  Tobacco Use   Smoking status: Never   Smokeless tobacco: Never  Substance and Sexual Activity   Alcohol use: Yes    Comment: rare, social   Drug use: No   Sexual activity: Not on file

## 2020-07-14 ENCOUNTER — Encounter (INDEPENDENT_AMBULATORY_CARE_PROVIDER_SITE_OTHER): Payer: Self-pay | Admitting: Family Medicine

## 2020-07-14 ENCOUNTER — Other Ambulatory Visit: Payer: Self-pay

## 2020-07-14 ENCOUNTER — Ambulatory Visit (INDEPENDENT_AMBULATORY_CARE_PROVIDER_SITE_OTHER): Payer: BC Managed Care – PPO | Admitting: Family Medicine

## 2020-07-14 VITALS — BP 95/67 | HR 74 | Temp 98.3°F | Ht 69.0 in | Wt 205.0 lb

## 2020-07-14 DIAGNOSIS — Z6833 Body mass index (BMI) 33.0-33.9, adult: Secondary | ICD-10-CM

## 2020-07-14 DIAGNOSIS — K219 Gastro-esophageal reflux disease without esophagitis: Secondary | ICD-10-CM

## 2020-07-14 DIAGNOSIS — E559 Vitamin D deficiency, unspecified: Secondary | ICD-10-CM | POA: Diagnosis not present

## 2020-07-14 DIAGNOSIS — E669 Obesity, unspecified: Secondary | ICD-10-CM

## 2020-07-14 DIAGNOSIS — Z9189 Other specified personal risk factors, not elsewhere classified: Secondary | ICD-10-CM

## 2020-07-14 MED ORDER — VITAMIN D (ERGOCALCIFEROL) 1.25 MG (50000 UNIT) PO CAPS: 50000.0000 [IU] | ORAL_CAPSULE | ORAL | 0 refills | Status: DC

## 2020-07-14 MED ORDER — PANTOPRAZOLE SODIUM 20 MG PO TBEC: 20.0000 mg | DELAYED_RELEASE_TABLET | Freq: Every day | ORAL | 0 refills | Status: DC

## 2020-07-21 NOTE — Progress Notes (Signed)
Chief Complaint:   OBESITY Sharon [name] is here to discuss her progress with her obesity treatment plan along with follow-up of her obesity related diagnoses.   Today's visit was #: 13 Starting weight: 228 lbs Starting date: 10/30/2019 Today's weight: 205 lbs Today's date: 07/14/2020 Weight change since last visit: +5 lbs Total lbs lost to date: 23 lbs Body mass index is 30.27 kg/m.  Total weight loss percentage to date: -10.09%  Interim History: Sharon [name] did more celebration eating and some stress eating as well.  No time for gym lately.  Her daughter recently had major back surgery and her kids are home from boarding school, so there are more snacks/unhealthy food items in the house.  Plan:  Try not to skip meals.  Get back to working out or playing tennis.  Current Meal Plan: keeping a food journal and adhering to recommended goals of 1600-1700 calories and 120 grams of protein for 10% of the time.  Current Exercise Plan: None. Current Anti-Obesity Medications: Ozempic 0.25 mg subcutaneously weekly. Side effects: None.  Assessment/Plan:   Medications Discontinued During This Encounter  Medication Reason   pantoprazole (PROTONIX) 20 MG tablet Reorder   Vitamin D, Ergocalciferol, (DRISDOL) 1.25 MG (50000 UNIT) CAPS capsule Reorder   Meds ordered this encounter  Medications   Vitamin D, Ergocalciferol, (DRISDOL) 1.25 MG (50000 UNIT) CAPS capsule    Sig: Take 1 capsule (50,000 Units total) by mouth every 7 (seven) days.    Dispense:  4 capsule    Refill:  0   pantoprazole (PROTONIX) 20 MG tablet    Sig: Take 1 tablet (20 mg total) by mouth daily.    Dispense:  30 tablet    Refill:  0   1. Gastroesophageal reflux disease, unspecified whether esophagitis present Sharon [name] is taking Protonix 20 mg daily.  Plan:  Continue Protonix at current dose.  Will refill today.  We reviewed the diagnosis of GERD and importance of treatment. We discussed "red flag" symptoms and the importance of  follow up if symptoms persisted despite treatment. We reviewed non-pharmacologic management of GERD symptoms: including: caffeine reduction, dietary changes, elevate HOB, NPO after supper, reduction of alcohol intake, tobacco cessation, and weight loss.  - Refill pantoprazole (PROTONIX) 20 MG tablet; Take 1 tablet (20 mg total) by mouth daily.  Dispense: 30 tablet; Refill: 0  2. Vitamin D deficiency Improving, but not optimized.  She is taking vitamin D 50,000 IU weekly.  Plan: Continue to take prescription Vitamin D @50 ,000 IU every week as prescribed.  Follow-up for routine testing of Vitamin D, at least 2-3 times per year to avoid over-replacement.  Lab Results  Component Value Date   VD25OH 46.1 02/16/2020   VD25OH 40.6 10/30/2019   - Refill Vitamin D, Ergocalciferol, (DRISDOL) 1.25 MG (50000 UNIT) CAPS capsule; Take 1 capsule (50,000 Units total) by mouth every 7 (seven) days.  Dispense: 4 capsule; Refill: 0  3. At risk for malnutrition Sharon [name] was given extensive malnutrition prevention education and counseling today of more than 9 minutes.  She is at greater risk for malnutrition due to skipping meals and/or eating non-nutritious foods.  Counseled her that malnutrition refers to inappropriate nutrients or not the right balance of nutrients for optimal health.  Discussed with Sharon [name] that it is absolutely possible to be malnourished but yet obese.  Risk factors, including but not limited to, inappropriate dietary choices, difficulty with obtaining food due to physical or financial limitations, and various physical and mental  health conditions were reviewed with Sharon [name].   4. Obesity BMI today is 30  Course: Sharon [name] is currently in the action stage of change. As such, her goal is to continue with weight loss efforts.   Nutrition goals: She has agreed to keeping a food journal and adhering to recommended goals of 1600-1700 calories and 120 grams of protein.   Exercise  goals:  Work out 30 minutes or more 3 days per week plus getting in 10,000 steps per day.  Behavioral modification strategies: no skipping meals, meal planning and cooking strategies, and keeping healthy foods in the home.  Sharon [name] has agreed to follow-up with our clinic in 2-3 weeks. She was informed of the importance of frequent follow-up visits to maximize her success with intensive lifestyle modifications for her multiple health conditions.   Objective:   Blood pressure 95/67, pulse 74, temperature 98.3 F (36.8 C), height 5\' 9"  (1.753 m), weight 205 lb (93 kg), last menstrual period 03/27/2013, SpO2 97 %. Body mass index is 30.27 kg/m.  General: Cooperative, alert, well developed, in no acute distress. HEENT: Conjunctivae and lids unremarkable. Cardiovascular: Regular rhythm.  Lungs: Normal work of breathing. Neurologic: No focal deficits.   Lab Results  Component Value Date   CREATININE 0.88 02/16/2020   BUN 23 02/16/2020   NA 141 02/16/2020   K 5.3 (H) 02/16/2020   CL 101 02/16/2020   CO2 27 02/16/2020   Lab Results  Component Value Date   ALT 20 02/16/2020   AST 24 02/16/2020   ALKPHOS 73 02/16/2020   BILITOT 0.3 02/16/2020   Lab Results  Component Value Date   HGBA1C 5.9 (H) 02/16/2020   HGBA1C 6.1 (H) 10/30/2019   Lab Results  Component Value Date   INSULIN 10.9 02/16/2020   INSULIN 20.2 10/30/2019   Lab Results  Component Value Date   TSH 1.570 10/30/2019   Lab Results  Component Value Date   CHOL 215 (H) 02/16/2020   HDL 67 02/16/2020   LDLCALC 134 (H) 02/16/2020   TRIG 79 02/16/2020   CHOLHDL 3.2 02/16/2020   Lab Results  Component Value Date   VD25OH 46.1 02/16/2020   VD25OH 40.6 10/30/2019   Lab Results  Component Value Date   WBC 5.0 10/30/2019   HGB 12.9 10/30/2019   HCT 39.4 10/30/2019   MCV 86 10/30/2019   PLT 245 10/30/2019   Attestation Statements:   Reviewed by clinician on day of visit: allergies, medications, problem list,  medical history, surgical history, family history, social history, and previous encounter notes.  I, 12/30/2019, CMA, am acting as Insurance claims handler for Energy manager, DO.  I have reviewed the above documentation for accuracy and completeness, and I agree with the above. [employer], D.O.  The 21st Century Cures Act was signed into law in 2016 which includes the topic of electronic health records.  This provides immediate access to information in MyChart.  This includes consultation notes, operative notes, office notes, lab results and pathology reports.  If you have any questions about what you read please let 2017 know at your next visit so we can discuss your concerns and take corrective action if need be.  We are right here with you.

## 2020-07-27 ENCOUNTER — Ambulatory Visit
Admission: RE | Admit: 2020-07-27 | Discharge: 2020-07-27 | Disposition: A | Discharge status: Home / Self Care | Payer: BC Managed Care – PPO | Source: Ambulatory Visit | Attending: Orthopaedic Surgery | Admitting: Orthopaedic Surgery

## 2020-07-27 DIAGNOSIS — M25551 Pain in right hip: Secondary | ICD-10-CM

## 2020-07-27 DIAGNOSIS — M1611 Unilateral primary osteoarthritis, right hip: Secondary | ICD-10-CM | POA: Diagnosis not present

## 2020-07-27 MED ORDER — IOPAMIDOL (ISOVUE-M 200) INJECTION 41%
12.0000 mL | Freq: Once | INTRAMUSCULAR | Status: AC
  Administered 2020-07-27: 12 mL via INTRA_ARTICULAR

## 2020-08-02 ENCOUNTER — Ambulatory Visit (INDEPENDENT_AMBULATORY_CARE_PROVIDER_SITE_OTHER): Payer: BC Managed Care – PPO | Admitting: Orthopaedic Surgery

## 2020-08-02 ENCOUNTER — Encounter: Payer: Self-pay | Admitting: Orthopaedic Surgery

## 2020-08-02 DIAGNOSIS — M1611 Unilateral primary osteoarthritis, right hip: Secondary | ICD-10-CM

## 2020-08-02 DIAGNOSIS — M25551 Pain in right hip: Secondary | ICD-10-CM | POA: Diagnosis not present

## 2020-08-02 NOTE — Progress Notes (Signed)
The patient comes in today to go over a MRI of her right hip.  She is only 55 years old and has been having worsening right hip pain.  Her plain films were normal and ultrasound of the hip was suspicious for a labral tear.  We sent her for a MRI arthrogram to assess the labrum as well as the cartilage of her right hip.  It does hurt her with mobility and rotation.  The hip does move smoothly on the right side but it is definitely painful in the groin.  The MRI of the right hip did not show a labral tear but it does show moderate osteoarthritis with some areas of full-thickness fissuring.  There is also some subchondral edema in the femoral head and acetabulum consistent with quite significant arthritis.  I did give her handout about considering hip replacement surgery which she may need in the future if the pain becomes debilitating for her and definitely affects her mobility, her quality of life and actives daily living.  My next step would be having her consider an intra-articular injection of a steroid in the right hip under radiographic guidance.  All questions and concerns were answered and addressed.  She said she will think about all this and let us know which direction she would like to proceed in.  I told her to avoid high impact aerobic activities.  Weight loss and hip strengthening exercises as well as Tylenol arthritis alternating with Aleve could be helpful as well.

## 2020-08-04 ENCOUNTER — Other Ambulatory Visit: Payer: Self-pay

## 2020-08-04 ENCOUNTER — Ambulatory Visit (INDEPENDENT_AMBULATORY_CARE_PROVIDER_SITE_OTHER): Payer: BC Managed Care – PPO | Admitting: Family Medicine

## 2020-08-04 VITALS — BP 102/65 | HR 75 | Temp 98.0°F | Ht 69.0 in | Wt 204.0 lb

## 2020-08-04 DIAGNOSIS — Z6833 Body mass index (BMI) 33.0-33.9, adult: Secondary | ICD-10-CM

## 2020-08-04 DIAGNOSIS — G479 Sleep disorder, unspecified: Secondary | ICD-10-CM

## 2020-08-04 DIAGNOSIS — E669 Obesity, unspecified: Secondary | ICD-10-CM

## 2020-08-04 DIAGNOSIS — Z9189 Other specified personal risk factors, not elsewhere classified: Secondary | ICD-10-CM | POA: Diagnosis not present

## 2020-08-04 DIAGNOSIS — R7303 Prediabetes: Secondary | ICD-10-CM

## 2020-08-04 DIAGNOSIS — E559 Vitamin D deficiency, unspecified: Secondary | ICD-10-CM

## 2020-08-04 DIAGNOSIS — G4709 Other insomnia: Secondary | ICD-10-CM | POA: Diagnosis not present

## 2020-08-04 MED ORDER — VITAMIN D (ERGOCALCIFEROL) 1.25 MG (50000 UNIT) PO CAPS: 50000.0000 [IU] | ORAL_CAPSULE | ORAL | 0 refills | Status: DC

## 2020-08-04 MED ORDER — AMITRIPTYLINE HCL 25 MG PO TABS: 25.0000 mg | ORAL_TABLET | Freq: Every evening | ORAL | 0 refills | Status: DC | PRN

## 2020-08-04 MED ORDER — SEMAGLUTIDE (1 MG/DOSE) 4 MG/3ML ~~LOC~~ SOPN: 1.0000 mg | PEN_INJECTOR | SUBCUTANEOUS | 0 refills | Status: DC

## 2020-08-05 ENCOUNTER — Other Ambulatory Visit (INDEPENDENT_AMBULATORY_CARE_PROVIDER_SITE_OTHER): Payer: Self-pay | Admitting: Family Medicine

## 2020-08-05 DIAGNOSIS — K219 Gastro-esophageal reflux disease without esophagitis: Secondary | ICD-10-CM

## 2020-08-05 NOTE — Telephone Encounter (Signed)
Last OV with Dr Opalski 

## 2020-08-09 ENCOUNTER — Encounter (INDEPENDENT_AMBULATORY_CARE_PROVIDER_SITE_OTHER): Payer: Self-pay

## 2020-08-09 NOTE — Telephone Encounter (Signed)
Message sent to pt-[facility] 

## 2020-08-17 NOTE — Progress Notes (Signed)
Chief Complaint:   OBESITY Sharon [name] is here to discuss her progress with her obesity treatment plan along with follow-up of her obesity related diagnoses.   Today's visit was #: 14 Starting weight: 228 lbs Starting date: 10/30/2019 Today's weight: 204 lbs Today's date: 08/04/2020 Weight change since last visit: 1 lb Total lbs lost to date: 24 lbs Body mass index is 30.13 kg/m.  Total weight loss percentage to date: -10.53%  Interim History:  Sharon [name] started back to the gym regularly last week.  She is trying to get back into meal prepping/planning.  No issues or concerns with plan.  Current Meal Plan: keeping a food journal and adhering to recommended goals of 1600-1700 calories and 120 grams of protein for 50% of the time.  Current Exercise Plan: Cardio, playing tennis, weights for 30 minutes cardio, 30 minutes weights 4 times per week. Current Anti-Obesity Medications: Ozempic 0.5 mg subcutaneously weekly. Side effects: None.  Assessment/Plan:   Medications Discontinued During This Encounter  Medication Reason   Semaglutide,0.25 or 0.5MG /DOS, (OZEMPIC, 0.25 OR 0.5 MG/DOSE,) 2 MG/1.5ML SOPN Dose change   amitriptyline (ELAVIL) 25 MG tablet Reorder   Vitamin D, Ergocalciferol, (DRISDOL) 1.25 MG (50000 UNIT) CAPS capsule Reorder   Meds ordered this encounter  Medications   Vitamin D, Ergocalciferol, (DRISDOL) 1.25 MG (50000 UNIT) CAPS capsule    Sig: Take 1 capsule (50,000 Units total) by mouth every 7 (seven) days.    Dispense:  4 capsule    Refill:  0   amitriptyline (ELAVIL) 25 MG tablet    Sig: Take 1 tablet (25 mg total) by mouth at bedtime as needed for sleep.    Dispense:  30 tablet    Refill:  0   Semaglutide, 1 MG/DOSE, 4 MG/3ML SOPN    Sig: Inject 1 mg as directed once a week.    Dispense:  3 mL    Refill:  0    1. Prediabetes Not at goal. Goal is HgbA1c < 5.7.  Medication: Ozempic 0.5 mg subcutaneously weekly.  She has been on Ozempic 0.5 mg for 2 weeks now.  No  nausea/vomiting or side effects.  Does not feel much different on it.  Desires increase in dose.   Plan:  Increase Ozempic to 1 mg subcutaneously weekly.  She will continue to focus on protein-rich, low simple carbohydrate foods. We reviewed the importance of hydration, regular exercise for stress reduction, and restorative sleep.  Increase protein, decrease simple carbs.  Lab Results  Component Value Date   HGBA1C 5.9 (H) 02/16/2020   Lab Results  Component Value Date   INSULIN 10.9 02/16/2020   INSULIN 20.2 10/30/2019   - Increase and refill semaglutide, 1 MG/DOSE, 4 MG/3ML SOPN; Inject 1 mg as directed once a week.  Dispense: 3 mL; Refill: 0  2. Vitamin D deficiency Not optimized.  She is taking vitamin D 50,000 IU weekly.  Plan: Continue to take prescription Vitamin D @50 ,000 IU every week as prescribed.  Follow-up for routine testing of Vitamin D, at least 2-3 times per year to avoid over-replacement.  Lab Results  Component Value Date   VD25OH 46.1 02/16/2020   VD25OH 40.6 10/30/2019   - Refill Vitamin D, Ergocalciferol, (DRISDOL) 1.25 MG (50000 UNIT) CAPS capsule; Take 1 capsule (50,000 Units total) by mouth every 7 (seven) days.  Dispense: 4 capsule; Refill: 0  3. Other insomnia Hip pain is causing her not to sleep well.  Current treatment: Elavil 25 mg at bedtime  as needed.  Plan:  Refill Elavil.  Ice hip daily and at bedtime.  Follow-up with Sports Medicine for possible injection.  Recommend sleep hygiene measures including regular sleep schedule, optimal sleep environment, and relaxing presleep rituals.  We will continue to monitor this patient's condition along with her PCP.   - Refill amitriptyline (ELAVIL) 25 MG tablet; Take 1 tablet (25 mg total) by mouth at bedtime as needed for sleep.  Dispense: 30 tablet; Refill: 0  4. At risk for activity intolerance Sharon [name] was given approximately 9 minutes of counseling today regarding her increased risk for exercise intolerance.   We discussed patient's specific personal and medical issues that raise our concern.  She was advised of strategies to prevent injury and ways to improve her cardiopulmonary fitness levels slowly over time.  We additionally discussed various fitness trackers and smart phone apps to help motivate the patient to stay on track.   5. Class 1 obesity with serious comorbidity and body mass index (BMI) of 33.0 to 33.9 in adult, unspecified obesity type  Course: Sharon [name] is currently in the action stage of change. As such, her goal is to continue with weight loss efforts.   Nutrition goals: She has agreed to the Category 3 Plan and keeping a food journal and adhering to recommended goals of 1600-1700 calories and 120 grams of protein.   Exercise goals:  As is.  Behavioral modification strategies: increasing lean protein intake, decreasing simple carbohydrates, meal planning and cooking strategies, and avoiding temptations.  Sharon [name] has agreed to follow-up with our clinic in 2 weeks. She was informed of the importance of frequent follow-up visits to maximize her success with intensive lifestyle modifications for her multiple health conditions.   Objective:   Blood pressure 102/65, pulse 75, temperature 98 F (36.7 C), height 5\' 9"  (1.753 m), weight 204 lb (92.5 kg), last menstrual period 03/27/2013, SpO2 98 %. Body mass index is 30.13 kg/m.  General: Cooperative, alert, well developed, in no acute distress. HEENT: Conjunctivae and lids unremarkable. Cardiovascular: Regular rhythm.  Lungs: Normal work of breathing. Neurologic: No focal deficits.   Lab Results  Component Value Date   CREATININE 0.88 02/16/2020   BUN 23 02/16/2020   NA 141 02/16/2020   K 5.3 (H) 02/16/2020   CL 101 02/16/2020   CO2 27 02/16/2020   Lab Results  Component Value Date   ALT 20 02/16/2020   AST 24 02/16/2020   ALKPHOS 73 02/16/2020   BILITOT 0.3 02/16/2020   Lab Results  Component Value Date   HGBA1C 5.9 (H)  02/16/2020   HGBA1C 6.1 (H) 10/30/2019   Lab Results  Component Value Date   INSULIN 10.9 02/16/2020   INSULIN 20.2 10/30/2019   Lab Results  Component Value Date   TSH 1.570 10/30/2019   Lab Results  Component Value Date   CHOL 215 (H) 02/16/2020   HDL 67 02/16/2020   LDLCALC 134 (H) 02/16/2020   TRIG 79 02/16/2020   CHOLHDL 3.2 02/16/2020   Lab Results  Component Value Date   VD25OH 46.1 02/16/2020   VD25OH 40.6 10/30/2019   Lab Results  Component Value Date   WBC 5.0 10/30/2019   HGB 12.9 10/30/2019   HCT 39.4 10/30/2019   MCV 86 10/30/2019   PLT 245 10/30/2019   Attestation Statements:   Reviewed by clinician on day of visit: allergies, medications, problem list, medical history, surgical history, family history, social history, and previous encounter notes.  I, 12/30/2019, CMA, am acting  as transcriptionist for [employer], DO.  I have reviewed the above documentation for accuracy and completeness, and I agree with the above. Marjory Sneddon, D.O.  The [place] was signed into law in 2016 which includes the topic of electronic health records.  This provides immediate access to information in MyChart.  This includes consultation notes, operative notes, office notes, lab results and pathology reports.  If you have any questions about what you read please let us know at your next visit so we can discuss your concerns and take corrective action if need be.  We are right here with you.

## 2020-08-18 ENCOUNTER — Encounter (INDEPENDENT_AMBULATORY_CARE_PROVIDER_SITE_OTHER): Payer: Self-pay | Admitting: Family Medicine

## 2020-08-18 ENCOUNTER — Other Ambulatory Visit: Payer: Self-pay

## 2020-08-18 ENCOUNTER — Ambulatory Visit (INDEPENDENT_AMBULATORY_CARE_PROVIDER_SITE_OTHER): Payer: BC Managed Care – PPO | Admitting: Family Medicine

## 2020-08-18 VITALS — BP 129/73 | HR 70 | Temp 98.1°F | Ht 69.0 in | Wt 200.0 lb

## 2020-08-18 DIAGNOSIS — Z6833 Body mass index (BMI) 33.0-33.9, adult: Secondary | ICD-10-CM

## 2020-08-18 DIAGNOSIS — Z9189 Other specified personal risk factors, not elsewhere classified: Secondary | ICD-10-CM

## 2020-08-18 DIAGNOSIS — E7849 Other hyperlipidemia: Secondary | ICD-10-CM

## 2020-08-18 DIAGNOSIS — R7303 Prediabetes: Secondary | ICD-10-CM | POA: Diagnosis not present

## 2020-08-18 DIAGNOSIS — E669 Obesity, unspecified: Secondary | ICD-10-CM

## 2020-08-18 DIAGNOSIS — E559 Vitamin D deficiency, unspecified: Secondary | ICD-10-CM

## 2020-08-18 DIAGNOSIS — E876 Hypokalemia: Secondary | ICD-10-CM

## 2020-08-18 MED ORDER — VITAMIN D (ERGOCALCIFEROL) 1.25 MG (50000 UNIT) PO CAPS: 50000.0000 [IU] | ORAL_CAPSULE | ORAL | 0 refills | Status: DC

## 2020-08-18 MED ORDER — SEMAGLUTIDE (1 MG/DOSE) 4 MG/3ML ~~LOC~~ SOPN: 1.0000 mg | PEN_INJECTOR | SUBCUTANEOUS | 0 refills | Status: DC

## 2020-08-18 NOTE — Patient Instructions (Signed)
The 10-year ASCVD risk score Denman George DC Montez Hageman., et al., 2013) is: 2.6%   Values used to calculate the score:     Age: 55 years     Sex: Female     Is Non-Hispanic African American: Yes     Diabetic: No     Tobacco smoker: No     Systolic Blood Pressure: 129 mmHg     Is BP treated: No     HDL Cholesterol: 67 mg/dL     Total Cholesterol: 215 mg/dL

## 2020-08-25 NOTE — Progress Notes (Signed)
Chief Complaint:   OBESITY [name] is here to discuss her progress with her obesity treatment plan along with follow-up of her obesity related diagnoses. Sharon [name] is on the Category 3 Plan or keeping a food journal and adhering to recommended goals of 1600-1700 calories and 120 grams of protein daily and states she is following her eating plan approximately 80% of the time. [name] states she is doing weights, tennis, pilates, and walking for 45 minutes 5 times per week.  Today's visit was #: 15 Starting weight: 228 lbs Starting date: 10/30/2019 Today's weight: 200 lbs Today's date: 08/18/2020 Total lbs lost to date: 28 Total lbs lost since last in-office visit: 4  Interim History: [name] is doing well and she forgot to bring in her daily totals but per the patient. She is hitting protein goals most of the time, but low on calories many times. She has no issues with the plan, and she denies hunger.  Subjective:   1. Pre-diabetes Sharon [name] is tolerating her medications well with no side effects, and she notes it is helping with hunger.  2. Vitamin D deficiency Sharon [name] is currently taking prescription vitamin D 50,000 IU each week. She denies nausea, vomiting or muscle weakness.  3. Hypokalemia Sharon [name]'s last labs had an elevated K+. She is asymptomatic and has no new concerns.   4. Other hyperlipidemia Sharon [name]'s last LDL was elevated but high HDL as well. Her ASCVD risk score is <3%. She has no need for medications in the past.  5. At risk for malnutrition Sharon [name] is at risk for malnutrition due to not getting enough calories in most days.  Assessment/Plan:   Orders Placed This Encounter  Procedures   Hemoglobin A1c   Insulin, random   Comprehensive metabolic panel   VITAMIN D 25 Hydroxy (Vit-D Deficiency, Fractures)    Medications Discontinued During This Encounter  Medication Reason   Vitamin D, Ergocalciferol, (DRISDOL) 1.25 MG (50000 UNIT) CAPS capsule Reorder   Semaglutide, 1 MG/DOSE, 4  MG/3ML SOPN Reorder     Meds ordered this encounter  Medications   Semaglutide, 1 MG/DOSE, 4 MG/3ML SOPN    Sig: Inject 1 mg as directed once a week.    Dispense:  3 mL    Refill:  0    Ov for RF   Vitamin D, Ergocalciferol, (DRISDOL) 1.25 MG (50000 UNIT) CAPS capsule    Sig: Take 1 capsule (50,000 Units total) by mouth every 7 (seven) days.    Dispense:  4 capsule    Refill:  0    Ov for RF     1. Pre-diabetes [name] will continue to work on weight loss, exercise, and decreasing simple carbohydrates to help decrease the risk of diabetes. We will check labs today, and we will refill Ozempic for 1 month.  - Semaglutide, 1 MG/DOSE, 4 MG/3ML SOPN; Inject 1 mg as directed once a week.  Dispense: 3 mL; Refill: 0 - Hemoglobin A1c - Insulin, random - Comprehensive metabolic panel  2. Vitamin D deficiency Low Vitamin D level contributes to fatigue and are associated with obesity, breast, and colon cancer. We will check labs today, and we will refill prescription Vitamin D for 1 month. Sharon [name] will follow-up for routine testing of Vitamin D, at least 2-3 times per year to avoid over-replacement.  - Vitamin D, Ergocalciferol, (DRISDOL) 1.25 MG (50000 UNIT) CAPS capsule; Take 1 capsule (50,000 Units total) by mouth every 7 (seven) days.  Dispense: 4 capsule; Refill: 0 - VITAMIN D  25 Hydroxy (Vit-D Deficiency, Fractures)  3. Hypokalemia We will check labs, and [name] will continue her prudent nutritional plan.  - Comprehensive metabolic panel  4. Other hyperlipidemia Cardiovascular risk and specific lipid/LDL goals reviewed. We discussed several lifestyle modifications today. Sharon [name] will continue her prudent nutritional plan. She is to decrease salt and trans fat, and will continue to work on diet, exercise and weight loss efforts. Orders and follow up as documented in patient record.   Counseling Intensive lifestyle modifications are the first line treatment for this issue. Dietary changes:  Increase soluble fiber. Decrease simple carbohydrates. Exercise changes: Moderate to vigorous-intensity aerobic activity 150 minutes per week if tolerated. Lipid-lowering medications: see documented in medical record.  5. At risk for malnutrition Sharon [name] was given approximately 15 minutes of counseling today regarding prevention of malnutrition and ways to meet macronutrient goals..   6. Obesity with current BMI of 29.7 Sharon [name] is currently in the action stage of change. As such, her goal is to continue with weight loss efforts. She has agreed to the Category 3 Plan or keeping a food journal and adhering to recommended goals of 1600-1700 calories and 120 grams of protein daily.   Exercise goals: As is.  Behavioral modification strategies: increasing lean protein intake, decreasing simple carbohydrates, and increasing water intake.  Sharon [name] has agreed to follow-up with our clinic in 2 to 3 weeks. She was informed of the importance of frequent follow-up visits to maximize her success with intensive lifestyle modifications for her multiple health conditions.   Objective:   Blood pressure 129/73, pulse 70, temperature 98.1 F (36.7 C), height 5\' 9"  (1.753 m), weight 200 lb (90.7 kg), last menstrual period 03/27/2013, SpO2 94 %. Body mass index is 29.53 kg/m.  General: Cooperative, alert, well developed, in no acute distress. HEENT: Conjunctivae and lids unremarkable. Cardiovascular: Regular rhythm.  Lungs: Normal work of breathing. Neurologic: No focal deficits.   Lab Results  Component Value Date   CREATININE 0.88 02/16/2020   BUN 23 02/16/2020   NA 141 02/16/2020   K 5.3 (H) 02/16/2020   CL 101 02/16/2020   CO2 27 02/16/2020   Lab Results  Component Value Date   ALT 20 02/16/2020   AST 24 02/16/2020   ALKPHOS 73 02/16/2020   BILITOT 0.3 02/16/2020   Lab Results  Component Value Date   HGBA1C 5.9 (H) 02/16/2020   HGBA1C 6.1 (H) 10/30/2019   Lab Results  Component Value Date    INSULIN 10.9 02/16/2020   INSULIN 20.2 10/30/2019   Lab Results  Component Value Date   TSH 1.570 10/30/2019   Lab Results  Component Value Date   CHOL 215 (H) 02/16/2020   HDL 67 02/16/2020   LDLCALC 134 (H) 02/16/2020   TRIG 79 02/16/2020   CHOLHDL 3.2 02/16/2020   Lab Results  Component Value Date   VD25OH 46.1 02/16/2020   VD25OH 40.6 10/30/2019   Lab Results  Component Value Date   WBC 5.0 10/30/2019   HGB 12.9 10/30/2019   HCT 39.4 10/30/2019   MCV 86 10/30/2019   PLT 245 10/30/2019   No results found for: IRON, TIBC, FERRITIN  Attestation Statements:   Reviewed by clinician on day of visit: allergies, medications, problem list, medical history, surgical history, family history, social history, and previous encounter notes.   12/30/2019, am acting as transcriptionist for Trude Mcburney, DO.  I have reviewed the above documentation for accuracy and completeness, and I agree with the above. -  Marjory Sneddon, D.O.  The [place] was signed into law in 2016 which includes the topic of electronic health records.  This provides immediate access to information in MyChart.  This includes consultation notes, operative notes, office notes, lab results and pathology reports.  If you have any questions about what you read please let us know at your next visit so we can discuss your concerns and take corrective action if need be.  We are right here with you.

## 2020-08-30 ENCOUNTER — Other Ambulatory Visit (INDEPENDENT_AMBULATORY_CARE_PROVIDER_SITE_OTHER): Payer: Self-pay | Admitting: Family Medicine

## 2020-08-30 DIAGNOSIS — G4709 Other insomnia: Secondary | ICD-10-CM

## 2020-08-30 NOTE — Telephone Encounter (Signed)
Dr.Opalski ?

## 2020-09-02 ENCOUNTER — Ambulatory Visit (INDEPENDENT_AMBULATORY_CARE_PROVIDER_SITE_OTHER): Payer: BC Managed Care – PPO | Admitting: Family Medicine

## 2020-09-02 ENCOUNTER — Other Ambulatory Visit: Payer: Self-pay

## 2020-09-02 ENCOUNTER — Ambulatory Visit (INDEPENDENT_AMBULATORY_CARE_PROVIDER_SITE_OTHER): Payer: BC Managed Care – PPO | Admitting: Sports Medicine

## 2020-09-02 ENCOUNTER — Encounter (INDEPENDENT_AMBULATORY_CARE_PROVIDER_SITE_OTHER): Payer: Self-pay | Admitting: Family Medicine

## 2020-09-02 VITALS — BP 100/64 | HR 60 | Temp 98.1°F | Ht 69.0 in | Wt 199.0 lb

## 2020-09-02 VITALS — BP 108/68 | Ht 69.0 in | Wt 199.0 lb

## 2020-09-02 DIAGNOSIS — R7303 Prediabetes: Secondary | ICD-10-CM | POA: Diagnosis not present

## 2020-09-02 DIAGNOSIS — M25551 Pain in right hip: Secondary | ICD-10-CM

## 2020-09-02 DIAGNOSIS — Z6836 Body mass index (BMI) 36.0-36.9, adult: Secondary | ICD-10-CM | POA: Diagnosis not present

## 2020-09-02 DIAGNOSIS — E559 Vitamin D deficiency, unspecified: Secondary | ICD-10-CM | POA: Diagnosis not present

## 2020-09-02 DIAGNOSIS — E876 Hypokalemia: Secondary | ICD-10-CM | POA: Diagnosis not present

## 2020-09-02 NOTE — Progress Notes (Signed)
   Sharon [name] is a 55 y.o. female who presents to [facility] today for the following:  Right hip pain Last seen for this on 05/06/2020 Prior x-rays have not shown significant hip osteoarthritis She did have a course of steroids in March 2022 for possible hip flexor tendinopathy versus labral tear, but unclear if she had improvement with this given she was resting on vacation while she took the steroids She had an MRI right hip performed on 07/27/2020 that shows moderate right hip osteoarthritis with areas of full-thickness chondral fissuring involving posterior femoral head and acetabulum and reactive subchondral marrow edema She was seen by Dr. Magnus Ivan for this on 7/11, did discuss hip replacement versus trialing an intra-articular injection She continues to have right hip pain over the last 9 months, the pain radiates to her groin She reports that it is most painful when it catches as she is going from sitting to standing Moving around is okay No N/T Has some pain in center of tailbone with sitting, otherwise no posterior or back pain She does Pilates for exercise and states that some movements like this he said caused pain in her right hip He has not tried any prior injections She does use Aleve that gives her some improvement in pain Able to play tennis without difficulty    PMH this reviewed.  ROS as above. Medications reviewed.  Exam:  BP 108/68   Ht 5\' 9"  (1.753 m)   Wt 199 lb (90.3 kg)   LMP 03/27/2013   BMI 29.39 kg/m  Gen: Well NAD MSK:  Right Hip:  - Inspection: No gross deformity, no swelling, erythema, or ecchymosis b/l - Palpation: No TTP, specifically none over greater trochanter b/l - ROM: Normal range of motion on Flexion, extension, abduction, b/l, with slightly decreased internal and external rotation on right while seated - Strength: Normal strength in all fields b/l aside from hip abduction 4/5 on right - Neuro/vasc: NV intact distally b/l - Special  Tests: Positive FADIR on right, negative FABER, Negative Trendelenberg.  Negative Ober's.  - Gait: Slightly antalgic gait with first few steps after standing, after she is walking for quite some time she still has decrease in her hip extension on the right  No results found.   Assessment and Plan: 1) Right hip pain Discussed with patient that given she is still functional and has not had any difficulty playing tennis or walking due to her hip pain, would not recommend hip injection at this time.  Advised saving hip injection for the future if she becomes unable to perform activities that she would like, unable to play tennis, or has difficulty sleeping from pain.  She was very reassured by this.  She will follow-up as needed.   05/27/2013, D.O.  PGY-4 [facility]  09/02/2020 4:55 PM I observed and examined the patient with the [facility] resident and agree with assessment and plan.  Note reviewed and modified by me.  [facility], MD

## 2020-09-02 NOTE — Assessment & Plan Note (Signed)
Discussed with patient that given she is still functional and has not had any difficulty playing tennis or walking due to her hip pain, would not recommend hip injection at this time.  Advised saving hip injection for the future if she becomes unable to perform activities that she would like, unable to play tennis, or has difficulty sleeping from pain.  She was very reassured by this.  She will follow-up as needed.

## 2020-09-03 LAB — COMPREHENSIVE METABOLIC PANEL
ALT: 16 IU/L (ref 0–32)
AST: 24 IU/L (ref 0–40)
Albumin/Globulin Ratio: 1.7 (ref 1.2–2.2)
Albumin: 4.4 g/dL (ref 3.8–4.9)
Alkaline Phosphatase: 71 IU/L (ref 44–121)
BUN/Creatinine Ratio: 24 — ABNORMAL HIGH (ref 9–23)
BUN: 20 mg/dL (ref 6–24)
Bilirubin Total: 0.3 mg/dL (ref 0.0–1.2)
CO2: 27 mmol/L (ref 20–29)
Calcium: 9.6 mg/dL (ref 8.7–10.2)
Chloride: 101 mmol/L (ref 96–106)
Creatinine, Ser: 0.82 mg/dL (ref 0.57–1.00)
Globulin, Total: 2.6 g/dL (ref 1.5–4.5)
Glucose: 88 mg/dL (ref 65–99)
Potassium: 4.7 mmol/L (ref 3.5–5.2)
Sodium: 140 mmol/L (ref 134–144)
Total Protein: 7 g/dL (ref 6.0–8.5)
eGFR: 85 mL/min/{1.73_m2} (ref >59)

## 2020-09-03 LAB — HEMOGLOBIN A1C
Est. average glucose Bld gHb Est-mCnc: 117 mg/dL
Hgb A1c MFr Bld: 5.7 % — ABNORMAL HIGH (ref 4.8–5.6)

## 2020-09-03 LAB — VITAMIN D 25 HYDROXY (VIT D DEFICIENCY, FRACTURES): Vit D, 25-Hydroxy: 57.7 ng/mL (ref 30.0–100.0)

## 2020-09-03 LAB — INSULIN, RANDOM: INSULIN: 14.4 u[IU]/mL (ref 2.6–24.9)

## 2020-09-06 NOTE — Progress Notes (Signed)
Chief Complaint:   OBESITY Sharon [name] is here to discuss her progress with her obesity treatment plan along with follow-up of her obesity related diagnoses. Sharon [name] is on the Category 3 Plan or keeping a food journal and adhering to recommended goals of 1600-1700 calories and 120 grams of protein daily and states she is following her eating plan approximately 75% of the time. Sharon [name] states she is walking, lifting weights, playing Tennis, and palates for 90 minutes 5 times per week.  Today's visit was #: 16 Starting weight: 228 lbs Starting date: 10/30/2019 Today's weight: 199 lbs Today's date: 09/02/2020 Total lbs lost to date: 29 Total lbs lost since last in-office visit: 1  Interim History: Sharon [name] increased her exercise to 90 minutes per day or more. She is not eating as well as she could be doing. She denies issues with the plan.  Subjective:   1. Pre-diabetes Sharon [name] notes decreased desire to eat at all, but she does not want to decrease her Ozempic dose because she is no longer snacking, etc.  Assessment/Plan:  No orders of the defined types were placed in this encounter.   There are no discontinued medications.   No orders of the defined types were placed in this encounter.    1. Pre-diabetes Sharon [name] will continue Ozempic, and will continue to work on weight loss, exercise, and decreasing simple carbohydrates to help decrease the risk of diabetes.   2. Obesity with current BMI 29.4 Sharon [name] is currently in the action stage of change. As such, her goal is to continue with weight loss efforts. She has agreed to the Category 3 Plan or keeping a food journal and adhering to recommended goals of 1600-1700 calories and 120 grams of protein daily.   Exercise goals: As is.  Behavioral modification strategies: meal planning and cooking strategies, keeping healthy foods in the home, avoiding temptations, and planning for success.  Sharon [name] has agreed to follow-up with our clinic in 2 weeks. She was  informed of the importance of frequent follow-up visits to maximize her success with intensive lifestyle modifications for her multiple health conditions.   Objective:   Blood pressure 100/64, pulse 60, temperature 98.1 F (36.7 C), height 5\' 9"  (1.753 m), weight 199 lb (90.3 kg), last menstrual period 03/27/2013, SpO2 97 %. Body mass index is 29.39 kg/m.  General: Cooperative, alert, well developed, in no acute distress. HEENT: Conjunctivae and lids unremarkable. Cardiovascular: Regular rhythm.  Lungs: Normal work of breathing. Neurologic: No focal deficits.   Lab Results  Component Value Date   CREATININE 0.82 09/02/2020   BUN 20 09/02/2020   NA 140 09/02/2020   K 4.7 09/02/2020   CL 101 09/02/2020   CO2 27 09/02/2020   Lab Results  Component Value Date   ALT 16 09/02/2020   AST 24 09/02/2020   ALKPHOS 71 09/02/2020   BILITOT 0.3 09/02/2020   Lab Results  Component Value Date   HGBA1C 5.7 (H) 09/02/2020   HGBA1C 5.9 (H) 02/16/2020   HGBA1C 6.1 (H) 10/30/2019   Lab Results  Component Value Date   INSULIN 14.4 09/02/2020   INSULIN 10.9 02/16/2020   INSULIN 20.2 10/30/2019   Lab Results  Component Value Date   TSH 1.570 10/30/2019   Lab Results  Component Value Date   CHOL 215 (H) 02/16/2020   HDL 67 02/16/2020   LDLCALC 134 (H) 02/16/2020   TRIG 79 02/16/2020   CHOLHDL 3.2 02/16/2020   Lab Results  Component Value Date  VD25OH 57.7 09/02/2020   VD25OH 46.1 02/16/2020   VD25OH 40.6 10/30/2019   Lab Results  Component Value Date   WBC 5.0 10/30/2019   HGB 12.9 10/30/2019   HCT 39.4 10/30/2019   MCV 86 10/30/2019   PLT 245 10/30/2019   No results found for: IRON, TIBC, FERRITIN  Attestation Statements:   Reviewed by clinician on day of visit: allergies, medications, problem list, medical history, surgical history, family history, social history, and previous encounter notes.  Time spent on visit including pre-visit chart review and post-visit  care and charting was 20 minutes.    Trude Mcburney, am acting as transcriptionist for [employer], DO.  I have reviewed the above documentation for accuracy and completeness, and I agree with the above. [name], D.O.  The 21st Century Cures Act was signed into law in 2016 which includes the topic of electronic health records.  This provides immediate access to information in MyChart.  This includes consultation notes, operative notes, office notes, lab results and pathology reports.  If you have any questions about what you read please let us know at your next visit so we can discuss your concerns and take corrective action if need be.  We are right here with you.

## 2020-09-16 ENCOUNTER — Other Ambulatory Visit: Payer: Self-pay

## 2020-09-16 ENCOUNTER — Ambulatory Visit (INDEPENDENT_AMBULATORY_CARE_PROVIDER_SITE_OTHER): Payer: BC Managed Care – PPO | Admitting: Family Medicine

## 2020-09-16 ENCOUNTER — Encounter (INDEPENDENT_AMBULATORY_CARE_PROVIDER_SITE_OTHER): Payer: Self-pay | Admitting: Family Medicine

## 2020-09-16 VITALS — BP 96/59 | HR 64 | Temp 97.7°F | Ht 69.0 in | Wt 198.0 lb

## 2020-09-16 DIAGNOSIS — Z6833 Body mass index (BMI) 33.0-33.9, adult: Secondary | ICD-10-CM

## 2020-09-16 DIAGNOSIS — Z9189 Other specified personal risk factors, not elsewhere classified: Secondary | ICD-10-CM

## 2020-09-16 DIAGNOSIS — E559 Vitamin D deficiency, unspecified: Secondary | ICD-10-CM | POA: Diagnosis not present

## 2020-09-16 DIAGNOSIS — R7303 Prediabetes: Secondary | ICD-10-CM | POA: Diagnosis not present

## 2020-09-16 DIAGNOSIS — E669 Obesity, unspecified: Secondary | ICD-10-CM

## 2020-09-16 MED ORDER — VITAMIN D (ERGOCALCIFEROL) 1.25 MG (50000 UNIT) PO CAPS: 50000.0000 [IU] | ORAL_CAPSULE | ORAL | 0 refills | Status: DC

## 2020-09-16 MED ORDER — SEMAGLUTIDE (1 MG/DOSE) 4 MG/3ML ~~LOC~~ SOPN: 1.0000 mg | PEN_INJECTOR | SUBCUTANEOUS | 0 refills | Status: DC

## 2020-09-20 NOTE — Progress Notes (Signed)
Chief Complaint:   OBESITY Arieana is here to discuss her progress with her obesity treatment plan along with follow-up of her obesity related diagnoses. Elveta is on the Category 3 Plan or keeping a food journal and adhering to recommended goals of 1600-1700 calories and 120 grams of protein daily and states she is following her eating plan approximately 50% of the time. Renn states she is doing pilates, walking, and playing tennis for 90 minutes 4-5 times per week.  Today's visit was #: 17 Starting weight: 228 lbs Starting date: 10/30/2019 Today's weight: 198 lbs Today's date: 09/16/2020 Total lbs lost to date: 30 Total lbs lost since last in-office visit: 1  Interim History: Chassie is leaving to go to [place] and [place] to [place] her kids back at school. She hasn't been focusing on herself, and not getting her protein like she knows she needs to.  Subjective:   1. Pre-diabetes Letti has a diagnosis of prediabetes based on her elevated HgA1c and was informed this puts her at greater risk of developing diabetes. She continues to work on diet and exercise to decrease her risk of diabetes. She denies nausea or hypoglycemia.  2. Vitamin D deficiency Starling Jessie is tolerating medication(s) well without side effects.  Medication compliance is good and patient appears to be taking it as prescribed.  Denies additional concerns regarding this condition.   3. At risk for deficient intake of food Rieley is at a higher than average risk of deficient intake of food due to inadequate intake.  Assessment/Plan:  No orders of the defined types were placed in this encounter.   Medications Discontinued During This Encounter  Medication Reason   Semaglutide, 1 MG/DOSE, 4 MG/3ML SOPN Reorder   Vitamin D, Ergocalciferol, (DRISDOL) 1.25 MG (50000 UNIT) CAPS capsule Reorder     Meds ordered this encounter  Medications   Semaglutide, 1 MG/DOSE, 4 MG/3ML SOPN    Sig: Inject 1 mg as directed once a  week.    Dispense:  3 mL    Refill:  0    Ov for RF   Vitamin D, Ergocalciferol, (DRISDOL) 1.25 MG (50000 UNIT) CAPS capsule    Sig: Take 1 capsule (50,000 Units total) by mouth every 7 (seven) days.    Dispense:  4 capsule    Refill:  0    Ov for RF     1. Pre-diabetes Akasha will continue her medications, and we will refill Ozempic for 1 month.  - I reiterated and again counseled patient on pathophysiology of the disease process of Pre-DM.   - Stressed importance of dietary and lifestyle modifications resulting in weight loss as first line txmnt - in addition we discussed the risks and benefits of various medication options which can help [place] in the management of this disease process as well as with weight loss.  Will consider starting one of these meds in future as will focus on prudent nutritional plan at this time.  - continue to decrease simple carbs; increase fiber and proteins -> follow meal plan  - handouts provided at pt's request after education provided.  All concerns/questions addressed.   - anticipatory guidance given.   - Recheck A1c and fasting insulin level in approximately 3 months from last check or as deemed fit.   - Semaglutide, 1 MG/DOSE, 4 MG/3ML SOPN; Inject 1 mg as directed once a week.  Dispense: 3 mL; Refill: 0  2. Vitamin D deficiency Marinell will continue prescription  Vit D, and we will refill for 1 month.  - Discussed importance of vitamin D to their health and well-being.  - possible symptoms of low Vitamin D can be low energy, depressed mood, muscle aches, joint aches, osteoporosis etc. - low Vitamin D levels may be linked to an increased risk of cardiovascular events and even increased risk of cancers- such as colon and breast.  - I recommend pt take a 50,000 IU weekly prescription vit D - see script below   - Informed patient this may be a lifelong thing, and she was encouraged to continue to take the medicine until told otherwise.   - we will need to  monitor levels regularly (every 3-4 mo on average) to keep levels within normal limits.  - weight loss will likely improve availability of vitamin D, thus encouraged Eunie to continue with meal plan and their weight loss efforts to further improve this condition - pt's questions and concerns regarding this condition addressed.   - Vitamin D, Ergocalciferol, (DRISDOL) 1.25 MG (50000 UNIT) CAPS capsule; Take 1 capsule (50,000 Units total) by mouth every 7 (seven) days.  Dispense: 4 capsule; Refill: 0  3. At risk for deficient intake of food Calen was given extensive education and counseling today of more than 9 minutes on risks associated with deficient food intake.  Counseled her on the importance of following our prescribed meal plan and eating adequate amounts of protein.  Discussed with Tama Gander that inadequate food intake over longer periods of time can slow their metabolism down significantly.   4. Obesity with current BMI of 29.3 Chinwe is currently in the action stage of change. As such, her goal is to continue with weight loss efforts. She has agreed to the Category 3 Plan or keeping a food journal and adhering to recommended goals of 1600-1700 calories and 120 grams of protein daily.   Lalita was given a log to document her total grams of protein and calories each day.  Exercise goals: As is.  Behavioral modification strategies: increasing lean protein intake and keeping a strict food journal.  Maghen has agreed to follow-up with our clinic in 4 weeks. She was informed of the importance of frequent follow-up visits to maximize her success with intensive lifestyle modifications for her multiple health conditions.   Objective:   Blood pressure (!) 96/59, pulse 64, temperature 97.7 F (36.5 C), height 5\' 9"  (1.753 m), weight 198 lb (89.8 kg), last menstrual period 03/27/2013, SpO2 96 %. Body mass index is 29.24 kg/m.  General: Cooperative, alert, well developed, in no acute  distress. HEENT: Conjunctivae and lids unremarkable. Cardiovascular: Regular rhythm.  Lungs: Normal work of breathing. Neurologic: No focal deficits.   Lab Results  Component Value Date   CREATININE 0.82 09/02/2020   BUN 20 09/02/2020   NA 140 09/02/2020   K 4.7 09/02/2020   CL 101 09/02/2020   CO2 27 09/02/2020   Lab Results  Component Value Date   ALT 16 09/02/2020   AST 24 09/02/2020   ALKPHOS 71 09/02/2020   BILITOT 0.3 09/02/2020   Lab Results  Component Value Date   HGBA1C 5.7 (H) 09/02/2020   HGBA1C 5.9 (H) 02/16/2020   HGBA1C 6.1 (H) 10/30/2019   Lab Results  Component Value Date   INSULIN 14.4 09/02/2020   INSULIN 10.9 02/16/2020   INSULIN 20.2 10/30/2019   Lab Results  Component Value Date   TSH 1.570 10/30/2019   Lab Results  Component Value Date  CHOL 215 (H) 02/16/2020   HDL 67 02/16/2020   LDLCALC 134 (H) 02/16/2020   TRIG 79 02/16/2020   CHOLHDL 3.2 02/16/2020   Lab Results  Component Value Date   VD25OH 57.7 09/02/2020   VD25OH 46.1 02/16/2020   VD25OH 40.6 10/30/2019   Lab Results  Component Value Date   WBC 5.0 10/30/2019   HGB 12.9 10/30/2019   HCT 39.4 10/30/2019   MCV 86 10/30/2019   PLT 245 10/30/2019   No results found for: IRON, TIBC, FERRITIN  Attestation Statements:   Reviewed by clinician on day of visit: allergies, medications, problem list, medical history, surgical history, family history, social history, and previous encounter notes.   Trude Mcburney, am acting as transcriptionist for [employer], DO.  I have reviewed the above documentation for accuracy and completeness, and I agree with the above. [name], D.O.  The 21st Century Cures Act was signed into law in 2016 which includes the topic of electronic health records.  This provides immediate access to information in MyChart.  This includes consultation notes, operative notes, office notes, lab results and pathology reports.  If you have any  questions about what you read please let us know at your next visit so we can discuss your concerns and take corrective action if need be.  We are right here with you.

## 2020-09-24 ENCOUNTER — Other Ambulatory Visit (INDEPENDENT_AMBULATORY_CARE_PROVIDER_SITE_OTHER): Payer: Self-pay | Admitting: Family Medicine

## 2020-09-24 DIAGNOSIS — E559 Vitamin D deficiency, unspecified: Secondary | ICD-10-CM

## 2020-09-28 NOTE — Telephone Encounter (Signed)
Dr.Opalski ?

## 2020-09-29 ENCOUNTER — Other Ambulatory Visit: Payer: Self-pay | Admitting: Sports Medicine

## 2020-10-07 ENCOUNTER — Other Ambulatory Visit: Payer: Self-pay

## 2020-10-07 ENCOUNTER — Ambulatory Visit (INDEPENDENT_AMBULATORY_CARE_PROVIDER_SITE_OTHER): Payer: BC Managed Care – PPO | Admitting: Family Medicine

## 2020-10-07 ENCOUNTER — Encounter (INDEPENDENT_AMBULATORY_CARE_PROVIDER_SITE_OTHER): Payer: Self-pay | Admitting: Family Medicine

## 2020-10-07 VITALS — BP 95/58 | HR 68 | Temp 97.3°F | Ht 69.0 in | Wt 197.0 lb

## 2020-10-07 DIAGNOSIS — Z6833 Body mass index (BMI) 33.0-33.9, adult: Secondary | ICD-10-CM | POA: Diagnosis not present

## 2020-10-07 DIAGNOSIS — E669 Obesity, unspecified: Secondary | ICD-10-CM | POA: Diagnosis not present

## 2020-10-07 DIAGNOSIS — Z9189 Other specified personal risk factors, not elsewhere classified: Secondary | ICD-10-CM | POA: Diagnosis not present

## 2020-10-07 DIAGNOSIS — E559 Vitamin D deficiency, unspecified: Secondary | ICD-10-CM | POA: Diagnosis not present

## 2020-10-07 MED ORDER — VITAMIN D (ERGOCALCIFEROL) 1.25 MG (50000 UNIT) PO CAPS: 50000.0000 [IU] | ORAL_CAPSULE | ORAL | 0 refills | Status: DC

## 2020-10-11 NOTE — Progress Notes (Signed)
Chief Complaint:   OBESITY Sharon [name] is here to discuss her progress with her obesity treatment plan along with follow-up of her obesity related diagnoses. Sharon [name] is on the Category 3 Plan and keeping a food journal and adhering to recommended goals of 1600-1700 calories and 120 grams protein and states she is following her eating plan approximately 50% of the time. Sharon [name] states she is walking, doing pilates, and playing tennis 30 minutes 3 times per week.  Today's visit was #: 18 Starting weight: 228 lbs Starting date: 10/30/2019 Today's weight: 197 lbs Today's date: 10/07/2020 Total lbs lost to date: 31 Total lbs lost since last in-office visit: 1  Interim History: Sharon [name] recently had COVID and was sick for a week. She was unable to eat properly, skipped foods, and did not exercise.  Subjective:   1. Vitamin D deficiency She is currently taking prescription vitamin D 50,000 IU each week. She denies nausea, vomiting or muscle weakness.  Lab Results  Component Value Date   VD25OH 57.7 09/02/2020   VD25OH 46.1 02/16/2020   VD25OH 40.6 10/30/2019   2. At risk for constipation Sharon [name] is at increased risk for constipation due to Ozempic.  Assessment/Plan:  No orders of the defined types were placed in this encounter.   Medications Discontinued During This Encounter  Medication Reason   Vitamin D, Ergocalciferol, (DRISDOL) 1.25 MG (50000 UNIT) CAPS capsule Reorder     Meds ordered this encounter  Medications   Vitamin D, Ergocalciferol, (DRISDOL) 1.25 MG (50000 UNIT) CAPS capsule    Sig: Take 1 capsule (50,000 Units total) by mouth every 7 (seven) days.    Dispense:  4 capsule    Refill:  0    Ov for RF     1. Vitamin D deficiency Low Vitamin D level contributes to fatigue and are associated with obesity, breast, and colon cancer. She agrees to continue to take prescription Vitamin D 50,000 IU every week and will follow-up for routine testing of Vitamin D, at least 2-3 times per  year to avoid over-replacement.  Refill- Vitamin D, Ergocalciferol, (DRISDOL) 1.25 MG (50000 UNIT) CAPS capsule; Take 1 capsule (50,000 Units total) by mouth every 7 (seven) days.  Dispense: 4 capsule; Refill: 0  2. At risk for constipation Oneta was given approximately 15 minutes of counseling today regarding prevention of constipation. She was encouraged to increase water and fiber intake.    3. Obesity with current BMI of 29.1  Sharon [name] is currently in the action stage of change. As such, her goal is to continue with weight loss efforts. She has agreed to the Category 3 Plan and keeping a food journal and adhering to recommended goals of 1600-1700 calories and 120+ grams protein.   Exercise goals:  Increase as tolerated.  Behavioral modification strategies: celebration eating strategies.  Sharon [name] has agreed to follow-up with our clinic in 2-3 weeks. She was informed of the importance of frequent follow-up visits to maximize her success with intensive lifestyle modifications for her multiple health conditions.   Objective:   Blood pressure (!) 95/58, pulse 68, temperature (!) 97.3 F (36.3 C), height 5\' 9"  (1.753 m), weight 197 lb (89.4 kg), last menstrual period 03/27/2013, SpO2 97 %. Body mass index is 29.09 kg/m.  General: Cooperative, alert, well developed, in no acute distress. HEENT: Conjunctivae and lids unremarkable. Cardiovascular: Regular rhythm.  Lungs: Normal work of breathing. Neurologic: No focal deficits.   Lab Results  Component Value Date   CREATININE 0.82 09/02/2020  BUN 20 09/02/2020   NA 140 09/02/2020   K 4.7 09/02/2020   CL 101 09/02/2020   CO2 27 09/02/2020   Lab Results  Component Value Date   ALT 16 09/02/2020   AST 24 09/02/2020   ALKPHOS 71 09/02/2020   BILITOT 0.3 09/02/2020   Lab Results  Component Value Date   HGBA1C 5.7 (H) 09/02/2020   HGBA1C 5.9 (H) 02/16/2020   HGBA1C 6.1 (H) 10/30/2019   Lab Results  Component Value Date   INSULIN  14.4 09/02/2020   INSULIN 10.9 02/16/2020   INSULIN 20.2 10/30/2019   Lab Results  Component Value Date   TSH 1.570 10/30/2019   Lab Results  Component Value Date   CHOL 215 (H) 02/16/2020   HDL 67 02/16/2020   LDLCALC 134 (H) 02/16/2020   TRIG 79 02/16/2020   CHOLHDL 3.2 02/16/2020   Lab Results  Component Value Date   VD25OH 57.7 09/02/2020   VD25OH 46.1 02/16/2020   VD25OH 40.6 10/30/2019   Lab Results  Component Value Date   WBC 5.0 10/30/2019   HGB 12.9 10/30/2019   HCT 39.4 10/30/2019   MCV 86 10/30/2019   PLT 245 10/30/2019    Attestation Statements:   Reviewed by clinician on day of visit: allergies, medications, problem list, medical history, surgical history, family history, social history, and previous encounter notes.  Edmund Hilda, CMA, am acting as transcriptionist for [employer], DO.  I have reviewed the above documentation for accuracy and completeness, and I agree with the above. [name], D.O.  The 21st Century Cures Act was signed into law in 2016 which includes the topic of electronic health records.  This provides immediate access to information in MyChart.  This includes consultation notes, operative notes, office notes, lab results and pathology reports.  If you have any questions about what you read please let us know at your next visit so we can discuss your concerns and take corrective action if need be.  We are right here with you.

## 2020-10-26 ENCOUNTER — Other Ambulatory Visit: Payer: Self-pay

## 2020-10-26 ENCOUNTER — Ambulatory Visit (INDEPENDENT_AMBULATORY_CARE_PROVIDER_SITE_OTHER): Payer: BC Managed Care – PPO | Admitting: Family Medicine

## 2020-10-26 ENCOUNTER — Encounter (INDEPENDENT_AMBULATORY_CARE_PROVIDER_SITE_OTHER): Payer: Self-pay

## 2020-10-26 ENCOUNTER — Encounter (INDEPENDENT_AMBULATORY_CARE_PROVIDER_SITE_OTHER): Payer: Self-pay | Admitting: Family Medicine

## 2020-10-26 VITALS — BP 107/73 | HR 62 | Temp 97.9°F | Ht 69.0 in | Wt 197.0 lb

## 2020-10-26 DIAGNOSIS — R7303 Prediabetes: Secondary | ICD-10-CM

## 2020-10-26 DIAGNOSIS — Z9189 Other specified personal risk factors, not elsewhere classified: Secondary | ICD-10-CM

## 2020-10-26 DIAGNOSIS — Z6833 Body mass index (BMI) 33.0-33.9, adult: Secondary | ICD-10-CM

## 2020-10-26 DIAGNOSIS — E669 Obesity, unspecified: Secondary | ICD-10-CM

## 2020-10-26 DIAGNOSIS — E559 Vitamin D deficiency, unspecified: Secondary | ICD-10-CM | POA: Diagnosis not present

## 2020-10-26 MED ORDER — SEMAGLUTIDE (2 MG/DOSE) 8 MG/3ML ~~LOC~~ SOPN: 2.0000 mg | PEN_INJECTOR | SUBCUTANEOUS | 0 refills | Status: DC

## 2020-10-26 MED ORDER — VITAMIN D (ERGOCALCIFEROL) 1.25 MG (50000 UNIT) PO CAPS: 50000.0000 [IU] | ORAL_CAPSULE | ORAL | 0 refills | Status: DC

## 2020-10-27 ENCOUNTER — Other Ambulatory Visit (INDEPENDENT_AMBULATORY_CARE_PROVIDER_SITE_OTHER): Payer: Self-pay | Admitting: Family Medicine

## 2020-10-27 DIAGNOSIS — E559 Vitamin D deficiency, unspecified: Secondary | ICD-10-CM

## 2020-10-27 NOTE — Progress Notes (Signed)
Chief Complaint:   OBESITY Sharon [name] is here to discuss her progress with her obesity treatment plan along with follow-up of her obesity related diagnoses. Sharon [name] is on the Category 3 Plan and keeping a food journal and adhering to recommended goals of 1600-1700 calories and 120 grams protein and states she is following her eating plan approximately 40% of the time. Sharon [name] states she is playing tennis 60 minutes 4 times per week.  Today's visit was #: 19 Starting weight: 228 lbs Starting date: 10/30/2019 Today's weight: 197 lbs Today's date: 10/26/2020 Total lbs lost to date: 31 Total lbs lost since last in-office visit: 0  Interim History: Sharon [name] hasn't logged her food in app, as she has been busy. She is traveling for 2 weeks, then home for 3 days, then gone again. We discussed eating out strategies and travel eating strategies.  Subjective:   1. Pre-diabetes Sharon [name] is taking Ozempic and tolerating it well with no side effects. She requests increasing dose to help with weight loss.  2. Vitamin D deficiency She is currently taking prescription vitamin D 50,000 IU each week. She denies nausea, vomiting or muscle weakness.  Lab Results  Component Value Date   VD25OH 57.7 09/02/2020   VD25OH 46.1 02/16/2020   VD25OH 40.6 10/30/2019   3. At risk for side effect of medication Sharon [name] is at risk for side effects of medication due to increased dose of Semaglutide.  Assessment/Plan:  No orders of the defined types were placed in this encounter.   Medications Discontinued During This Encounter  Medication Reason   Semaglutide, 1 MG/DOSE, 4 MG/3ML SOPN Dose change   Vitamin D, Ergocalciferol, (DRISDOL) 1.25 MG (50000 UNIT) CAPS capsule Reorder     Meds ordered this encounter  Medications   Vitamin D, Ergocalciferol, (DRISDOL) 1.25 MG (50000 UNIT) CAPS capsule    Sig: Take 1 capsule (50,000 Units total) by mouth every 7 (seven) days.    Dispense:  4 capsule    Refill:  0    Ov for RF    Semaglutide, 2 MG/DOSE, 8 MG/3ML SOPN    Sig: Inject 2 mg as directed once a week.    Dispense:  3 mL    Refill:  0    30 d supply;  ** OV for RF **   Do not send RF request     1. Pre-diabetes Sharon [name] may increase Ozempic to 2 mg weekly and will continue to work on weight loss, exercise, and decreasing simple carbohydrates to help decrease the risk of diabetes.   Increase and Refill- Semaglutide, 2 MG/DOSE, 8 MG/3ML SOPN; Inject 2 mg as directed once a week.  Dispense: 3 mL; Refill: 0  2. Vitamin D deficiency Low Vitamin D level contributes to fatigue and are associated with obesity, breast, and colon cancer. She agrees to continue to take prescription Vitamin D 50,000 IU every week and will follow-up for routine testing of Vitamin D, at least 2-3 times per year to avoid over-replacement.  Refill- Vitamin D, Ergocalciferol, (DRISDOL) 1.25 MG (50000 UNIT) CAPS capsule; Take 1 capsule (50,000 Units total) by mouth every 7 (seven) days.  Dispense: 4 capsule; Refill: 0  3. At risk for side effect of medication Sharon [name] was given approximately 9 minutes of drug side effect counseling today.  We discussed side effect possibility and risk versus benefits. Sharon [name] agreed to the medication and will contact this office if these side effects are intolerable.  Repetitive spaced learning was employed today to elicit  superior memory formation and behavioral change.   4. Obesity with current BMI of 29.2  Sharon [name] is currently in the action stage of change. As such, her goal is to continue with weight loss efforts. She has agreed to the Category 3 Plan and keeping a food journal and adhering to recommended goals of 1600-1700 calories and 120+ grams protein.   Goal: to journal and bring in log to next OV.  Exercise goals:  As is  Behavioral modification strategies: increasing lean protein intake, decreasing simple carbohydrates, and planning for success.  Sharon [name] has agreed to follow-up with our clinic in 3-4 weeks.  She was informed of the importance of frequent follow-up visits to maximize her success with intensive lifestyle modifications for her multiple health conditions.   Objective:   Blood pressure 107/73, pulse 62, temperature 97.9 F (36.6 C), height 5\' 9"  (1.753 m), weight 197 lb (89.4 kg), last menstrual period 03/27/2013, SpO2 96 %. Body mass index is 29.09 kg/m.  General: Cooperative, alert, well developed, in no acute distress. HEENT: Conjunctivae and lids unremarkable. Cardiovascular: Regular rhythm.  Lungs: Normal work of breathing. Neurologic: No focal deficits.   Lab Results  Component Value Date   CREATININE 0.82 09/02/2020   BUN 20 09/02/2020   NA 140 09/02/2020   K 4.7 09/02/2020   CL 101 09/02/2020   CO2 27 09/02/2020   Lab Results  Component Value Date   ALT 16 09/02/2020   AST 24 09/02/2020   ALKPHOS 71 09/02/2020   BILITOT 0.3 09/02/2020   Lab Results  Component Value Date   HGBA1C 5.7 (H) 09/02/2020   HGBA1C 5.9 (H) 02/16/2020   HGBA1C 6.1 (H) 10/30/2019   Lab Results  Component Value Date   INSULIN 14.4 09/02/2020   INSULIN 10.9 02/16/2020   INSULIN 20.2 10/30/2019   Lab Results  Component Value Date   TSH 1.570 10/30/2019   Lab Results  Component Value Date   CHOL 215 (H) 02/16/2020   HDL 67 02/16/2020   LDLCALC 134 (H) 02/16/2020   TRIG 79 02/16/2020   CHOLHDL 3.2 02/16/2020   Lab Results  Component Value Date   VD25OH 57.7 09/02/2020   VD25OH 46.1 02/16/2020   VD25OH 40.6 10/30/2019   Lab Results  Component Value Date   WBC 5.0 10/30/2019   HGB 12.9 10/30/2019   HCT 39.4 10/30/2019   MCV 86 10/30/2019   PLT 245 10/30/2019    Attestation Statements:   Reviewed by clinician on day of visit: allergies, medications, problem list, medical history, surgical history, family history, social history, and previous encounter notes.  12/30/2019, CMA, am acting as transcriptionist for Edmund Hilda, DO.  I have reviewed the  above documentation for accuracy and completeness, and I agree with the above. [employer], D.O.  The 21st Century Cures Act was signed into law in 2016 which includes the topic of electronic health records.  This provides immediate access to information in MyChart.  This includes consultation notes, operative notes, office notes, lab results and pathology reports.  If you have any questions about what you read please let 2017 know at your next visit so we can discuss your concerns and take corrective action if need be.  We are right here with you.

## 2020-11-03 ENCOUNTER — Encounter (INDEPENDENT_AMBULATORY_CARE_PROVIDER_SITE_OTHER): Payer: Self-pay | Admitting: Family Medicine

## 2020-11-04 ENCOUNTER — Other Ambulatory Visit: Payer: Self-pay | Admitting: Family Medicine

## 2020-11-04 DIAGNOSIS — Z1231 Encounter for screening mammogram for malignant neoplasm of breast: Secondary | ICD-10-CM

## 2020-11-04 NOTE — Telephone Encounter (Signed)
Please see message and advise.  Thank you. Last OV pt was put on Ozempic 2mg 

## 2020-11-04 NOTE — Telephone Encounter (Signed)
Previous message sent to Dr. Dalbert Garnet

## 2020-11-23 ENCOUNTER — Ambulatory Visit (INDEPENDENT_AMBULATORY_CARE_PROVIDER_SITE_OTHER): Payer: BC Managed Care – PPO | Admitting: Family Medicine

## 2020-11-23 ENCOUNTER — Other Ambulatory Visit: Payer: Self-pay

## 2020-11-23 ENCOUNTER — Encounter (INDEPENDENT_AMBULATORY_CARE_PROVIDER_SITE_OTHER): Payer: Self-pay | Admitting: Family Medicine

## 2020-11-23 ENCOUNTER — Other Ambulatory Visit (HOSPITAL_COMMUNITY): Payer: Self-pay

## 2020-11-23 VITALS — BP 108/72 | HR 74 | Temp 97.9°F | Ht 69.0 in | Wt 200.0 lb

## 2020-11-23 DIAGNOSIS — R0602 Shortness of breath: Secondary | ICD-10-CM | POA: Diagnosis not present

## 2020-11-23 DIAGNOSIS — Z9189 Other specified personal risk factors, not elsewhere classified: Secondary | ICD-10-CM

## 2020-11-23 DIAGNOSIS — E669 Obesity, unspecified: Secondary | ICD-10-CM

## 2020-11-23 DIAGNOSIS — E559 Vitamin D deficiency, unspecified: Secondary | ICD-10-CM | POA: Diagnosis not present

## 2020-11-23 DIAGNOSIS — R7303 Prediabetes: Secondary | ICD-10-CM | POA: Diagnosis not present

## 2020-11-23 DIAGNOSIS — Z6833 Body mass index (BMI) 33.0-33.9, adult: Secondary | ICD-10-CM

## 2020-11-23 MED ORDER — VITAMIN D (ERGOCALCIFEROL) 1.25 MG (50000 UNIT) PO CAPS: 50000.0000 [IU] | ORAL_CAPSULE | ORAL | 0 refills | Status: DC

## 2020-11-23 MED ORDER — SEMAGLUTIDE (2 MG/DOSE) 8 MG/3ML ~~LOC~~ SOPN
2.0000 mg | PEN_INJECTOR | SUBCUTANEOUS | 0 refills | Status: DC
  Filled 2020-11-23: qty 3, 28d supply, fill #0

## 2020-11-23 NOTE — Progress Notes (Signed)
Chief Complaint:   OBESITY Sharon [name] is here to discuss her progress with her obesity treatment plan along with follow-up of her obesity related diagnoses. Sharon [name] is on the Category 3 Plan and keeping a food journal and adhering to recommended goals of 1600-1700 calories and 120 grams protein and states she is following her eating plan approximately 30% of the time. Sharon [name] states she is walking 60 minutes 4 times per week.  Today's visit was #: 20 Starting weight: 228 lbs Starting date: 10/30/2019 Today's weight: 200 lbs Today's date: 11/23/2020 Total lbs lost to date: 28 Total lbs lost since last in-office visit: +3  Interim History: Sharon [name] was traveling most days in the last 3 weeks to [facility] to see her daughter who was struggling emotionally. It was very difficult to eat on plan the entire time with staying in a hotel etc.   No issues with the plan and "knows what to do."    She is here to repeat her IC today, as well.  Subjective:   1. Pre-diabetes Pharmacy hasn't had 2 mg Ozempic in stock, so pt has been off it for 3 weeks.   2. Vitamin D deficiency She is currently taking prescription vitamin D 50,000 IU each week. She denies nausea, vomiting or muscle weakness.  3. Shortness of breath on exertion Sharon [name] notes increasing shortness of breath with exercising and seems to be worsening over time with weight gain. She notes getting out of breath sooner with activity than she used to. This has gotten worse recently. Sharon [name] denies shortness of breath at rest or orthopnea.  4. At risk for malnutrition Sharon [name] is at risk for malnutrition due to traveling and skipping meals and eating out.  Assessment/Plan:   Medications Discontinued During This Encounter  Medication Reason   Vitamin D, Ergocalciferol, (DRISDOL) 1.25 MG (50000 UNIT) CAPS capsule Reorder   Semaglutide, 2 MG/DOSE, 8 MG/3ML SOPN Reorder     Meds ordered this encounter  Medications   Semaglutide, 2 MG/DOSE, 8 MG/3ML SOPN    Sig:  Inject 2 mg as directed once a week.    Dispense:  3 mL    Refill:  0    30 d supply;  ** OV for RF **   Do not send RF request   Vitamin D, Ergocalciferol, (DRISDOL) 1.25 MG (50000 UNIT) CAPS capsule    Sig: Take 1 capsule (50,000 Units total) by mouth every 7 (seven) days.    Dispense:  4 capsule    Refill:  0    Ov for RF     1. Pre-diabetes Sharon [name] will continue to work on weight loss, exercise, and decreasing simple carbohydrates to help decrease the risk of diabetes.  Rx sent to new pharmacy. Pt cautioned against going to full 2 mg dose right away due to potential side effects ( since been off it for 3 wks).  Explained to pt how to taper it up.   Refill- Semaglutide, 2 MG/DOSE, 8 MG/3ML SOPN; Inject 2 mg as directed once a week.  Dispense: 3 mL; Refill: 0   2. Vitamin D deficiency Low Vitamin D level contributes to fatigue and are associated with obesity, breast, and colon cancer. She agrees to continue to take prescription Vitamin D 50,000 IU every week and will follow-up for routine testing of Vitamin D, at least 2-3 times per year to avoid over-replacement.  Refill- Vitamin D, Ergocalciferol, (DRISDOL) 1.25 MG (50000 UNIT) CAPS capsule; Take 1 capsule (50,000 Units total) by mouth  every 7 (seven) days.  Dispense: 4 capsule; Refill: 0   3. Shortness of breath on exertion Sharon [name] does feel that she gets out of breath more easily that she used to when she exercises. Sharon [name]'s shortness of breath appears to be obesity related and exercise induced. She has agreed to work on weight loss and gradually increase exercise to treat her exercise induced shortness of breath. Will continue to monitor closely. ReCheck IC.   4. At risk for malnutrition Sharon [name] was given approximately 9 minutes of counseling today regarding prevention of malnutrition and ways to meet macronutrient goals..    5. Obesity with current BMI 29.6 Sharon [name] is currently in the action stage of change. As such, her goal is to  continue with weight loss efforts. She has agreed to the Category 2 Plan or keeping a food journal and adhering to recommended goals of 1300-1500 calories and 100+ grams protein.   Due to drop in IC/RMR, change from category 3 to category 2.  Exercise goals:  As is  Behavioral modification strategies: increasing lean protein intake, decreasing simple carbohydrates, no skipping meals, meal planning and cooking strategies, and travel eating strategies.  Sharon [name] has agreed to follow-up with our clinic in 4 weeks. She was informed of the importance of frequent follow-up visits to maximize her success with intensive lifestyle modifications for her multiple health conditions.    Objective:   Blood pressure 108/72, pulse 74, temperature 97.9 F (36.6 C), height 5\' 9"  (1.753 m), weight 200 lb (90.7 kg), last menstrual period 03/27/2013, SpO2 (!) 9 %. Body mass index is 29.53 kg/m.  General: Cooperative, alert, well developed, in no acute distress. HEENT: Conjunctivae and lids unremarkable. Cardiovascular: Regular rhythm.  Lungs: Normal work of breathing. Neurologic: No focal deficits.   Lab Results  Component Value Date   CREATININE 0.82 09/02/2020   BUN 20 09/02/2020   NA 140 09/02/2020   K 4.7 09/02/2020   CL 101 09/02/2020   CO2 27 09/02/2020   Lab Results  Component Value Date   ALT 16 09/02/2020   AST 24 09/02/2020   ALKPHOS 71 09/02/2020   BILITOT 0.3 09/02/2020   Lab Results  Component Value Date   HGBA1C 5.7 (H) 09/02/2020   HGBA1C 5.9 (H) 02/16/2020   HGBA1C 6.1 (H) 10/30/2019   Lab Results  Component Value Date   INSULIN 14.4 09/02/2020   INSULIN 10.9 02/16/2020   INSULIN 20.2 10/30/2019   Lab Results  Component Value Date   TSH 1.570 10/30/2019   Lab Results  Component Value Date   CHOL 215 (H) 02/16/2020   HDL 67 02/16/2020   LDLCALC 134 (H) 02/16/2020   TRIG 79 02/16/2020   CHOLHDL 3.2 02/16/2020   Lab Results  Component Value Date   VD25OH 57.7  09/02/2020   VD25OH 46.1 02/16/2020   VD25OH 40.6 10/30/2019   Lab Results  Component Value Date   WBC 5.0 10/30/2019   HGB 12.9 10/30/2019   HCT 39.4 10/30/2019   MCV 86 10/30/2019   PLT 245 10/30/2019    Attestation Statements:   Reviewed by clinician on day of visit: allergies, medications, problem list, medical history, surgical history, family history, social history, and previous encounter notes.  12/30/2019, CMA, am acting as transcriptionist for Edmund Hilda, DO.  I have reviewed the above documentation for accuracy and completeness, and I agree with the above. [employer], D.O.  The 21st Century Cures Act was signed into law in 2016  which includes the topic of electronic health records.  This provides immediate access to information in MyChart.  This includes consultation notes, operative notes, office notes, lab results and pathology reports.  If you have any questions about what you read please let us know at your next visit so we can discuss your concerns and take corrective action if need be.  We are right here with you.

## 2020-12-08 ENCOUNTER — Ambulatory Visit
Admission: RE | Admit: 2020-12-08 | Discharge: 2020-12-08 | Disposition: A | Discharge status: Home / Self Care | Payer: BC Managed Care – PPO | Source: Ambulatory Visit | Attending: Family Medicine | Admitting: Family Medicine

## 2020-12-08 ENCOUNTER — Other Ambulatory Visit: Payer: Self-pay

## 2020-12-08 DIAGNOSIS — Z1231 Encounter for screening mammogram for malignant neoplasm of breast: Secondary | ICD-10-CM

## 2020-12-21 ENCOUNTER — Other Ambulatory Visit: Payer: Self-pay

## 2020-12-21 ENCOUNTER — Encounter (INDEPENDENT_AMBULATORY_CARE_PROVIDER_SITE_OTHER): Payer: Self-pay | Admitting: Family Medicine

## 2020-12-21 ENCOUNTER — Other Ambulatory Visit (HOSPITAL_COMMUNITY): Payer: Self-pay

## 2020-12-21 ENCOUNTER — Ambulatory Visit (INDEPENDENT_AMBULATORY_CARE_PROVIDER_SITE_OTHER): Payer: BC Managed Care – PPO | Admitting: Family Medicine

## 2020-12-21 VITALS — BP 98/65 | HR 69 | Temp 98.2°F | Ht 69.0 in | Wt 196.0 lb

## 2020-12-21 DIAGNOSIS — E7849 Other hyperlipidemia: Secondary | ICD-10-CM | POA: Diagnosis not present

## 2020-12-21 DIAGNOSIS — E559 Vitamin D deficiency, unspecified: Secondary | ICD-10-CM

## 2020-12-21 DIAGNOSIS — R7303 Prediabetes: Secondary | ICD-10-CM | POA: Diagnosis not present

## 2020-12-21 DIAGNOSIS — Z9189 Other specified personal risk factors, not elsewhere classified: Secondary | ICD-10-CM

## 2020-12-21 DIAGNOSIS — E669 Obesity, unspecified: Secondary | ICD-10-CM

## 2020-12-21 DIAGNOSIS — Z6833 Body mass index (BMI) 33.0-33.9, adult: Secondary | ICD-10-CM

## 2020-12-21 DIAGNOSIS — R5383 Other fatigue: Secondary | ICD-10-CM

## 2020-12-21 MED ORDER — VITAMIN D (ERGOCALCIFEROL) 1.25 MG (50000 UNIT) PO CAPS: 50000.0000 [IU] | ORAL_CAPSULE | ORAL | 0 refills | Status: DC

## 2020-12-21 MED ORDER — SEMAGLUTIDE (2 MG/DOSE) 8 MG/3ML ~~LOC~~ SOPN
2.0000 mg | PEN_INJECTOR | SUBCUTANEOUS | 0 refills | Status: DC
  Filled 2020-12-21: qty 3, 28d supply, fill #0

## 2020-12-21 NOTE — Progress Notes (Signed)
Chief Complaint:   OBESITY Sharon [name] is here to discuss her progress with her obesity treatment plan along with follow-up of her obesity related diagnoses. Sharon [name] is on the Category 2 Plan or keeping a food journal and adhering to recommended goals of 1300-1500 calories and 100+ grams of protein daily and states she is following her eating plan approximately 40% of the time. Sharon [name] states she is Tennis for 90 minutes 3 times per week.  Today's visit was #: 21 Starting weight: 228 lbs Starting date: 10/30/2019 Today's weight: 196 lbs Today's date: 12/21/2020 Total lbs lost to date: 32 Total lbs lost since last in-office visit: 4  Interim History: Sharon [name] has been traveling a lot for the past few weeks, and she made smarter choices and portion controlled. She is starting back at the gym also this week. She has been making an effort to eat more protein, and less carbohydrates and much less junk food especially. She is now back on Ozempic.  Subjective:   1. Pre-diabetes Sharon [name] has a diagnosis of pre-diabetes based on her elevated HgA1c and was informed this puts her at greater risk of developing diabetes. She continues to work on diet and exercise to decrease her risk of diabetes. She denies nausea or hypoglycemia.  2. Other fatigue Sharon [name] has a history of fatigue and she is due to have labs rechecked soon.  3. Vitamin D deficiency Sharon [name] is currently taking prescription vitamin D 50,000 IU each week. She denies nausea, vomiting or muscle weakness.  4. Other hyperlipidemia Sharon [name] has hyperlipidemia and her last LDL was elevated. She is not on medications currently. She has been trying to improve her cholesterol levels with intensive lifestyle modification including a low saturated fat diet, exercise and weight loss. She denies any chest pain, claudication or myalgias.  5. At risk for malnutrition Sharon [name] is at increased risk for malnutrition due to occasionally skipping meals.  Assessment/Plan:    Orders Placed This Encounter  Procedures   Insulin, random   Hemoglobin A1c   VITAMIN D 25 Hydroxy (Vit-D Deficiency, Fractures)   Lipid Panel With LDL/HDL Ratio   Comprehensive metabolic panel   TSH   T4, free   CBC with Differential/Platelet   Vitamin B12    Medications Discontinued During This Encounter  Medication Reason   Semaglutide, 2 MG/DOSE, 8 MG/3ML SOPN Reorder   Vitamin D, Ergocalciferol, (DRISDOL) 1.25 MG (50000 UNIT) CAPS capsule Reorder     Meds ordered this encounter  Medications   Semaglutide, 2 MG/DOSE, 8 MG/3ML SOPN    Sig: Inject 2 mg as directed once a week.    Dispense:  3 mL    Refill:  0    30 d supply;  ** OV for RF **   Do not send RF request   Vitamin D, Ergocalciferol, (DRISDOL) 1.25 MG (50000 UNIT) CAPS capsule    Sig: Take 1 capsule (50,000 Units total) by mouth every 7 (seven) days.    Dispense:  4 capsule    Refill:  0    Ov for RF     1. Pre-diabetes Sharon [name] will continue Ozempic 2 mg q weekly, and we will refill for 1 month. She will continue her prudent nutritional plan, exercise, and decreasing simple carbohydrates to help decrease the risk of diabetes. We will recheck labs at her next office visit.  - Semaglutide, 2 MG/DOSE, 8 MG/3ML SOPN; Inject 2 mg as directed once a week.  Dispense: 3 mL; Refill: 0 - Insulin, random -  Hemoglobin A1c  2. Other fatigue We will recheck labs at her next office visit, and in the meanwhile, Sharon [name] will focus on self care including making healthy food choices, increasing physical activity and focusing on stress reduction.  - TSH - T4, free - CBC with Differential/Platelet - Vitamin B12  3. Vitamin D deficiency Low Vitamin D level contributes to fatigue and are associated with obesity, breast, and colon cancer. We will refill prescription Vitamin D 50,000 IU every week for 1 month. We will recheck labs at her next office visit. Sharon [name] will follow-up for routine testing of Vitamin D, at least 2-3 times per  year to avoid over-replacement.  - Vitamin D, Ergocalciferol, (DRISDOL) 1.25 MG (50000 UNIT) CAPS capsule; Take 1 capsule (50,000 Units total) by mouth every 7 (seven) days.  Dispense: 4 capsule; Refill: 0 - VITAMIN D 25 Hydroxy (Vit-D Deficiency, Fractures)  4. Other hyperlipidemia Cardiovascular risk and specific lipid/LDL goals reviewed. We discussed several lifestyle modifications today. We will recheck labs at her next office visit. Sharon [name] will continue to work on diet, exercise and weight loss efforts. Orders and follow up as documented in patient record.   Counseling Intensive lifestyle modifications are the first line treatment for this issue. Dietary changes: Increase soluble fiber. Decrease simple carbohydrates. Exercise changes: Moderate to vigorous-intensity aerobic activity 150 minutes per week if tolerated. Lipid-lowering medications: see documented in medical record.  - Lipid Panel With LDL/HDL Ratio - Comprehensive metabolic panel  5. At risk for malnutrition Sharon [name] was given approximately 9 minutes of counseling today regarding prevention of malnutrition and ways to meet macronutrient goals.   6. Obesity with current BMI of 29.0 Sharon [name] is currently in the action stage of change. As such, her goal is to continue with weight loss efforts. She has agreed to the Category 2 Plan or keeping a food journal and adhering to recommended goals of 1300-1500 calories and 100+ grams of protein daily.   Sharon [name] is to come fasting for labs at her next office visit.  Exercise goals: As is.  Behavioral modification strategies: no skipping meals and celebration eating strategies.  Sharon [name] has agreed to follow-up with our clinic in 2 to 3 weeks. She was informed of the importance of frequent follow-up visits to maximize her success with intensive lifestyle modifications for her multiple health conditions.   Sharon [name] was informed we would discuss her lab results at her next visit unless there is a  critical issue that needs to be addressed sooner. Sharon [name] agreed to keep her next visit at the agreed upon time to discuss these results.  Objective:   Blood pressure 98/65, pulse 69, temperature 98.2 F (36.8 C), height 5\' 9"  (1.753 m), weight 196 lb (88.9 kg), last menstrual period 03/27/2013, SpO2 98 %. Body mass index is 28.94 kg/m.  General: Cooperative, alert, well developed, in no acute distress. HEENT: Conjunctivae and lids unremarkable. Cardiovascular: Regular rhythm.  Lungs: Normal work of breathing. Neurologic: No focal deficits.   Lab Results  Component Value Date   CREATININE 0.82 09/02/2020   BUN 20 09/02/2020   NA 140 09/02/2020   K 4.7 09/02/2020   CL 101 09/02/2020   CO2 27 09/02/2020   Lab Results  Component Value Date   ALT 16 09/02/2020   AST 24 09/02/2020   ALKPHOS 71 09/02/2020   BILITOT 0.3 09/02/2020   Lab Results  Component Value Date   HGBA1C 5.7 (H) 09/02/2020   HGBA1C 5.9 (H) 02/16/2020   HGBA1C 6.1 (H) 10/30/2019  Lab Results  Component Value Date   INSULIN 14.4 09/02/2020   INSULIN 10.9 02/16/2020   INSULIN 20.2 10/30/2019   Lab Results  Component Value Date   TSH 1.570 10/30/2019   Lab Results  Component Value Date   CHOL 215 (H) 02/16/2020   HDL 67 02/16/2020   LDLCALC 134 (H) 02/16/2020   TRIG 79 02/16/2020   CHOLHDL 3.2 02/16/2020   Lab Results  Component Value Date   VD25OH 57.7 09/02/2020   VD25OH 46.1 02/16/2020   VD25OH 40.6 10/30/2019   Lab Results  Component Value Date   WBC 5.0 10/30/2019   HGB 12.9 10/30/2019   HCT 39.4 10/30/2019   MCV 86 10/30/2019   PLT 245 10/30/2019   No results found for: IRON, TIBC, FERRITIN  Attestation Statements:   Reviewed by clinician on day of visit: allergies, medications, problem list, medical history, surgical history, family history, social history, and previous encounter notes.   Trude Mcburney, am acting as transcriptionist for [employer], DO.  I have  reviewed the above documentation for accuracy and completeness, and I agree with the above. [name], D.O.  The 21st Century Cures Act was signed into law in 2016 which includes the topic of electronic health records.  This provides immediate access to information in MyChart.  This includes consultation notes, operative notes, office notes, lab results and pathology reports.  If you have any questions about what you read please let us know at your next visit so we can discuss your concerns and take corrective action if need be.  We are right here with you.

## 2020-12-30 ENCOUNTER — Ambulatory Visit (INDEPENDENT_AMBULATORY_CARE_PROVIDER_SITE_OTHER): Payer: BC Managed Care – PPO | Admitting: Sports Medicine

## 2020-12-30 DIAGNOSIS — M1611 Unilateral primary osteoarthritis, right hip: Secondary | ICD-10-CM

## 2020-12-30 NOTE — Assessment & Plan Note (Signed)
I see worsening of her function of the right hip from her last exam I discussed with her and her husband and they are strongly considering evaluation for hip replacement I will refer her to orthopedics for this assessment

## 2020-12-30 NOTE — Progress Notes (Signed)
Chief complaint follow-up of right hip osteoarthritis  Patient has continued playing tennis in spite of her hip arthritis She does well on the tennis courts However after playing she is very stiff and has pain Recently she is having more nighttime pain and is very hard to turn over She comes with her husband who notes she sometimes has a lot of difficulty getting up steps or getting around They did have an orthopedic consult but were not satisfied with the options  Review of systems A secondary problem that is probably not related is severe pain over her coccyx This is started over the last 2 months without a specific history of injury or remote childbirth injury  Physical exam Pleasant black female in no acute distress Ht 5\' 9"  (1.753 m)   Wt 199 lb (90.3 kg)   LMP 03/27/2013   BMI 29.39 kg/m  [facility] 12/16/2019 12/30/2020  Frequency of aerobic exercise (# of days/week) 5 5  Average time in minutes 45 90  Frequency of strengthening activities (# of days/week) 5 3   Right hip Range of motion is limited on flexion Significant limitation on internal rotation Decreased external rotation to about 25 degrees No weakness in hip abduction Good strength on hip flexion  Left hip shows full range of motion  Walking gait shows lack of flexion and pelvic rotation Mild antalgic gait

## 2021-01-10 DIAGNOSIS — R7303 Prediabetes: Secondary | ICD-10-CM | POA: Diagnosis not present

## 2021-01-10 DIAGNOSIS — R5383 Other fatigue: Secondary | ICD-10-CM | POA: Diagnosis not present

## 2021-01-10 DIAGNOSIS — E559 Vitamin D deficiency, unspecified: Secondary | ICD-10-CM | POA: Diagnosis not present

## 2021-01-10 DIAGNOSIS — E7849 Other hyperlipidemia: Secondary | ICD-10-CM | POA: Diagnosis not present

## 2021-01-11 ENCOUNTER — Ambulatory Visit (INDEPENDENT_AMBULATORY_CARE_PROVIDER_SITE_OTHER): Payer: BC Managed Care – PPO | Admitting: Family Medicine

## 2021-01-11 ENCOUNTER — Other Ambulatory Visit: Payer: Self-pay

## 2021-01-11 ENCOUNTER — Encounter (INDEPENDENT_AMBULATORY_CARE_PROVIDER_SITE_OTHER): Payer: Self-pay | Admitting: Family Medicine

## 2021-01-11 VITALS — BP 107/71 | HR 65 | Temp 98.4°F | Ht 69.0 in | Wt 196.0 lb

## 2021-01-11 DIAGNOSIS — E559 Vitamin D deficiency, unspecified: Secondary | ICD-10-CM | POA: Diagnosis not present

## 2021-01-11 DIAGNOSIS — R7303 Prediabetes: Secondary | ICD-10-CM | POA: Diagnosis not present

## 2021-01-11 DIAGNOSIS — E669 Obesity, unspecified: Secondary | ICD-10-CM

## 2021-01-11 DIAGNOSIS — Z6833 Body mass index (BMI) 33.0-33.9, adult: Secondary | ICD-10-CM

## 2021-01-11 DIAGNOSIS — E7849 Other hyperlipidemia: Secondary | ICD-10-CM

## 2021-01-11 DIAGNOSIS — Z9189 Other specified personal risk factors, not elsewhere classified: Secondary | ICD-10-CM | POA: Diagnosis not present

## 2021-01-11 LAB — CBC WITH DIFFERENTIAL/PLATELET
Basophils Absolute: 0 10*3/uL (ref 0.0–0.2)
Basos: 1 %
EOS (ABSOLUTE): 0.2 10*3/uL (ref 0.0–0.4)
Eos: 3 %
Hematocrit: 39.9 % (ref 34.0–46.6)
Hemoglobin: 13.4 g/dL (ref 11.1–15.9)
Immature Grans (Abs): 0 10*3/uL (ref 0.0–0.1)
Immature Granulocytes: 0 %
Lymphocytes Absolute: 2 10*3/uL (ref 0.7–3.1)
Lymphs: 38 %
MCH: 28.6 pg (ref 26.6–33.0)
MCHC: 33.6 g/dL (ref 31.5–35.7)
MCV: 85 fL (ref 79–97)
Monocytes Absolute: 0.4 10*3/uL (ref 0.1–0.9)
Monocytes: 7 %
Neutrophils Absolute: 2.8 10*3/uL (ref 1.4–7.0)
Neutrophils: 51 %
Platelets: 245 10*3/uL (ref 150–450)
RBC: 4.69 x10E6/uL (ref 3.77–5.28)
RDW: 13.4 % (ref 11.7–15.4)
WBC: 5.4 10*3/uL (ref 3.4–10.8)

## 2021-01-11 LAB — COMPREHENSIVE METABOLIC PANEL
ALT: 21 IU/L (ref 0–32)
AST: 24 IU/L (ref 0–40)
Albumin/Globulin Ratio: 1.6 (ref 1.2–2.2)
Albumin: 4.4 g/dL (ref 3.8–4.9)
Alkaline Phosphatase: 66 IU/L (ref 44–121)
BUN/Creatinine Ratio: 24 — ABNORMAL HIGH (ref 9–23)
BUN: 19 mg/dL (ref 6–24)
Bilirubin Total: 0.3 mg/dL (ref 0.0–1.2)
CO2: 27 mmol/L (ref 20–29)
Calcium: 9.8 mg/dL (ref 8.7–10.2)
Chloride: 103 mmol/L (ref 96–106)
Creatinine, Ser: 0.79 mg/dL (ref 0.57–1.00)
Globulin, Total: 2.8 g/dL (ref 1.5–4.5)
Glucose: 88 mg/dL (ref 70–99)
Potassium: 4.5 mmol/L (ref 3.5–5.2)
Sodium: 142 mmol/L (ref 134–144)
Total Protein: 7.2 g/dL (ref 6.0–8.5)
eGFR: 88 mL/min/{1.73_m2} (ref >59)

## 2021-01-11 LAB — LIPID PANEL WITH LDL/HDL RATIO
Cholesterol, Total: 196 mg/dL (ref 100–199)
HDL: 69 mg/dL (ref >39)
LDL Chol Calc (NIH): 114 mg/dL — ABNORMAL HIGH (ref 0–99)
LDL/HDL Ratio: 1.7 ratio (ref 0.0–3.2)
Triglycerides: 72 mg/dL (ref 0–149)
VLDL Cholesterol Cal: 13 mg/dL (ref 5–40)

## 2021-01-11 LAB — T4, FREE: Free T4: 1.28 ng/dL (ref 0.82–1.77)

## 2021-01-11 LAB — HEMOGLOBIN A1C
Est. average glucose Bld gHb Est-mCnc: 117 mg/dL
Hgb A1c MFr Bld: 5.7 % — ABNORMAL HIGH (ref 4.8–5.6)

## 2021-01-11 LAB — TSH: TSH: 1.57 u[IU]/mL (ref 0.450–4.500)

## 2021-01-11 LAB — VITAMIN D 25 HYDROXY (VIT D DEFICIENCY, FRACTURES): Vit D, 25-Hydroxy: 54.2 ng/mL (ref 30.0–100.0)

## 2021-01-11 LAB — INSULIN, RANDOM: INSULIN: 8.1 u[IU]/mL (ref 2.6–24.9)

## 2021-01-11 LAB — VITAMIN B12: Vitamin B-12: 659 pg/mL (ref 232–1245)

## 2021-01-11 MED ORDER — VITAMIN D (ERGOCALCIFEROL) 1.25 MG (50000 UNIT) PO CAPS: 50000.0000 [IU] | ORAL_CAPSULE | ORAL | 0 refills | Status: DC

## 2021-01-11 MED ORDER — SEMAGLUTIDE (2 MG/DOSE) 8 MG/3ML ~~LOC~~ SOPN: 2.0000 mg | PEN_INJECTOR | SUBCUTANEOUS | 0 refills | Status: DC

## 2021-01-12 NOTE — Progress Notes (Signed)
Chief Complaint:   OBESITY Sharon [name] is here to discuss her progress with her obesity treatment plan along with follow-up of her obesity related diagnoses. Sharon [name] is on the Category 2 Plan or keeping a food journal and adhering to recommended goals of 1300-1500 calories and 100+ grams of protein daily and states she is following her eating plan approximately 50% of the time. Sharon [name] states she is playing Tennis, lifting weights, and doing pilates for 90 minutes 5 times per week.  Today's visit was #: 22 Starting weight: 228 lbs Starting date: 10/30/2019 Today's weight: 196 lbs Today's date: 01/11/2021 Total lbs lost to date: 32 Total lbs lost since last in-office visit: 0  Interim History: Sharon [name] has been baking a lot and doing holiday cooking etc. She is playing Tennis 5-6 days per week for 2-3 hours each time, and she has added in weight lifting 2 days per week as well. She is here to review all labs that were drawn yesterday as well.   Subjective:   1. Pre-diabetes Sharon [name]'s hunger is well controlled. Her A1c is the same at 5.7 and is at good control. Her fasting insulin is improving. I discussed labs with the patient today.  2. Vitamin D deficiency Sharon [name]'s Vit D level is at goal. She is currently taking prescription vitamin D 50,000 IU each week. She denies nausea, vomiting or muscle weakness. I discussed labs with the patient today.  3. Other hyperlipidemia Sharon [name]'s HDL has increased, and her LDL has decreased from prior (2 months ago). Her cholesterol has improved and she is not on medications. I discussed labs with the patient today.  4. At risk for heart disease Sharon [name] is at a higher than average risk for cardiovascular disease due to obesity.   Assessment/Plan:  No orders of the defined types were placed in this encounter.   Medications Discontinued During This Encounter  Medication Reason   Semaglutide, 2 MG/DOSE, 8 MG/3ML SOPN Reorder   Vitamin D, Ergocalciferol, (DRISDOL) 1.25 MG  (50000 UNIT) CAPS capsule Reorder     Meds ordered this encounter  Medications   Semaglutide, 2 MG/DOSE, 8 MG/3ML SOPN    Sig: Inject 2 mg as directed once a week.    Dispense:  3 mL    Refill:  0    30 d supply;  ** OV for RF **   Do not send RF request   Vitamin D, Ergocalciferol, (DRISDOL) 1.25 MG (50000 UNIT) CAPS capsule    Sig: Take 1 capsule (50,000 Units total) by mouth every 7 (seven) days.    Dispense:  4 capsule    Refill:  0    Ov for RF     1. Pre-diabetes We will refill Ozempic for 1 month, with no change in dose. [name] will continue with her prudent nutritional plan and weight loss.  - Semaglutide, 2 MG/DOSE, 8 MG/3ML SOPN; Inject 2 mg as directed once a week.  Dispense: 3 mL; Refill: 0  2. Vitamin D deficiency [name]  will continue prescription Vitamin D 50,000 IU every week, and we will refill for 1 month. She will follow-up for routine testing of Vitamin D, at least 2-3 times per year to avoid over-replacement.  - Vitamin D, Ergocalciferol, (DRISDOL) 1.25 MG (50000 UNIT) CAPS capsule; Take 1 capsule (50,000 Units total) by mouth every 7 (seven) days.  Dispense: 4 capsule; Refill: 0  3. Other hyperlipidemia Cardiovascular risk and specific lipid/LDL goals reviewed. We discussed several lifestyle modifications today. Sharon [name] will continue  her prudent nutritional plan, and weight loss with increased exercise. Orders and follow up as documented in patient record.   Counseling Intensive lifestyle modifications are the first line treatment for this issue. Dietary changes: Increase soluble fiber. Decrease simple carbohydrates. Exercise changes: Moderate to vigorous-intensity aerobic activity 150 minutes per week if tolerated. Lipid-lowering medications: see documented in medical record.  4. At risk for heart disease Due to Sharon [name]'s current state of health and medical condition(s), she is at a higher risk for heart disease.  This puts the patient at much greater risk to  subsequently develop cardiopulmonary conditions that can significantly affect patient's quality of life in a negative manner.    At least 10 minutes were spent on counseling Sharon [name] about these concerns today, and I stressed the importance of reversing risks factors of obesity, especially truncal and visceral fat, hypertension, hyperlipidemia, and pre-diabetes.  The initial goal is to lose at least 5-10% of starting weight to help reduce these risk factors.  Counseling:  Intensive lifestyle modifications were discussed with Sharon [name] as the most appropriate first line of treatment.  she will continue to work on diet, exercise, and weight loss efforts.  We will continue to reassess these conditions on a fairly regular basis in an attempt to decrease the patient's overall morbidity and mortality.  Evidence-based interventions for health behavior change were utilized today including the discussion of self monitoring techniques, problem-solving barriers, and SMART goal setting techniques.  Specifically, regarding patient's less desirable eating habits and patterns, we employed the technique of small changes when Sharon [name] has not been able to fully commit to her prudent nutritional plan.  5. Obesity with current BMI of 28.9 Sharon [name] is currently in the action stage of change. As such, her goal is to continue with weight loss efforts. She has agreed to the Category 2 Plan or keeping a food journal and adhering to recommended goals of 1300-1500 calories and 100+ grams of protein daily.   Sharon [name]'s goal is to continue with exercise through the holidays and not gain weight. Holiday eating strategy guide was given to the patient after a discussion was had.  Exercise goals: As is.  Behavioral modification strategies: holiday eating strategies  and celebration eating strategies.  Sharon [name] has agreed to follow-up with our clinic in 4 weeks. She was informed of the importance of frequent follow-up visits to maximize her success with  intensive lifestyle modifications for her multiple health conditions.   Objective:   Blood pressure 107/71, pulse 65, temperature 98.4 F (36.9 C), height 5\' 9"  (1.753 m), weight 196 lb (88.9 kg), last menstrual period 03/27/2013, SpO2 94 %. Body mass index is 28.94 kg/m.  General: Cooperative, alert, well developed, in no acute distress. HEENT: Conjunctivae and lids unremarkable. Cardiovascular: Regular rhythm.  Lungs: Normal work of breathing. Neurologic: No focal deficits.   Lab Results  Component Value Date   CREATININE 0.79 01/10/2021   BUN 19 01/10/2021   NA 142 01/10/2021   K 4.5 01/10/2021   CL 103 01/10/2021   CO2 27 01/10/2021   Lab Results  Component Value Date   ALT 21 01/10/2021   AST 24 01/10/2021   ALKPHOS 66 01/10/2021   BILITOT 0.3 01/10/2021   Lab Results  Component Value Date   HGBA1C 5.7 (H) 01/10/2021   HGBA1C 5.7 (H) 09/02/2020   HGBA1C 5.9 (H) 02/16/2020   HGBA1C 6.1 (H) 10/30/2019   Lab Results  Component Value Date   INSULIN 8.1 01/10/2021   INSULIN 14.4 09/02/2020  INSULIN 10.9 02/16/2020   INSULIN 20.2 10/30/2019   Lab Results  Component Value Date   TSH 1.570 01/10/2021   Lab Results  Component Value Date   CHOL 196 01/10/2021   HDL 69 01/10/2021   LDLCALC 114 (H) 01/10/2021   TRIG 72 01/10/2021   CHOLHDL 3.2 02/16/2020   Lab Results  Component Value Date   VD25OH 54.2 01/10/2021   VD25OH 57.7 09/02/2020   VD25OH 46.1 02/16/2020   Lab Results  Component Value Date   WBC 5.4 01/10/2021   HGB 13.4 01/10/2021   HCT 39.9 01/10/2021   MCV 85 01/10/2021   PLT 245 01/10/2021   No results found for: IRON, TIBC, FERRITIN  Attestation Statements:   Reviewed by clinician on day of visit: allergies, medications, problem list, medical history, surgical history, family history, social history, and previous encounter notes.   Trude Mcburney, am acting as transcriptionist for [employer], DO.  I have reviewed the above  documentation for accuracy and completeness, and I agree with the above. [name], D.O.  The 21st Century Cures Act was signed into law in 2016 which includes the topic of electronic health records.  This provides immediate access to information in MyChart.  This includes consultation notes, operative notes, office notes, lab results and pathology reports.  If you have any questions about what you read please let us know at your next visit so we can discuss your concerns and take corrective action if need be.  We are right here with you.

## 2021-01-25 DIAGNOSIS — Z1231 Encounter for screening mammogram for malignant neoplasm of breast: Secondary | ICD-10-CM | POA: Diagnosis not present

## 2021-01-25 DIAGNOSIS — Z1211 Encounter for screening for malignant neoplasm of colon: Secondary | ICD-10-CM | POA: Diagnosis not present

## 2021-01-25 DIAGNOSIS — Z124 Encounter for screening for malignant neoplasm of cervix: Secondary | ICD-10-CM | POA: Diagnosis not present

## 2021-01-25 DIAGNOSIS — Z01419 Encounter for gynecological examination (general) (routine) without abnormal findings: Secondary | ICD-10-CM | POA: Diagnosis not present

## 2021-01-26 DIAGNOSIS — Z124 Encounter for screening for malignant neoplasm of cervix: Secondary | ICD-10-CM | POA: Diagnosis not present

## 2021-02-03 ENCOUNTER — Other Ambulatory Visit (HOSPITAL_COMMUNITY): Payer: Self-pay

## 2021-02-03 ENCOUNTER — Encounter (INDEPENDENT_AMBULATORY_CARE_PROVIDER_SITE_OTHER): Payer: Self-pay | Admitting: Family Medicine

## 2021-02-03 ENCOUNTER — Ambulatory Visit (INDEPENDENT_AMBULATORY_CARE_PROVIDER_SITE_OTHER): Payer: BC Managed Care – PPO | Admitting: Family Medicine

## 2021-02-03 ENCOUNTER — Other Ambulatory Visit: Payer: Self-pay

## 2021-02-03 VITALS — BP 99/64 | HR 64 | Temp 98.1°F | Ht 69.0 in | Wt 197.0 lb

## 2021-02-03 DIAGNOSIS — E559 Vitamin D deficiency, unspecified: Secondary | ICD-10-CM

## 2021-02-03 DIAGNOSIS — Z6829 Body mass index (BMI) 29.0-29.9, adult: Secondary | ICD-10-CM

## 2021-02-03 DIAGNOSIS — E669 Obesity, unspecified: Secondary | ICD-10-CM

## 2021-02-03 DIAGNOSIS — Z9189 Other specified personal risk factors, not elsewhere classified: Secondary | ICD-10-CM

## 2021-02-03 DIAGNOSIS — R7303 Prediabetes: Secondary | ICD-10-CM

## 2021-02-03 MED ORDER — SEMAGLUTIDE (2 MG/DOSE) 8 MG/3ML ~~LOC~~ SOPN
2.0000 mg | PEN_INJECTOR | SUBCUTANEOUS | 0 refills | Status: DC
  Filled 2021-02-03 – 2021-02-15 (×2): qty 3, 28d supply, fill #0

## 2021-02-03 MED ORDER — VITAMIN D (ERGOCALCIFEROL) 1.25 MG (50000 UNIT) PO CAPS: 50000.0000 [IU] | ORAL_CAPSULE | ORAL | 0 refills | Status: DC

## 2021-02-07 ENCOUNTER — Other Ambulatory Visit (INDEPENDENT_AMBULATORY_CARE_PROVIDER_SITE_OTHER): Payer: Self-pay | Admitting: Family Medicine

## 2021-02-07 DIAGNOSIS — R7303 Prediabetes: Secondary | ICD-10-CM

## 2021-02-07 NOTE — Progress Notes (Signed)
Chief Complaint:   OBESITY Myla is here to discuss her progress with her obesity treatment plan along with follow-up of her obesity related diagnoses. Maliaka is on the Category 2 Plan or keeping a food journal and adhering to recommended goals of 1300-1500 calories and 100+ grams of protein daily and states she is following her eating plan approximately 30% of the time. Melek states she is playing Tennis for 90 minutes 5 times per week.  Today's visit was #: 23 Starting weight: 228 lbs Starting date: 10/30/2019 Today's weight: 197 lbs Today's date: 02/03/2021 Total lbs lost to date: 31 Total lbs lost since last in-office visit: 0  Interim History: Trenika reports that it has been difficult getting back on the plan after the holidays. She is surprised that she only gained 1 lb. She is still playing Tennis but she hasn't been to the gym as much lately. She is going to her primary care physician in the near future, and she will have labs from here printed out so she can give them a copy.   Subjective:   1. Pre-diabetes Philena notes that Ozempic has curbed her appetite and is helping with portion control when eating off the plan. She is able to eat al of her food on the plan without difficulty.  2. Vitamin D deficiency Tim Wilhide is tolerating medication(s) well without side effects. Her last Vit D level was at goal at 54.2 three weeks ago. Medication compliance is good and patient appears to be taking it as prescribed. Denies additional concerns regarding this condition.   3. At risk for deficient intake of food Atia is at a higher than average risk of deficient intake of food due to some days she skips meals or food.  Assessment/Plan:  No orders of the defined types were placed in this encounter.   Medications Discontinued During This Encounter  Medication Reason   Semaglutide, 2 MG/DOSE, 8 MG/3ML SOPN Reorder   Vitamin D, Ergocalciferol, (DRISDOL) 1.25 MG (50000 UNIT) CAPS  capsule Reorder     Meds ordered this encounter  Medications   Semaglutide, 2 MG/DOSE, 8 MG/3ML SOPN    Sig: Inject 2 mg as directed once a week.    Dispense:  3 mL    Refill:  0    30 d supply;  ** OV for RF **   Do not send RF request   Vitamin D, Ergocalciferol, (DRISDOL) 1.25 MG (50000 UNIT) CAPS capsule    Sig: Take 1 capsule (50,000 Units total) by mouth every 7 (seven) days.    Dispense:  4 capsule    Refill:  0    Ov for RF     1. Pre-diabetes We will refill Ozempic 2 mg weekly with no refills. Teckla will continue to work on weight loss, exercise, and decreasing simple carbohydrates to help decrease the risk of diabetes.   - Semaglutide, 2 MG/DOSE, 8 MG/3ML SOPN; Inject 2 mg as directed once a week.  Dispense: 3 mL; Refill: 0  2. Vitamin D deficiency Clella will continue prescription Vitamin D at the same dose, and we will refill for 1 month. She will follow-up for routine testing of Vitamin D, at least 2-3 times per year to avoid over-replacement.  - Vitamin D, Ergocalciferol, (DRISDOL) 1.25 MG (50000 UNIT) CAPS capsule; Take 1 capsule (50,000 Units total) by mouth every 7 (seven) days.  Dispense: 4 capsule; Refill: 0  3. At risk for deficient intake of food Aili was given  approximately 15 minutes of deficit intake of food prevention counseling today. Lacara is at risk for eating too few calories based on current food recall. She was encouraged to focus on meeting caloric and protein goals according to her recommended meal plan.   4. Obesity with current BMI of 29.1 Ronniesha is currently in the action stage of change. As such, her goal is to continue with weight loss efforts. She has agreed to the Category 2 Plan or keeping a food journal and adhering to recommended goals of 1300-1500 calories and 100+ grams of protein daily.   Markeita was asked to bring in a copy to [place] of all additional labs drawn by her primary care physician.  Exercise goals: As is.  Behavioral modification  strategies: meal planning and cooking strategies and keeping healthy foods in the home.  Maneh has agreed to follow-up with our clinic in 3 weeks. She was informed of the importance of frequent follow-up visits to maximize her success with intensive lifestyle modifications for her multiple health conditions.   Objective:   Blood pressure 99/64, pulse 64, temperature 98.1 F (36.7 C), height 5\' 9"  (1.753 m), weight 197 lb (89.4 kg), last menstrual period 03/27/2013, SpO2 98 %. Body mass index is 29.09 kg/m.  General: Cooperative, alert, well developed, in no acute distress. HEENT: Conjunctivae and lids unremarkable. Cardiovascular: Regular rhythm.  Lungs: Normal work of breathing. Neurologic: No focal deficits.   Lab Results  Component Value Date   CREATININE 0.79 01/10/2021   BUN 19 01/10/2021   NA 142 01/10/2021   K 4.5 01/10/2021   CL 103 01/10/2021   CO2 27 01/10/2021   Lab Results  Component Value Date   ALT 21 01/10/2021   AST 24 01/10/2021   ALKPHOS 66 01/10/2021   BILITOT 0.3 01/10/2021   Lab Results  Component Value Date   HGBA1C 5.7 (H) 01/10/2021   HGBA1C 5.7 (H) 09/02/2020   HGBA1C 5.9 (H) 02/16/2020   HGBA1C 6.1 (H) 10/30/2019   Lab Results  Component Value Date   INSULIN 8.1 01/10/2021   INSULIN 14.4 09/02/2020   INSULIN 10.9 02/16/2020   INSULIN 20.2 10/30/2019   Lab Results  Component Value Date   TSH 1.570 01/10/2021   Lab Results  Component Value Date   CHOL 196 01/10/2021   HDL 69 01/10/2021   LDLCALC 114 (H) 01/10/2021   TRIG 72 01/10/2021   CHOLHDL 3.2 02/16/2020   Lab Results  Component Value Date   VD25OH 54.2 01/10/2021   VD25OH 57.7 09/02/2020   VD25OH 46.1 02/16/2020   Lab Results  Component Value Date   WBC 5.4 01/10/2021   HGB 13.4 01/10/2021   HCT 39.9 01/10/2021   MCV 85 01/10/2021   PLT 245 01/10/2021   No results found for: IRON, TIBC, FERRITIN  Attestation Statements:   Reviewed by clinician on day of visit:  allergies, medications, problem list, medical history, surgical history, family history, social history, and previous encounter notes.   01/12/2021, am acting as transcriptionist for Trude Mcburney, DO.  I have reviewed the above documentation for accuracy and completeness, and I agree with the above. [employer], D.O.  The 21st Century Cures Act was signed into law in 2016 which includes the topic of electronic health records.  This provides immediate access to information in MyChart.  This includes consultation notes, operative notes, office notes, lab results and pathology reports.  If you have any questions about what you read please  let us know at your next visit so we can discuss your concerns and take corrective action if need be.  We are right here with you.

## 2021-02-10 DIAGNOSIS — Z23 Encounter for immunization: Secondary | ICD-10-CM | POA: Diagnosis not present

## 2021-02-10 DIAGNOSIS — Z0000 Encounter for general adult medical examination without abnormal findings: Secondary | ICD-10-CM | POA: Diagnosis not present

## 2021-02-15 ENCOUNTER — Other Ambulatory Visit (HOSPITAL_COMMUNITY): Payer: Self-pay

## 2021-02-26 ENCOUNTER — Other Ambulatory Visit (INDEPENDENT_AMBULATORY_CARE_PROVIDER_SITE_OTHER): Payer: Self-pay | Admitting: Family Medicine

## 2021-02-26 DIAGNOSIS — E559 Vitamin D deficiency, unspecified: Secondary | ICD-10-CM

## 2021-02-28 NOTE — Telephone Encounter (Signed)
LOV w/ Dr. O

## 2021-03-11 DIAGNOSIS — M25551 Pain in right hip: Secondary | ICD-10-CM | POA: Diagnosis not present

## 2021-03-11 DIAGNOSIS — M461 Sacroiliitis, not elsewhere classified: Secondary | ICD-10-CM | POA: Diagnosis not present

## 2021-03-11 DIAGNOSIS — M1611 Unilateral primary osteoarthritis, right hip: Secondary | ICD-10-CM | POA: Diagnosis not present

## 2021-03-17 ENCOUNTER — Ambulatory Visit (INDEPENDENT_AMBULATORY_CARE_PROVIDER_SITE_OTHER): Payer: BC Managed Care – PPO | Admitting: Family Medicine

## 2021-03-21 ENCOUNTER — Ambulatory Visit (INDEPENDENT_AMBULATORY_CARE_PROVIDER_SITE_OTHER): Payer: BC Managed Care – PPO | Admitting: Family Medicine

## 2021-03-23 ENCOUNTER — Ambulatory Visit (INDEPENDENT_AMBULATORY_CARE_PROVIDER_SITE_OTHER): Payer: BC Managed Care – PPO | Admitting: Family Medicine

## 2021-04-14 ENCOUNTER — Ambulatory Visit (INDEPENDENT_AMBULATORY_CARE_PROVIDER_SITE_OTHER): Payer: BC Managed Care – PPO | Admitting: Family Medicine

## 2021-05-17 DIAGNOSIS — Z23 Encounter for immunization: Secondary | ICD-10-CM | POA: Diagnosis not present

## 2021-07-05 ENCOUNTER — Encounter (INDEPENDENT_AMBULATORY_CARE_PROVIDER_SITE_OTHER): Payer: Self-pay | Admitting: Family Medicine

## 2021-07-05 ENCOUNTER — Ambulatory Visit (INDEPENDENT_AMBULATORY_CARE_PROVIDER_SITE_OTHER): Payer: BC Managed Care – PPO | Admitting: Family Medicine

## 2021-07-05 VITALS — BP 103/67 | HR 58 | Temp 97.9°F | Ht 69.0 in | Wt 205.6 lb

## 2021-07-05 DIAGNOSIS — E669 Obesity, unspecified: Secondary | ICD-10-CM

## 2021-07-05 DIAGNOSIS — E7849 Other hyperlipidemia: Secondary | ICD-10-CM | POA: Diagnosis not present

## 2021-07-05 DIAGNOSIS — K219 Gastro-esophageal reflux disease without esophagitis: Secondary | ICD-10-CM | POA: Diagnosis not present

## 2021-07-05 DIAGNOSIS — R7301 Impaired fasting glucose: Secondary | ICD-10-CM | POA: Diagnosis not present

## 2021-07-05 DIAGNOSIS — R632 Polyphagia: Secondary | ICD-10-CM | POA: Diagnosis not present

## 2021-07-05 DIAGNOSIS — E66811 Obesity, class 1: Secondary | ICD-10-CM

## 2021-07-05 DIAGNOSIS — Z9189 Other specified personal risk factors, not elsewhere classified: Secondary | ICD-10-CM

## 2021-07-05 DIAGNOSIS — E559 Vitamin D deficiency, unspecified: Secondary | ICD-10-CM

## 2021-07-05 DIAGNOSIS — Z6830 Body mass index (BMI) 30.0-30.9, adult: Secondary | ICD-10-CM

## 2021-07-05 MED ORDER — PANTOPRAZOLE SODIUM 20 MG PO TBEC: 20.0000 mg | DELAYED_RELEASE_TABLET | Freq: Every day | ORAL | 0 refills | Status: DC

## 2021-07-05 MED ORDER — VITAMIN D (ERGOCALCIFEROL) 1.25 MG (50000 UNIT) PO CAPS: 50000.0000 [IU] | ORAL_CAPSULE | ORAL | 0 refills | Status: DC

## 2021-07-05 MED ORDER — SEMAGLUTIDE-WEIGHT MANAGEMENT 0.25 MG/0.5ML ~~LOC~~ SOAJ: 0.2500 mg | SUBCUTANEOUS | 0 refills | Status: DC

## 2021-07-06 ENCOUNTER — Encounter (INDEPENDENT_AMBULATORY_CARE_PROVIDER_SITE_OTHER): Payer: Self-pay | Admitting: Family Medicine

## 2021-07-06 LAB — COMPREHENSIVE METABOLIC PANEL
ALT: 17 IU/L (ref 0–32)
AST: 17 IU/L (ref 0–40)
Albumin/Globulin Ratio: 1.5 (ref 1.2–2.2)
Albumin: 4.5 g/dL (ref 3.8–4.9)
Alkaline Phosphatase: 68 IU/L (ref 44–121)
BUN/Creatinine Ratio: 24 — ABNORMAL HIGH (ref 9–23)
BUN: 21 mg/dL (ref 6–24)
Bilirubin Total: 0.3 mg/dL (ref 0.0–1.2)
CO2: 27 mmol/L (ref 20–29)
Calcium: 9.8 mg/dL (ref 8.7–10.2)
Chloride: 103 mmol/L (ref 96–106)
Creatinine, Ser: 0.86 mg/dL (ref 0.57–1.00)
Globulin, Total: 3.1 g/dL (ref 1.5–4.5)
Glucose: 99 mg/dL (ref 70–99)
Potassium: 5.1 mmol/L (ref 3.5–5.2)
Sodium: 144 mmol/L (ref 134–144)
Total Protein: 7.6 g/dL (ref 6.0–8.5)
eGFR: 80 mL/min/{1.73_m2} (ref >59)

## 2021-07-06 LAB — LIPID PANEL
Chol/HDL Ratio: 3.1 ratio (ref 0.0–4.4)
Cholesterol, Total: 230 mg/dL — ABNORMAL HIGH (ref 100–199)
HDL: 75 mg/dL (ref >39)
LDL Chol Calc (NIH): 135 mg/dL — ABNORMAL HIGH (ref 0–99)
Triglycerides: 115 mg/dL (ref 0–149)
VLDL Cholesterol Cal: 20 mg/dL (ref 5–40)

## 2021-07-06 LAB — INSULIN, RANDOM: INSULIN: 12.6 u[IU]/mL (ref 2.6–24.9)

## 2021-07-06 LAB — HEMOGLOBIN A1C
Est. average glucose Bld gHb Est-mCnc: 120 mg/dL
Hgb A1c MFr Bld: 5.8 % — ABNORMAL HIGH (ref 4.8–5.6)

## 2021-07-06 LAB — VITAMIN D 25 HYDROXY (VIT D DEFICIENCY, FRACTURES): Vit D, 25-Hydroxy: 36.5 ng/mL (ref 30.0–100.0)

## 2021-07-06 NOTE — Telephone Encounter (Signed)
Prior authorization for Reginal Lutes was started and sent in yesterday, 07/06/21. Will notify patient and provider once response is received. Thanks!

## 2021-07-07 NOTE — Telephone Encounter (Signed)
Dr.Opalski ?

## 2021-07-10 NOTE — Progress Notes (Unsigned)
Chief Complaint:   OBESITY Sharon [name] is here to discuss her progress with her obesity treatment plan along with follow-up of her obesity related diagnoses. Sharon [name] is on keeping a food journal and adhering to recommended goals of 1300-1500 calories and 100+ grams protein and states she is following her eating plan approximately 10% of the time. Sharon [name] states she is playing tennis 30 minutes 2 times per week.  Today's visit was #: 24 Starting weight: 228 lbs Starting date: 10/30/2019 Today's weight: 205 lbs Today's date: 07/05/2021 Total lbs lost to date: 23 Total lbs lost since last in-office visit: +8  Interim History: Sharon [name] has been traveling a lot lately. She is going to [place] for 5 weeks within the next 2 months. She has been off all of her meds since her last exam. She has decreased her activity levels as well.  Subjective:   1. Impaired fasting glucose with polyphagia Sharon [name] has had no Ozempic since January and had to pay cash for it. She does not know if insurance will pay for weight loss meds, but she desires to try [facility]. Pt has no history of pancreatitis or medullary thyroid  cancer in family.  2. Gastroesophageal reflux disease, unspecified whether esophagitis present Symptoms well controlled on meds. Pt was on Protonix 40 mg prior 20 mg dose works just as well.  3. Vitamin D deficiency She was taking prescription vitamin D 50,000 IU each week. She denies nausea, vomiting or muscle weakness.  4. Other hyperlipidemia Sharon [name] has hyperlipidemia and has been trying to improve her cholesterol levels with intensive lifestyle modifications including a low saturated fat diet, exercise, and weight loss. She denies any chest pain, claudication, or myalgias.  5. At risk for impaired metabolic function Sharon [name] is at risk for impaired metabolic function due to pre-diabetes and obesity.  Assessment/Plan:   Orders Placed This Encounter  Procedures   Hemoglobin A1c   Insulin, random   VITAMIN  D 25 Hydroxy (Vit-D Deficiency, Fractures)   Lipid panel   Comprehensive metabolic panel    Medications Discontinued During This Encounter  Medication Reason   pantoprazole (PROTONIX) 20 MG tablet Reorder   Vitamin D, Ergocalciferol, (DRISDOL) 1.25 MG (50000 UNIT) CAPS capsule Reorder   Semaglutide, 2 MG/DOSE, 8 MG/3ML SOPN      Meds ordered this encounter  Medications   Vitamin D, Ergocalciferol, (DRISDOL) 1.25 MG (50000 UNIT) CAPS capsule    Sig: Take 1 capsule (50,000 Units total) by mouth every 7 (seven) days.    Dispense:  12 capsule    Refill:  0    Ov for RF   pantoprazole (PROTONIX) 20 MG tablet    Sig: Take 1 tablet (20 mg total) by mouth daily.    Dispense:  90 tablet    Refill:  0   Semaglutide-Weight Management 0.25 MG/0.5ML SOAJ    Sig: Inject 0.25 mg into the skin once a week.    Dispense:  4 mL    Refill:  0     1. Impaired fasting glucose with polyphagia Pt desires GLP-1. Risks and benefits discussed of medication discussed with pt. She will start Wegovy 0.25 mg weekly. Focus on prudent nutritional plan and exercise.   Lab/Orders for today: - Hemoglobin A1c - Insulin, random  Start- Semaglutide-Weight Management 0.25 MG/0.5ML SOAJ; Inject 0.25 mg into the skin once a week.  Dispense: 4 mL; Refill: 0  2. Gastroesophageal reflux disease, unspecified whether esophagitis present Intensive lifestyle modifications are the first line treatment  for this issue. We discussed several lifestyle modifications today and she will continue to work on diet, exercise and weight loss efforts. Orders and follow up as documented in patient record. Check CMP today.  Counseling If a person has gastroesophageal reflux disease (GERD), food and stomach acid move back up into the esophagus and cause symptoms or problems such as damage to the esophagus. Anti-reflux measures include: raising the head of the bed, avoiding tight clothing or belts, avoiding eating late at night, not lying  down shortly after mealtime, and achieving weight loss. Avoid ASA, NSAID's, caffeine, alcohol, and tobacco.  OTC Pepcid and/or Tums are often very helpful for as needed use.  However, for persisting chronic or daily symptoms, stronger medications like Omeprazole may be needed. You may need to avoid foods and drinks such as: Coffee and tea (with or without caffeine). Drinks that contain alcohol. Energy drinks and sports drinks. Bubbly (carbonated) drinks or sodas. Chocolate and cocoa. Peppermint and mint flavorings. Garlic and onions. Horseradish. Spicy and acidic foods. These include peppers, chili powder, curry powder, vinegar, hot sauces, and BBQ sauce. Citrus fruit juices and citrus fruits, such as oranges, lemons, and limes. Tomato-based foods. These include red sauce, chili, salsa, and pizza with red sauce. Fried and fatty foods. These include donuts, french fries, potato chips, and high-fat dressings. High-fat meats. These include hot dogs, rib eye steak, sausage, ham, and bacon.  Refill- pantoprazole (PROTONIX) 20 MG tablet; Take 1 tablet (20 mg total) by mouth daily.  Dispense: 90 tablet; Refill: 0  3. Vitamin D deficiency Low Vitamin D level contributes to fatigue and are associated with obesity, breast, and colon cancer. She agrees to continue to take prescription Vitamin D @50 ,000 IU every week and will follow-up for routine testing of Vitamin D, at least 2-3 times per year to avoid over-replacement.  Refill- Vitamin D, Ergocalciferol, (DRISDOL) 1.25 MG (50000 UNIT) CAPS capsule; Take 1 capsule (50,000 Units total) by mouth every 7 (seven) days.  Dispense: 12 capsule; Refill: 0  Lab/Orders for today: - VITAMIN D 25 Hydroxy (Vit-D Deficiency, Fractures)  4. Other hyperlipidemia  Diet controlled only; no need for meds.  The 10-year ASCVD risk score (Arnett DK, et al., 2019) is: 1.3% Cardiovascular risk and specific lipid/LDL goals reviewed.  We discussed several lifestyle  modifications today and Sharon [name] will continue to work on diet, exercise and weight loss efforts. Orders and follow up as documented in patient record.   Counseling Intensive lifestyle modifications are the first line treatment for this issue. Dietary changes: Increase soluble fiber. Decrease simple carbohydrates. Exercise changes: Moderate to vigorous-intensity aerobic activity 150 minutes per week if tolerated. Lipid-lowering medications: see documented in medical record.  Lab/Orders for today: - Lipid panel - Comprehensive metabolic panel  5. At risk for impaired metabolic function Due to Sharon [name]'s current state of health and medical condition(s), she is at a significantly higher risk for impaired metabolic function.  At least 10 minutes was spent on counseling Sharon [name] about these concerns today. This places the patient at a much greater risk to subsequently develop cardio-pulmonary conditions that can negatively affect the patient's quality of life. I stressed the importance of reversing these risks factors. The initial goal is to lose at least 5-10% of starting weight to help reduce risk factors. Counseling: Intensive lifestyle modifications discussed with Sharon [name] as the most appropriate first line treatment. She will continue to work on diet, exercise, and weight loss efforts. We will continue to reassess these conditions on a fairly  regular basis in an attempt to decrease the patient's overall morbidity and mortality.  6. Obesity with current BMI of 30.4 Sharon [name] is currently in the action stage of change. As such, her goal is to continue with weight loss efforts. She has agreed to change to the Category 2 Plan.   We discussed various medication options to help Gypsy with her weight loss efforts and we both agreed to start Wegovy 0.25 mg. No contraindication to use.  Start- Semaglutide-Weight Management 0.25 MG/0.5ML SOAJ; Inject 0.25 mg into the skin once a week.  Dispense: 4 mL; Refill: 0  Exercise  goals: For substantial health benefits, adults should do at least 150 minutes (2 hours and 30 minutes) a week of moderate-intensity, or 75 minutes (1 hour and 15 minutes) a week of vigorous-intensity aerobic physical activity, or an equivalent combination of moderate- and vigorous-intensity aerobic activity. Aerobic activity should be performed in episodes of at least 10 minutes, and preferably, it should be spread throughout the week.  Behavioral modification strategies: travel eating strategies, avoiding temptations, and planning for success.  Sharon [name] has agreed to follow-up with our clinic in 6 weeks. She was informed of the importance of frequent follow-up visits to maximize her success with intensive lifestyle modifications for her multiple health conditions.   Sharon [name] was informed we would discuss her lab results at her next visit unless there is a critical issue that needs to be addressed sooner. Sharon [name] agreed to keep her next visit at the agreed upon time to discuss these results.  Objective:   Blood pressure 103/67, pulse (!) 58, temperature 97.9 F (36.6 C), height 5\' 9"  (1.753 m), weight 205 lb 9.6 oz (93.3 kg), last menstrual period 03/27/2013, SpO2 97 %. Body mass index is 30.36 kg/m.  General: Cooperative, alert, well developed, in no acute distress. HEENT: Conjunctivae and lids unremarkable. Cardiovascular: Regular rhythm.  Lungs: Normal work of breathing. Neurologic: No focal deficits.   Lab Results  Component Value Date   CREATININE 0.86 07/05/2021   BUN 21 07/05/2021   NA 144 07/05/2021   K 5.1 07/05/2021   CL 103 07/05/2021   CO2 27 07/05/2021   Lab Results  Component Value Date   ALT 17 07/05/2021   AST 17 07/05/2021   ALKPHOS 68 07/05/2021   BILITOT 0.3 07/05/2021   Lab Results  Component Value Date   HGBA1C 5.8 (H) 07/05/2021   HGBA1C 5.7 (H) 01/10/2021   HGBA1C 5.7 (H) 09/02/2020   HGBA1C 5.9 (H) 02/16/2020   HGBA1C 6.1 (H) 10/30/2019   Lab Results   Component Value Date   INSULIN 12.6 07/05/2021   INSULIN 8.1 01/10/2021   INSULIN 14.4 09/02/2020   INSULIN 10.9 02/16/2020   INSULIN 20.2 10/30/2019   Lab Results  Component Value Date   TSH 1.570 01/10/2021   Lab Results  Component Value Date   CHOL 230 (H) 07/05/2021   HDL 75 07/05/2021   LDLCALC 135 (H) 07/05/2021   TRIG 115 07/05/2021   CHOLHDL 3.1 07/05/2021   Lab Results  Component Value Date   VD25OH 36.5 07/05/2021   VD25OH 54.2 01/10/2021   VD25OH 57.7 09/02/2020   Lab Results  Component Value Date   WBC 5.4 01/10/2021   HGB 13.4 01/10/2021   HCT 39.9 01/10/2021   MCV 85 01/10/2021   PLT 245 01/10/2021    Attestation Statements:   Reviewed by clinician on day of visit: allergies, medications, problem list, medical history, surgical history, family history, social history, and previous  encounter notes.  I, Kyung Rudd, BS, CMA, am acting as transcriptionist for [employer], DO.  I have reviewed the above documentation for accuracy and completeness, and I agree with the above. [name], D.O.  The 21st Century Cures Act was signed into law in 2016 which includes the topic of electronic health records.  This provides immediate access to information in MyChart.  This includes consultation notes, operative notes, office notes, lab results and pathology reports.  If you have any questions about what you read please let us know at your next visit so we can discuss your concerns and take corrective action if need be.  We are right here with you.

## 2021-07-12 ENCOUNTER — Encounter (INDEPENDENT_AMBULATORY_CARE_PROVIDER_SITE_OTHER): Payer: Self-pay

## 2021-07-12 ENCOUNTER — Telehealth (INDEPENDENT_AMBULATORY_CARE_PROVIDER_SITE_OTHER): Payer: Self-pay | Admitting: Family Medicine

## 2021-07-12 NOTE — Telephone Encounter (Signed)
Dr. Sharee Holster - Prior authorization approved for [id number]. Effective: 07/05/2021 - 11/07/2021. Patient sent approval message via mychart.

## 2021-07-12 NOTE — Telephone Encounter (Signed)
Dr. Sharee Holster - Prior authorization denied for Pantoprazole Sodium. Per insurance: This medication is covered when two over-the-counter generic proton pump inhibitors have been tried and did not work. Patient sent denial message via mychart.

## 2021-07-17 ENCOUNTER — Encounter (INDEPENDENT_AMBULATORY_CARE_PROVIDER_SITE_OTHER): Payer: Self-pay | Admitting: Family Medicine

## 2021-07-18 ENCOUNTER — Other Ambulatory Visit (HOSPITAL_COMMUNITY): Payer: Self-pay

## 2021-07-18 ENCOUNTER — Encounter (INDEPENDENT_AMBULATORY_CARE_PROVIDER_SITE_OTHER): Payer: Self-pay | Admitting: Family Medicine

## 2021-07-19 ENCOUNTER — Other Ambulatory Visit (HOSPITAL_COMMUNITY): Payer: Self-pay

## 2021-07-20 NOTE — Telephone Encounter (Signed)
Please advise 

## 2021-08-09 ENCOUNTER — Other Ambulatory Visit (HOSPITAL_COMMUNITY): Payer: Self-pay

## 2021-08-09 ENCOUNTER — Encounter (INDEPENDENT_AMBULATORY_CARE_PROVIDER_SITE_OTHER): Payer: Self-pay | Admitting: Family Medicine

## 2021-08-09 ENCOUNTER — Ambulatory Visit (INDEPENDENT_AMBULATORY_CARE_PROVIDER_SITE_OTHER): Payer: BC Managed Care – PPO | Admitting: Family Medicine

## 2021-08-09 VITALS — BP 108/71 | HR 72 | Temp 98.1°F | Ht 69.0 in | Wt 208.0 lb

## 2021-08-09 DIAGNOSIS — Z6830 Body mass index (BMI) 30.0-30.9, adult: Secondary | ICD-10-CM

## 2021-08-09 DIAGNOSIS — R7301 Impaired fasting glucose: Secondary | ICD-10-CM | POA: Diagnosis not present

## 2021-08-09 DIAGNOSIS — E559 Vitamin D deficiency, unspecified: Secondary | ICD-10-CM | POA: Diagnosis not present

## 2021-08-09 DIAGNOSIS — K219 Gastro-esophageal reflux disease without esophagitis: Secondary | ICD-10-CM

## 2021-08-09 DIAGNOSIS — E669 Obesity, unspecified: Secondary | ICD-10-CM

## 2021-08-09 DIAGNOSIS — E7849 Other hyperlipidemia: Secondary | ICD-10-CM | POA: Diagnosis not present

## 2021-08-09 DIAGNOSIS — Z9189 Other specified personal risk factors, not elsewhere classified: Secondary | ICD-10-CM

## 2021-08-09 MED ORDER — PANTOPRAZOLE SODIUM 20 MG PO TBEC: 20.0000 mg | DELAYED_RELEASE_TABLET | Freq: Every day | ORAL | 0 refills | Status: AC

## 2021-08-09 MED ORDER — OZEMPIC (0.25 OR 0.5 MG/DOSE) 2 MG/3ML ~~LOC~~ SOPN
PEN_INJECTOR | SUBCUTANEOUS | 0 refills | Status: DC
  Filled 2021-08-09: qty 3, 56d supply, fill #0
  Filled 2021-08-17: qty 3, 35d supply, fill #0

## 2021-08-09 MED ORDER — VITAMIN D (ERGOCALCIFEROL) 1.25 MG (50000 UNIT) PO CAPS: 50000.0000 [IU] | ORAL_CAPSULE | ORAL | 0 refills | Status: DC

## 2021-08-09 NOTE — Patient Instructions (Signed)
The 10-year ASCVD risk score (Arnett DK, et al., 2019) is: 1.5%   Values used to calculate the score:     Age: 56 years     Sex: Female     Is Non-Hispanic African American: Yes     Diabetic: No     Tobacco smoker: No     Systolic Blood Pressure: 108 mmHg     Is BP treated: No     HDL Cholesterol: 75 mg/dL     Total Cholesterol: 230 mg/dL

## 2021-08-11 ENCOUNTER — Telehealth (INDEPENDENT_AMBULATORY_CARE_PROVIDER_SITE_OTHER): Payer: Self-pay | Admitting: Family Medicine

## 2021-08-11 ENCOUNTER — Encounter (INDEPENDENT_AMBULATORY_CARE_PROVIDER_SITE_OTHER): Payer: Self-pay

## 2021-08-11 NOTE — Telephone Encounter (Signed)
Dr. Sharee Holster - Prior authorization approved for Ozempic. Effective: 08/09/2021 - 08/08/2022. Patient sent approval message via mychart.

## 2021-08-11 NOTE — Telephone Encounter (Signed)
Dr. Opalski - Prior authorization denied for Pantoprazole Sodium. Per insurance: This medication is covered when two over-the-counter generic proton pump inhibitors have been tried and did not work. Patient sent denial message via mychart.  

## 2021-08-14 DIAGNOSIS — R7301 Impaired fasting glucose: Secondary | ICD-10-CM | POA: Insufficient documentation

## 2021-08-14 DIAGNOSIS — Z9189 Other specified personal risk factors, not elsewhere classified: Secondary | ICD-10-CM | POA: Insufficient documentation

## 2021-08-14 NOTE — Progress Notes (Signed)
Chief Complaint:   OBESITY Sharon [name] is here to discuss her progress with her obesity treatment plan along with follow-up of her obesity related diagnoses. Sharon [name] is on the Category 2 Plan and states she is following her eating plan approximately 0% of the time. Sharon [name] states she is not exercising.  Today's visit was #: 25 Starting weight: 228 lbs Starting date: 10/30/2019 Today's weight: 208 lbs Today's date: 08/09/2021 Total lbs lost to date: 20 lbs Total lbs lost since last in-office visit: +3  Interim History: Sharon [name] has been traveling a lot lately, she just came back from [place], [place] and [place].  Going away again tomorrow with travel for her twins to find a college.  She has been eating out a lot.  Here to to review labs drawn at last office visit.   Subjective:   1. Vitamin D deficiency Discussed labs with patient today. She is currently taking prescription vitamin D 50,000 IU each week. She denies nausea, vomiting or muscle weakness.  2. Other hyperlipidemia Discussed labs with patient today. Medication(s) reviewed. Patient denies myalgias.  The 10-year ASCVD risk score (Arnett DK, et al., 2019) is: 1.5%   Values used to calculate the score:     Age: 77 years     Sex: Female     Is Non-Hispanic African American: Yes     Diabetic: No     Tobacco smoker: No     Systolic Blood Pressure: 108 mmHg     Is BP treated: No     HDL Cholesterol: 75 mg/dL     Total Cholesterol: 230 mg/dL   3. Impaired fasting glucose with polyphagia Discussed labs with patient today. Sharon [name] has not been on any GLP-1's for many months now. She has been unable to find Wegovy in stock due to [employer], she desires to change medication.  4. Gastroesophageal reflux disease, unspecified whether esophagitis present Symptoms controlled on current regimen.  She has been experiencing GERD. ROS: patient denies symptoms.  5. At risk for diabetes mellitus Sharon [name] is at higher than average risk for  developing diabetes due to her obesity, prediabetes, and insulin resistance.     Assessment/Plan:  No orders of the defined types were placed in this encounter.   Medications Discontinued During This Encounter  Medication Reason   Semaglutide-Weight Management 0.25 MG/0.5ML SOAJ    Vitamin D, Ergocalciferol, (DRISDOL) 1.25 MG (50000 UNIT) CAPS capsule Reorder   pantoprazole (PROTONIX) 20 MG tablet Reorder     Meds ordered this encounter  Medications   pantoprazole (PROTONIX) 20 MG tablet    Sig: Take 1 tablet (20 mg total) by mouth daily.    Dispense:  30 tablet    Refill:  0   Vitamin D, Ergocalciferol, (DRISDOL) 1.25 MG (50000 UNIT) CAPS capsule    Sig: Take 1 capsule (50,000 Units total) by mouth every 7 (seven) days.    Dispense:  4 capsule    Refill:  0    Ov for RF   Semaglutide,0.25 or 0.5MG /DOS, (OZEMPIC, 0.25 OR 0.5 MG/DOSE,) 2 MG/3ML SOPN    Sig: Inject 0.25 mg into the skin once a week on Thursday for 14 days, THEN 0.5 mg once a week on Thursday    Dispense:  3 mL    Refill:  0     1. Vitamin D deficiency Low Vitamin D level contributes to fatigue and are associated with obesity, breast, and colon cancer. She agrees to continue to take prescription Vitamin D @50 ,000  IU every week and will follow-up for routine testing of Vitamin D, at least 2-3 times per year to avoid over-replacement. Vitamin D is not at goal, however, she has been traveling a lot and has not been consistent with taking her weekly Ergocalciferol.  Refill - Vitamin D, Ergocalciferol, (DRISDOL) 1.25 MG (50000 UNIT) CAPS capsule; Take 1 capsule (50,000 Units total) by mouth every 7 (seven) days.  Dispense: 4 capsule; Refill: 0  2. Other hyperlipidemia Lanelle's LDL is worsening but HDL has improved.  No needs for medications. The 10-year ASCVD risk score (Arnett DK, et al., 2019) is: 1.5%   Values used to calculate the score:     Age: 56 years     Sex: Female     Is Non-Hispanic African American:  Yes     Diabetic: No     Tobacco smoker: No     Systolic Blood Pressure: 108 mmHg     Is BP treated: No     HDL Cholesterol: 75 mg/dL     Total Cholesterol: 230 mg/dL  3. Impaired fasting glucose with polyphagia Sharon [name]'s A1c and fasting insulin are stable.  Discontinue Wegovy and Start Ozempic.  See below.  Start - Semaglutide,0.25 or 0.5MG /DOS, (OZEMPIC, 0.25 OR 0.5 MG/DOSE,) 2 MG/3ML SOPN; Inject 0.25 mg into the skin once a week on Thursday for 14 days, THEN 0.5 mg once a week on Thursday  Dispense: 3 mL; Refill: 0  4. Gastroesophageal reflux disease, unspecified whether esophagitis present Intensive lifestyle modifications are the first line treatment for this issue. We discussed several lifestyle modifications today and she will continue to work on diet, exercise and weight loss efforts. Orders and follow up as documented in patient record.   Counseling If a person has gastroesophageal reflux disease (GERD), food and stomach acid move back up into the esophagus and cause symptoms or problems such as damage to the esophagus. Anti-reflux measures include: raising the head of the bed, avoiding tight clothing or belts, avoiding eating late at night, not lying down shortly after mealtime, and achieving weight loss. Avoid ASA, NSAID's, caffeine, alcohol, and tobacco.  OTC Pepcid and/or Tums are often very helpful for as needed use.  However, for persisting chronic or daily symptoms, stronger medications like Omeprazole may be needed. You may need to avoid foods and drinks such as: Coffee and tea (with or without caffeine). Drinks that contain alcohol. Energy drinks and sports drinks. Bubbly (carbonated) drinks or sodas. Chocolate and cocoa. Peppermint and mint flavorings. Garlic and onions. Horseradish. Spicy and acidic foods. These include peppers, chili powder, curry powder, vinegar, hot sauces, and BBQ sauce. Citrus fruit juices and citrus fruits, such as oranges, lemons, and  limes. Tomato-based foods. These include red sauce, chili, salsa, and pizza with red sauce. Fried and fatty foods. These include donuts, french fries, potato chips, and high-fat dressings. High-fat meats. These include hot dogs, rib eye steak, sausage, ham, and bacon.  Refill - pantoprazole (PROTONIX) 20 MG tablet; Take 1 tablet (20 mg total) by mouth daily.  Dispense: 30 tablet; Refill: 0  5. At risk for diabetes mellitus - Vaniah was given diabetes prevention education and counseling today of more than 9 minutes.  - Counseled patient on pathophysiology of disease and meaning/ implication of lab results.  - Reviewed how certain foods can either stimulate or inhibit insulin release, and subsequently affect hunger pathways  - Importance of following a healthy meal plan with limiting amounts of simple carbohydrates discussed with patient - Effects of  regular aerobic exercise on blood sugar regulation reviewed and encouraged an eventual goal of 30 min 5d/week or more as a minimum.  - Briefly discussed treatment options, which always include dietary and lifestyle modification as first line.   - Handouts provided at patient's desire and/or told to go online to the American Diabetes Association website for further information.   6. Obesity with current BMI of 30.8 Sharon [name] is currently in the action stage of change. As such, her goal is to continue with weight loss efforts. She has agreed to the Category 2 Plan.   Exercise goals:  As is.  Behavioral modification strategies: increasing lean protein intake and decreasing simple carbohydrates.  Sharon [name] has agreed to follow-up with our clinic in 4 weeks. She was informed of the importance of frequent follow-up visits to maximize her success with intensive lifestyle modifications for her multiple health conditions.   Objective:   Blood pressure 108/71, pulse 72, temperature 98.1 F (36.7 C), height 5\' 9"  (1.753 m), weight 208 lb (94.3 kg), last menstrual  period 03/27/2013, SpO2 98 %. Body mass index is 30.72 kg/m.  General: Cooperative, alert, well developed, in no acute distress. HEENT: Conjunctivae and lids unremarkable. Cardiovascular: Regular rhythm.  Lungs: Normal work of breathing. Neurologic: No focal deficits.   Lab Results  Component Value Date   CREATININE 0.86 07/05/2021   BUN 21 07/05/2021   NA 144 07/05/2021   K 5.1 07/05/2021   CL 103 07/05/2021   CO2 27 07/05/2021   Lab Results  Component Value Date   ALT 17 07/05/2021   AST 17 07/05/2021   ALKPHOS 68 07/05/2021   BILITOT 0.3 07/05/2021   Lab Results  Component Value Date   HGBA1C 5.8 (H) 07/05/2021   HGBA1C 5.7 (H) 01/10/2021   HGBA1C 5.7 (H) 09/02/2020   HGBA1C 5.9 (H) 02/16/2020   HGBA1C 6.1 (H) 10/30/2019   Lab Results  Component Value Date   INSULIN 12.6 07/05/2021   INSULIN 8.1 01/10/2021   INSULIN 14.4 09/02/2020   INSULIN 10.9 02/16/2020   INSULIN 20.2 10/30/2019   Lab Results  Component Value Date   TSH 1.570 01/10/2021   Lab Results  Component Value Date   CHOL 230 (H) 07/05/2021   HDL 75 07/05/2021   LDLCALC 135 (H) 07/05/2021   TRIG 115 07/05/2021   CHOLHDL 3.1 07/05/2021   Lab Results  Component Value Date   VD25OH 36.5 07/05/2021   VD25OH 54.2 01/10/2021   VD25OH 57.7 09/02/2020   Lab Results  Component Value Date   WBC 5.4 01/10/2021   HGB 13.4 01/10/2021   HCT 39.9 01/10/2021   MCV 85 01/10/2021   PLT 245 01/10/2021   No results found for: "IRON", "TIBC", "FERRITIN"  Attestation Statements:   Reviewed by clinician on day of visit: allergies, medications, problem list, medical history, surgical history, family history, social history, and previous encounter notes.  I, 01/12/2021, RMA, am acting as [employer] for Energy manager, DO.   I have reviewed the above documentation for accuracy and completeness, and I agree with the above. [employer], D.O.  The 21st Century Cures Act was signed into  law in 2016 which includes the topic of electronic health records.  This provides immediate access to information in MyChart.  This includes consultation notes, operative notes, office notes, lab results and pathology reports.  If you have any questions about what you read please let 2017 know at your next visit so we can discuss your concerns and  take corrective action if need be.  We are right here with you.

## 2021-08-17 ENCOUNTER — Other Ambulatory Visit (HOSPITAL_COMMUNITY): Payer: Self-pay

## 2021-08-25 ENCOUNTER — Ambulatory Visit (INDEPENDENT_AMBULATORY_CARE_PROVIDER_SITE_OTHER): Payer: BC Managed Care – PPO | Admitting: Sports Medicine

## 2021-08-25 ENCOUNTER — Other Ambulatory Visit (HOSPITAL_COMMUNITY): Payer: Self-pay

## 2021-08-25 VITALS — BP 106/76 | Ht 69.0 in | Wt 207.0 lb

## 2021-08-25 DIAGNOSIS — M79605 Pain in left leg: Secondary | ICD-10-CM

## 2021-08-25 DIAGNOSIS — R202 Paresthesia of skin: Secondary | ICD-10-CM | POA: Diagnosis not present

## 2021-08-25 DIAGNOSIS — R159 Full incontinence of feces: Secondary | ICD-10-CM | POA: Diagnosis not present

## 2021-08-25 DIAGNOSIS — M1611 Unilateral primary osteoarthritis, right hip: Secondary | ICD-10-CM

## 2021-08-25 DIAGNOSIS — M5136 Other intervertebral disc degeneration, lumbar region: Secondary | ICD-10-CM

## 2021-08-25 DIAGNOSIS — R200 Anesthesia of skin: Secondary | ICD-10-CM

## 2021-08-25 DIAGNOSIS — M545 Low back pain, unspecified: Secondary | ICD-10-CM

## 2021-08-25 MED ORDER — DIAZEPAM 5 MG PO TABS
5.0000 mg | ORAL_TABLET | Freq: Four times a day (QID) | ORAL | 0 refills | Status: DC | PRN
  Filled 2021-08-25: qty 2, 1d supply, fill #0

## 2021-08-25 NOTE — Progress Notes (Signed)
PCP: Daisy Floro, MD  Subjective:   HPI: Patient is a 56 y.o. female here for numbness of bilateral foot and perineum .  Patient describes new onset numbness and tingling in her perineum and bilateral feet which started about 10 days ago. She has known history of lumbar disc degeneration, but these symptoms are new. She also has new onset fecal incontinence. Her feet numbness has been rising up to her ankles over the last couple days. She describes the feeling of "sand in her feet" when she moves her toes.   Review of systems No significant back pain No classic sciatic symptoms No weakness or foot drop noted   Past Medical History:  Diagnosis Date   Anemia    Back pain    Exertional asthma    Female infertility    GERD (gastroesophageal reflux disease)    High cholesterol    Right leg pain    Vitamin D deficiency     Current Outpatient Medications on File Prior to Visit  Medication Sig Dispense Refill   amitriptyline (ELAVIL) 25 MG tablet Take 1 tablet (25 mg total) by mouth at bedtime as needed for sleep. 30 tablet 0   aspirin 325 MG tablet Take 325 mg by mouth at bedtime.     co-enzyme Q-10 30 MG capsule Take 30 mg by mouth at bedtime.     ibuprofen (ADVIL) 200 MG tablet Take 400 mg by mouth every 6 (six) hours as needed for moderate pain.     meloxicam (MOBIC) 15 MG tablet TAKE 1 TABLET BY MOUTH EVERY DAY AS NEEDED FOR PAIN 30 tablet 3   nitroGLYCERIN (NITRODUR - DOSED IN MG/24 HR) 0.2 mg/hr patch Use 1/4 patch daily to the affected area. (Patient taking differently: Place 0.05 mg onto the skin as needed (joint pain).) 30 patch 1   pantoprazole (PROTONIX) 20 MG tablet Take 1 tablet (20 mg total) by mouth daily. 30 tablet 0   Semaglutide,0.25 or 0.5MG /DOS, (OZEMPIC, 0.25 OR 0.5 MG/DOSE,) 2 MG/3ML SOPN Inject 0.25 mg into the skin once a week on Thursday for 14 days, THEN 0.5 mg once a week on Thursday 3 mL 0   Vitamin D, Ergocalciferol, (DRISDOL) 1.25 MG (50000 UNIT)  CAPS capsule Take 1 capsule (50,000 Units total) by mouth every 7 (seven) days. 4 capsule 0   No current facility-administered medications on file prior to visit.    Past Surgical History:  Procedure Laterality Date   CESAREAN SECTION     LAPAROSCOPY      No Known Allergies  BP 106/76   Ht 5\' 9"  (1.753 m)   Wt 207 lb (93.9 kg)   LMP 03/27/2013   BMI 30.57 kg/m      12/16/2019   11:43 AM 12/30/2020    3:19 PM  [facility] Adult [facility]  Frequency of aerobic exercise (# of days/week) 5 5  Average time in minutes 45 90  Frequency of strengthening activities (# of days/week) 5 3        No data to display              Objective:  Physical Exam:  Gen: NAD, comfortable in exam room Back: No obvious deformity. No TTP over the midline or bony surfaces. No TTP over SI joint bilaterally or piriformis. Negative straight leg raise. Negative FABER/FADIR. Mild discomfort with flexion and extension. Knee and ankle DTR 2+. 5/5 strength throughout. Decreased sensation over her bilateral feet up to the distal third of  the shin.    Assessment & Plan:  1. New onset paresthesias concerning for progressive lumbar disc disease vs central disc disease. No weakness or motor dysfunction, which is reassuring. But given her fecal incontinence and perineal numbness we ordered rushed MRI for further evaluation and were able to schedule her for one tomorrow. Will follow up after MRI.  Timoteo Expose, MS4 I observed and examined the patient with the medical student and agree with assessment and plan.  Note reviewed and modified by me. Sterling Big, MD

## 2021-08-26 ENCOUNTER — Ambulatory Visit (HOSPITAL_COMMUNITY)
Admission: RE | Admit: 2021-08-26 | Discharge: 2021-08-26 | Disposition: A | Discharge status: Home / Self Care | Payer: BC Managed Care – PPO | Source: Ambulatory Visit | Attending: Sports Medicine | Admitting: Sports Medicine

## 2021-08-26 DIAGNOSIS — M79605 Pain in left leg: Secondary | ICD-10-CM | POA: Diagnosis not present

## 2021-08-26 DIAGNOSIS — M5416 Radiculopathy, lumbar region: Secondary | ICD-10-CM | POA: Diagnosis not present

## 2021-08-26 DIAGNOSIS — R200 Anesthesia of skin: Secondary | ICD-10-CM

## 2021-08-26 DIAGNOSIS — R202 Paresthesia of skin: Secondary | ICD-10-CM

## 2021-08-26 DIAGNOSIS — M545 Low back pain, unspecified: Secondary | ICD-10-CM

## 2021-08-26 DIAGNOSIS — R159 Full incontinence of feces: Secondary | ICD-10-CM

## 2021-08-26 NOTE — Assessment & Plan Note (Signed)
She has a known degenerative disc issue in L4-5 and L5-S1 from prior MRI  With her current symptoms I am worried that we must rule out a central disc causing peroneal dysfunction and a lot of sensory change  Stat MRI ordered and follow-up once that is completed

## 2021-08-27 ENCOUNTER — Other Ambulatory Visit: Payer: Self-pay | Admitting: Sports Medicine

## 2021-08-29 NOTE — Patient Instructions (Signed)
[facility] Dr Maisie Fus Friday 8.11.23 @ 8a [address], [place], [place] [address] [phone]

## 2021-08-31 ENCOUNTER — Encounter (INDEPENDENT_AMBULATORY_CARE_PROVIDER_SITE_OTHER): Payer: Self-pay

## 2021-09-02 DIAGNOSIS — R200 Anesthesia of skin: Secondary | ICD-10-CM | POA: Diagnosis not present

## 2021-09-02 DIAGNOSIS — R202 Paresthesia of skin: Secondary | ICD-10-CM | POA: Diagnosis not present

## 2021-09-02 DIAGNOSIS — Z6831 Body mass index (BMI) 31.0-31.9, adult: Secondary | ICD-10-CM | POA: Diagnosis not present

## 2021-09-07 ENCOUNTER — Other Ambulatory Visit (HOSPITAL_COMMUNITY): Payer: Self-pay

## 2021-09-07 ENCOUNTER — Ambulatory Visit (INDEPENDENT_AMBULATORY_CARE_PROVIDER_SITE_OTHER): Payer: BC Managed Care – PPO | Admitting: Family Medicine

## 2021-09-07 ENCOUNTER — Encounter (INDEPENDENT_AMBULATORY_CARE_PROVIDER_SITE_OTHER): Payer: Self-pay | Admitting: Family Medicine

## 2021-09-07 VITALS — BP 110/76 | HR 72 | Temp 98.5°F | Ht 69.0 in | Wt 205.0 lb

## 2021-09-07 DIAGNOSIS — R7301 Impaired fasting glucose: Secondary | ICD-10-CM | POA: Diagnosis not present

## 2021-09-07 DIAGNOSIS — R632 Polyphagia: Secondary | ICD-10-CM | POA: Diagnosis not present

## 2021-09-07 DIAGNOSIS — Z9189 Other specified personal risk factors, not elsewhere classified: Secondary | ICD-10-CM

## 2021-09-07 DIAGNOSIS — E669 Obesity, unspecified: Secondary | ICD-10-CM | POA: Diagnosis not present

## 2021-09-07 DIAGNOSIS — E559 Vitamin D deficiency, unspecified: Secondary | ICD-10-CM

## 2021-09-07 DIAGNOSIS — Z6833 Body mass index (BMI) 33.0-33.9, adult: Secondary | ICD-10-CM

## 2021-09-07 DIAGNOSIS — Z6830 Body mass index (BMI) 30.0-30.9, adult: Secondary | ICD-10-CM

## 2021-09-07 MED ORDER — SEMAGLUTIDE (1 MG/DOSE) 4 MG/3ML ~~LOC~~ SOPN
1.0000 mg | PEN_INJECTOR | SUBCUTANEOUS | 0 refills | Status: DC
  Filled 2021-09-07: qty 3, 28d supply, fill #0

## 2021-09-13 NOTE — Progress Notes (Signed)
Chief Complaint:   OBESITY Sharon [name] is here to discuss her progress with her obesity treatment plan along with follow-up of her obesity related diagnoses. Sharon [name] is on the Category 2 Plan and states she is following her eating plan approximately 0% of the time. Sharon [name] states she is not currently exercising.  Today's visit was #: 26 Starting weight: 228 lbs Starting date: 10/30/2019 Today's weight: 205 lbs Today's date: 09/07/2021 Total lbs lost to date: 23 Total lbs lost since last in-office visit: 3  Interim History: Sharon [name] has been traveling all around the country with her kids. She has neuropathy with numbness bilateral feet that developed while away in [place]. Pt saw sports med provider about it and they are treating her with Neurontin. She is unable to exercise.  Subjective:   1. Impaired fasting glucose with polyphagia Pr doesn't desire increased dose of Ozempic to help with hunger and cravings. She is tolerating meds well without side effects.  2. Vitamin D deficiency She is currently taking prescription vitamin D 50,000 IU each week. She denies nausea, vomiting or muscle weakness.  3. At risk for activity intolerance Sharon [name] is at risk for exercise intolerance due to bilateral foot neuropathy.  Assessment/Plan:  No orders of the defined types were placed in this encounter.   Medications Discontinued During This Encounter  Medication Reason   Semaglutide,0.25 or 0.5MG /DOS, (OZEMPIC, 0.25 OR 0.5 MG/DOSE,) 2 MG/3ML SOPN      Meds ordered this encounter  Medications   Semaglutide, 1 MG/DOSE, 4 MG/3ML SOPN    Sig: Inject 1 mg as directed once a week.    Dispense:  3 mL    Refill:  0     1. Impaired fasting glucose with polyphagia Intensive lifestyle modifications are the first line treatment for this issue. We discussed several lifestyle modifications today and she will continue to work on diet, exercise and weight loss efforts. Orders and follow up as documented in patient  record. Discontinue Ozempic 0.5 mg dose.  Counseling Polyphagia is excessive hunger. Causes can include: low blood sugars, hypERthyroidism, PMS, lack of sleep, stress, insulin resistance, diabetes, certain medications, and diets that are deficient in protein and fiber.   Increase & Refill- Semaglutide, 1 MG/DOSE, 4 MG/3ML SOPN; Inject 1 mg as directed once a week.  Dispense: 3 mL; Refill: 0  2. Vitamin D deficiency Low Vitamin D level contributes to fatigue and are associated with obesity, breast, and colon cancer. She agrees to continue to take prescription Vitamin D @50 ,000 IU every week and will follow-up for routine testing of Vitamin D, at least 2-3 times per year to avoid over-replacement. Pt denies need for refill at this time.  3. At risk for activity intolerance Sharon [name] was given approximately 9 minutes of counseling today regarding her increased risk for exercise intolerance.  We discussed patient's specific personal and medical issues that raise our concern.  She was advised of strategies to prevent injury and ways to improve her cardiopulmonary fitness levels slowly over time.  We additionally discussed various fitness trackers and smart phone apps to help motivate the patient to stay on track. Counseling done.  4. Obesity with current BMI of 30.3 Sharon [name] is currently in the action stage of change. As such, her goal is to continue with weight loss efforts. She has agreed to the Category 2 Plan.   Exercise goals:  As is  Behavioral modification strategies: decreasing simple carbohydrates and planning for success.  Sharon [name] has agreed to follow-up with  our clinic in 4 weeks. She was informed of the importance of frequent follow-up visits to maximize her success with intensive lifestyle modifications for her multiple health conditions.   Objective:   Blood pressure 110/76, pulse 72, temperature 98.5 F (36.9 C), height 5\' 9"  (1.753 m), weight 205 lb (93 kg), last menstrual period 03/27/2013,  SpO2 98 %. Body mass index is 30.27 kg/m.  General: Cooperative, alert, well developed, in no acute distress. HEENT: Conjunctivae and lids unremarkable. Cardiovascular: Regular rhythm.  Lungs: Normal work of breathing. Neurologic: No focal deficits.   Lab Results  Component Value Date   CREATININE 0.86 07/05/2021   BUN 21 07/05/2021   NA 144 07/05/2021   K 5.1 07/05/2021   CL 103 07/05/2021   CO2 27 07/05/2021   Lab Results  Component Value Date   ALT 17 07/05/2021   AST 17 07/05/2021   ALKPHOS 68 07/05/2021   BILITOT 0.3 07/05/2021   Lab Results  Component Value Date   HGBA1C 5.8 (H) 07/05/2021   HGBA1C 5.7 (H) 01/10/2021   HGBA1C 5.7 (H) 09/02/2020   HGBA1C 5.9 (H) 02/16/2020   HGBA1C 6.1 (H) 10/30/2019   Lab Results  Component Value Date   INSULIN 12.6 07/05/2021   INSULIN 8.1 01/10/2021   INSULIN 14.4 09/02/2020   INSULIN 10.9 02/16/2020   INSULIN 20.2 10/30/2019   Lab Results  Component Value Date   TSH 1.570 01/10/2021   Lab Results  Component Value Date   CHOL 230 (H) 07/05/2021   HDL 75 07/05/2021   LDLCALC 135 (H) 07/05/2021   TRIG 115 07/05/2021   CHOLHDL 3.1 07/05/2021   Lab Results  Component Value Date   VD25OH 36.5 07/05/2021   VD25OH 54.2 01/10/2021   VD25OH 57.7 09/02/2020   Lab Results  Component Value Date   WBC 5.4 01/10/2021   HGB 13.4 01/10/2021   HCT 39.9 01/10/2021   MCV 85 01/10/2021   PLT 245 01/10/2021   Attestation Statements:   Reviewed by clinician on day of visit: allergies, medications, problem list, medical history, surgical history, family history, social history, and previous encounter notes.  I, 01/12/2021, BS, CMA, am acting as transcriptionist for Kyung Rudd, DO.   I have reviewed the above documentation for accuracy and completeness, and I agree with the above. [employer], D.O.  The 21st Century Cures Act was signed into law in 2016 which includes the topic of electronic health records.   This provides immediate access to information in MyChart.  This includes consultation notes, operative notes, office notes, lab results and pathology reports.  If you have any questions about what you read please let 2017 know at your next visit so we can discuss your concerns and take corrective action if need be.  We are right here with you.

## 2021-10-03 ENCOUNTER — Encounter: Payer: Self-pay | Admitting: Neurology

## 2021-10-03 ENCOUNTER — Ambulatory Visit (INDEPENDENT_AMBULATORY_CARE_PROVIDER_SITE_OTHER): Payer: BC Managed Care – PPO | Admitting: Neurology

## 2021-10-03 VITALS — BP 123/85 | HR 66 | Ht 68.5 in | Wt 209.0 lb

## 2021-10-03 DIAGNOSIS — R202 Paresthesia of skin: Secondary | ICD-10-CM

## 2021-10-03 DIAGNOSIS — R159 Full incontinence of feces: Secondary | ICD-10-CM | POA: Diagnosis not present

## 2021-10-03 NOTE — Progress Notes (Signed)
Chief Complaint  Patient presents with   New Patient (Initial Visit)    Rm 13 alone here for consult on bilateral foot numbness. Pt reports back in July she woke up with numbness in bilateral feet. Pt reports since then sx have improved some but sx have improved some since July but sensation is still off. Right foot is more noticeable than the left.         ASSESSMENT AND PLAN  Sharon [name] is a 56 y.o. female   Acute onset of bilateral toes paresthesia, intermittent bowel incontinence  Brisk reflex on examinations,  MRI of lumbar spine without contrast August 26, 2021 multilevel degenerative disc disease, most noticeable at L5-S1, moderate bilateral foraminal stenosis, no significant canal stenosis  The brisk reflex worrisome for cervical/thoracic pathology.  With superimposed peripheral neuropathy versus lumbar radiculopathy  MRI cervical and thoracic spine EMG nerve conduction study   DIAGNOSTIC DATA (LABS, IMAGING, TESTING) - I reviewed patient records, labs, notes, testing and imaging myself where available.   MEDICAL HISTORY:  Sharon [name] is a 56 year old female, seen in request by neurosurgeon Dr. Hoyt Koch for evaluation of toes paresthesia  I reviewed and summarized the referring note. PMhx. Pre-DM  She has been physically active, plays tennis regularly, noticed some right hip pain,  On August 16, 2021, while traveling outside, she woke up noticed bilateral toes cold sensation, as if they were in the snow, then she realized they were not, heavy, swollen sensation, also noticed perineal numbness when she wiped herself, she felt there was a tissue start there, she also had an episode of bowel incontinence, her perineal area abnormal sensation last until  August 30, 2016 2023, she denies recurrent bowel and bladder incontinence  She still have abnormal sensation at her toes, there is something stuck in the ball of her feet, she has some gait abnormality,  attributed to her right hip pain, denies bilateral hands paresthesia    Personally reviewed MRI of lumbar August 26, 2021, multilevel degenerative changes, most noticeable at L5-S1, moderate foraminal stenosis bilaterally, PHYSICAL EXAM:   Vitals:   10/03/21 1455  BP: 123/85  Pulse: 66  Weight: 209 lb (94.8 kg)  Height: 5' 8.5" (1.74 m)   Not recorded     Body mass index is 31.32 kg/m.  PHYSICAL EXAMNIATION:  Gen: NAD, conversant, well nourised, well groomed                     Cardiovascular: Regular rate rhythm, no peripheral edema, warm, nontender. Eyes: Conjunctivae clear without exudates or hemorrhage Neck: Supple, no carotid bruits. Pulmonary: Clear to auscultation bilaterally   NEUROLOGICAL EXAM:  MENTAL STATUS: Speech/cognition: Awake, alert, oriented to history taking and casual conversation CRANIAL NERVES: CN II: Visual fields are full to confrontation. Pupils are round equal and briskly reactive to light. CN III, IV, VI: extraocular movement are normal. No ptosis. CN V: Facial sensation is intact to light touch CN VII: Face is symmetric with normal eye closure  CN VIII: Hearing is normal to causal conversation. CN IX, X: Phonation is normal. CN XI: Head turning and shoulder shrug are intact  MOTOR: There is no pronator drift of out-stretched arms. Muscle bulk and tone are normal. Muscle strength is normal.  REFLEXES: Reflexes are 1  and symmetric at the biceps, triceps, 2/2 knees, and ankles. Plantar responses are flexor.  SENSORY: Intact to light touch, pinprick and vibratory sensation are intact in fingers and toes.  COORDINATION:  There is no trunk or limb dysmetria noted.  GAIT/STANCE: Mildly antalgic due to right hip pain  REVIEW OF SYSTEMS:  Full 14 system review of systems performed and notable only for as above All other review of systems were negative.   ALLERGIES: No Known Allergies  HOME MEDICATIONS: Current Outpatient Medications   Medication Sig Dispense Refill   amitriptyline (ELAVIL) 25 MG tablet Take 1 tablet (25 mg total) by mouth at bedtime as needed for sleep. 30 tablet 0   aspirin 325 MG tablet Take 325 mg by mouth at bedtime.     diazepam (VALIUM) 5 MG tablet Take 1 tablet (5 mg total) by mouth every 6 (six) hours as needed for anxiety. 2 tablet 0   gabapentin (NEURONTIN) 300 MG capsule Take 300 mg by mouth 3 (three) times daily.     ibuprofen (ADVIL) 200 MG tablet Take 400 mg by mouth every 6 (six) hours as needed for moderate pain.     pantoprazole (PROTONIX) 20 MG tablet Take 1 tablet (20 mg total) by mouth daily. 30 tablet 0   Semaglutide, 1 MG/DOSE, 4 MG/3ML SOPN Inject 1 mg as directed once a week. 3 mL 0   Vitamin D, Ergocalciferol, (DRISDOL) 1.25 MG (50000 UNIT) CAPS capsule Take 1 capsule (50,000 Units total) by mouth every 7 (seven) days. 4 capsule 0   No current facility-administered medications for this visit.    PAST MEDICAL HISTORY: Past Medical History:  Diagnosis Date   Anemia    Back pain    Exertional asthma    Female infertility    GERD (gastroesophageal reflux disease)    High cholesterol    Right leg pain    Vitamin D deficiency     PAST SURGICAL HISTORY: Past Surgical History:  Procedure Laterality Date   CESAREAN SECTION     LAPAROSCOPY      FAMILY HISTORY: Family History  Problem Relation Age of Onset   Diabetes Father    Lung cancer Father    Cancer Father    High Cholesterol Mother     SOCIAL HISTORY: Social History   Socioeconomic History   Marital status: Married    Spouse name: Alden Server   Number of children: 3   Years of education: 16   Highest education level: Not on file  Occupational History   Occupation: Homemaker  Tobacco Use   Smoking status: Never   Smokeless tobacco: Never  Substance and Sexual Activity   Alcohol use: Yes    Comment: rare, social   Drug use: No   Sexual activity: Not on file  Other Topics Concern   Not on file  Social  History Narrative   Patient is married Alden Server) and lives at home with her family.   Patient has three children.   Patient is a homemaker.   Patient does drink caffeine maybe two cups per week.   Patient is right-handed.   Patient has a BS degree.            Social Determinants of Health   Financial Resource Strain: Not on file  Food Insecurity: Not on file  Transportation Needs: Not on file  Physical Activity: Not on file  Stress: Not on file  Social Connections: Not on file  Intimate Partner Violence: Not on file      Levert Feinstein, M.D. Ph.D.  [facility] [address] [place], [place] [address] Ph: [phone] Fax: [phone]  CC:  Bedelia Person, MD [address] [place],  [place] [address]  Lawerance Cruel, MD

## 2021-10-05 ENCOUNTER — Ambulatory Visit (INDEPENDENT_AMBULATORY_CARE_PROVIDER_SITE_OTHER): Payer: BC Managed Care – PPO | Admitting: Family Medicine

## 2021-10-05 ENCOUNTER — Other Ambulatory Visit (HOSPITAL_COMMUNITY): Payer: Self-pay

## 2021-10-05 ENCOUNTER — Encounter (INDEPENDENT_AMBULATORY_CARE_PROVIDER_SITE_OTHER): Payer: Self-pay | Admitting: Family Medicine

## 2021-10-05 ENCOUNTER — Telehealth: Payer: Self-pay | Admitting: Neurology

## 2021-10-05 VITALS — BP 105/75 | HR 65 | Temp 98.1°F | Ht 69.0 in | Wt 206.0 lb

## 2021-10-05 DIAGNOSIS — R7301 Impaired fasting glucose: Secondary | ICD-10-CM

## 2021-10-05 DIAGNOSIS — Z6830 Body mass index (BMI) 30.0-30.9, adult: Secondary | ICD-10-CM | POA: Diagnosis not present

## 2021-10-05 DIAGNOSIS — E66811 Obesity, class 1: Secondary | ICD-10-CM

## 2021-10-05 DIAGNOSIS — E559 Vitamin D deficiency, unspecified: Secondary | ICD-10-CM | POA: Diagnosis not present

## 2021-10-05 DIAGNOSIS — Z9189 Other specified personal risk factors, not elsewhere classified: Secondary | ICD-10-CM

## 2021-10-05 DIAGNOSIS — E669 Obesity, unspecified: Secondary | ICD-10-CM

## 2021-10-05 MED ORDER — DIAZEPAM 5 MG PO TABS: ORAL_TABLET | ORAL | 0 refills | Status: DC

## 2021-10-05 MED ORDER — VITAMIN D (ERGOCALCIFEROL) 1.25 MG (50000 UNIT) PO CAPS: 50000.0000 [IU] | ORAL_CAPSULE | ORAL | 0 refills | Status: DC

## 2021-10-05 MED ORDER — SEMAGLUTIDE (2 MG/DOSE) 8 MG/3ML ~~LOC~~ SOPN
2.0000 mg | PEN_INJECTOR | SUBCUTANEOUS | 0 refills | Status: DC
  Filled 2021-10-05: qty 3, 28d supply, fill #0

## 2021-10-05 NOTE — Telephone Encounter (Signed)
Meds ordered this encounter  Medications   diazepam (VALIUM) 5 MG tablet    Sig: Take 1-2 tablets 30 minutes prior to MRI, may repeat once as needed. Must have driver.    Dispense:  3 tablet    Refill:  0     

## 2021-10-05 NOTE — Addendum Note (Signed)
Addended by: Levert Feinstein on: 10/05/2021 06:01 PM   Modules accepted: Orders

## 2021-10-05 NOTE — Telephone Encounter (Signed)
Pt is requesting medication to be prescribed for her MRI as she is claustrophobic. Preferred pharmacy is the [facility] on [address].  She is scheduled for MRI cervical spine wo & MRI thoracic wo at [facility] for 10/12/21 at 8:30 am.

## 2021-10-10 NOTE — Progress Notes (Signed)
Chief Complaint:   OBESITY Sharon [name] is here to discuss her progress with her obesity treatment plan along with follow-up of her obesity related diagnoses. Sharon [name] is on the Category 2 Plan and states she is following her eating plan approximately 50% of the time. Sharon [name] states she is not currently exercising.  Today's visit was #: 27 Starting weight: 228 lbs Starting date: 10/30/2019 Today's weight: 206 lbs Today's date: 10/05/2021 Total lbs lost to date: 22 Total lbs lost since last in-office visit: +1  Interim History: Sharon [name] just got back from traveling and all kids have gone back to school. She is ready to focus on herself now. Pt is having hip surgery on 11/2021, and her foot pain is improving. She sees neuro for foot pain and getting a work up.   Subjective:   1. Vitamin D deficiency Vit D was not at goal on last check in June 2023. She is currently taking prescription vitamin D 50,000 IU but still struggling to take it weekly. She denies nausea, vomiting or muscle weakness.   2. Impaired fasting glucose Sharon [name] is tolerating Ozempic 1 mg well. However, she is still hungry from time.  3. At risk for side effect of medication Sharon [name] is at risk for side effects of medication due to increasing dose of Ozempic and eating off plan.   Assessment/Plan:   Medications Discontinued During This Encounter  Medication Reason   Vitamin D, Ergocalciferol, (DRISDOL) 1.25 MG (50000 UNIT) CAPS capsule Reorder     Meds ordered this encounter  Medications   Vitamin D, Ergocalciferol, (DRISDOL) 1.25 MG (50000 UNIT) CAPS capsule    Sig: Take 1 capsule (50,000 Units total) by mouth every 7 (seven) days.    Dispense:  4 capsule    Refill:  0    Ov for RF   Semaglutide, 2 MG/DOSE, 8 MG/3ML SOPN    Sig: Inject 2 mg as directed once a week.    Dispense:  3 mL    Refill:  0     1. Vitamin D deficiency Low Vitamin D level contributes to fatigue and are associated with obesity, breast, and colon  cancer. She agrees to continue to take prescription Vitamin D 50,000 IU, but try to take it weekly with Ozempic and will follow-up for routine testing of Vitamin D, at least 2-3 times per year to avoid over-replacement.  Refill- Vitamin D, Ergocalciferol, (DRISDOL) 1.25 MG (50000 UNIT) CAPS capsule; Take 1 capsule (50,000 Units total) by mouth every 7 (seven) days.  Dispense: 4 capsule; Refill: 0  2. Impaired fasting glucose Increase Ozempic to 2 mg weekly on Thursdays. - I again counseled patient on pathophysiology of the disease process of I.R. - Stressed importance of dietary and lifestyle modifications to result in weight loss as first line txmnt - In addition, we discussed the risks and benefits of various medication options which can help [place] in the management of this disease process as well as with weight loss.  We decided to increase the dose today - Continue to decrease simple carbs/ sugars; increase fiber and proteins -> follow her meal plan.   - Explained role of simple carbs and insulin levels on hunger and cravings - Handouts provided at pt's request after education provided.  All concerns/questions addressed.   - Anticipatory guidance given.   Manasi Dishon will continue to work on weight loss, exercise, via their meal plan we devised to help decrease the risk of progressing to diabetes.  - We will  recheck A1c and fasting insulin level in approximately 3 months from last check, or as deemed appropriate.  Increase & Refill- Semaglutide, 2 MG/DOSE, 8 MG/3ML SOPN; Inject 2 mg as directed once a week.  Dispense: 3 mL; Refill: 0  3. At risk for side effect of medication Due to Sharon [name]'s current conditions and medications, she is at a higher risk for drug side effect.  At least 9 minutes was spent on counseling her about these concerns today.  We discussed the benefits and potential risks of these medications, and all of patient's concerns were addressed and questions were answered.  she will call us,  or their PCP or other specialists who treat their conditions with medications, with any questions or concerns that may develop.    4. Obesity with current BMI of 30.4 Sharon [name] is currently in the action stage of change. As such, her goal is to continue with weight loss efforts. She has agreed to keeping a food journal and adhering to recommended goals of 1400-1500 calories and 90+ grams protein.   Log foods in journal and bring log in for next OV.  Exercise goals:  As is  Behavioral modification strategies: increasing lean protein intake, decreasing simple carbohydrates, and planning for success.  Sharon [name] has agreed to follow-up with our clinic in 3 weeks. She was informed of the importance of frequent follow-up visits to maximize her success with intensive lifestyle modifications for her multiple health conditions.   Objective:   Blood pressure 105/75, pulse 65, temperature 98.1 F (36.7 C), height 5\' 9"  (1.753 m), weight 206 lb (93.4 kg), last menstrual period 03/27/2013, SpO2 96 %. Body mass index is 30.42 kg/m.  General: Cooperative, alert, well developed, in no acute distress. HEENT: Conjunctivae and lids unremarkable. Cardiovascular: Regular rhythm.  Lungs: Normal work of breathing. Neurologic: No focal deficits.   Lab Results  Component Value Date   CREATININE 0.86 07/05/2021   BUN 21 07/05/2021   NA 144 07/05/2021   K 5.1 07/05/2021   CL 103 07/05/2021   CO2 27 07/05/2021   Lab Results  Component Value Date   ALT 17 07/05/2021   AST 17 07/05/2021   ALKPHOS 68 07/05/2021   BILITOT 0.3 07/05/2021   Lab Results  Component Value Date   HGBA1C 5.8 (H) 07/05/2021   HGBA1C 5.7 (H) 01/10/2021   HGBA1C 5.7 (H) 09/02/2020   HGBA1C 5.9 (H) 02/16/2020   HGBA1C 6.1 (H) 10/30/2019   Lab Results  Component Value Date   INSULIN 12.6 07/05/2021   INSULIN 8.1 01/10/2021   INSULIN 14.4 09/02/2020   INSULIN 10.9 02/16/2020   INSULIN 20.2 10/30/2019   Lab Results  Component  Value Date   TSH 1.570 01/10/2021   Lab Results  Component Value Date   CHOL 230 (H) 07/05/2021   HDL 75 07/05/2021   LDLCALC 135 (H) 07/05/2021   TRIG 115 07/05/2021   CHOLHDL 3.1 07/05/2021   Lab Results  Component Value Date   VD25OH 36.5 07/05/2021   VD25OH 54.2 01/10/2021   VD25OH 57.7 09/02/2020   Lab Results  Component Value Date   WBC 5.4 01/10/2021   HGB 13.4 01/10/2021   HCT 39.9 01/10/2021   MCV 85 01/10/2021   PLT 245 01/10/2021    Attestation Statements:   Reviewed by clinician on day of visit: allergies, medications, problem list, medical history, surgical history, family history, social history, and previous encounter notes.  I, 01/12/2021, BS, CMA, am acting as transcriptionist for Kyung Rudd, DO.  I have reviewed the above documentation for accuracy and completeness, and I agree with the above. Marjory Sneddon, D.O.  The [place] was signed into law in 2016 which includes the topic of electronic health records.  This provides immediate access to information in MyChart.  This includes consultation notes, operative notes, office notes, lab results and pathology reports.  If you have any questions about what you read please let us know at your next visit so we can discuss your concerns and take corrective action if need be.  We are right here with you.

## 2021-10-12 ENCOUNTER — Ambulatory Visit: Payer: BC Managed Care – PPO

## 2021-10-12 DIAGNOSIS — R202 Paresthesia of skin: Secondary | ICD-10-CM

## 2021-10-12 DIAGNOSIS — R159 Full incontinence of feces: Secondary | ICD-10-CM | POA: Diagnosis not present

## 2021-10-14 ENCOUNTER — Telehealth: Payer: Self-pay | Admitting: Neurology

## 2021-10-14 NOTE — Telephone Encounter (Signed)
  Please call patient, MRI of cervical spine showed multilevel degenerative changes, most obvious is at C5-6, evidence of moderate severe left foraminal narrowing, potential left C6 nerve root compression, rest of the level showed no significant canal stenosis, variable degree of foraminal narrowing,   MRI of thoracic spine also showed mild degenerative changes, no significant canal stenosis, possible T2 hyperintensity foci adjacent to T8,  None of the above findings warrant any acute interventions, and I may review films with her at next follow-up visit       IMPRESSION: This MRI of the cervical spine contrast shows the following: The spinal cord appears normal. At C3-C4, a small central disc protrusion contacts the thecal sac without distorting the spinal cord.  The central canal is narrowed but not enough to be considered spinal stenosis.  There is no nerve root compression. At C5-C6, there is a left paramedian disc protrusion causing moderately severe left foraminal narrowing which could affect the left C6 nerve root. At C4-C5 and C6-C7 there are degenerative changes that do not lead to spinal stenosis or nerve root compression.     IMPRESSION: This MRI of the thoracic spine without contrast shows the following: T2 hyperintense focus within the central spinal cord adjacent to T8.  This is nonspecific but could be due to remote transverse myelitis/demyelination. Mild degenerative changes at T2-3, T8-T9, T10-T11 and T11-T12 that do not lead to spinal stenosis or nerve root compression. Minimal scoliosis convex to the right.

## 2021-10-17 NOTE — Telephone Encounter (Signed)
I spoke with the patient and informed her of MRI results. She verbalized understanding and expressed appreciation for the call. All questions answered.

## 2021-10-24 ENCOUNTER — Ambulatory Visit: Payer: BC Managed Care – PPO | Admitting: Neurology

## 2021-10-25 ENCOUNTER — Ambulatory Visit: Payer: BC Managed Care – PPO | Admitting: Neurology

## 2021-10-30 ENCOUNTER — Other Ambulatory Visit (INDEPENDENT_AMBULATORY_CARE_PROVIDER_SITE_OTHER): Payer: Self-pay | Admitting: Family Medicine

## 2021-10-30 DIAGNOSIS — E559 Vitamin D deficiency, unspecified: Secondary | ICD-10-CM

## 2021-10-31 ENCOUNTER — Other Ambulatory Visit (HOSPITAL_COMMUNITY): Payer: Self-pay

## 2021-10-31 ENCOUNTER — Ambulatory Visit (INDEPENDENT_AMBULATORY_CARE_PROVIDER_SITE_OTHER): Payer: BC Managed Care – PPO | Admitting: Family Medicine

## 2021-10-31 ENCOUNTER — Encounter (INDEPENDENT_AMBULATORY_CARE_PROVIDER_SITE_OTHER): Payer: Self-pay | Admitting: Family Medicine

## 2021-10-31 VITALS — BP 110/70 | HR 71 | Temp 98.3°F | Ht 69.0 in | Wt 206.0 lb

## 2021-10-31 DIAGNOSIS — E559 Vitamin D deficiency, unspecified: Secondary | ICD-10-CM | POA: Diagnosis not present

## 2021-10-31 DIAGNOSIS — R7301 Impaired fasting glucose: Secondary | ICD-10-CM | POA: Diagnosis not present

## 2021-10-31 DIAGNOSIS — Z6830 Body mass index (BMI) 30.0-30.9, adult: Secondary | ICD-10-CM | POA: Diagnosis not present

## 2021-10-31 DIAGNOSIS — E669 Obesity, unspecified: Secondary | ICD-10-CM

## 2021-10-31 DIAGNOSIS — Z9189 Other specified personal risk factors, not elsewhere classified: Secondary | ICD-10-CM

## 2021-10-31 MED ORDER — SEMAGLUTIDE (2 MG/DOSE) 8 MG/3ML ~~LOC~~ SOPN
2.0000 mg | PEN_INJECTOR | SUBCUTANEOUS | 0 refills | Status: DC
  Filled 2021-10-31: qty 3, 28d supply, fill #0

## 2021-10-31 MED ORDER — VITAMIN D (ERGOCALCIFEROL) 1.25 MG (50000 UNIT) PO CAPS
50000.0000 [IU] | ORAL_CAPSULE | ORAL | 0 refills | Status: DC
  Filled 2021-10-31: qty 4, 28d supply, fill #0

## 2021-11-07 NOTE — Progress Notes (Unsigned)
Chief Complaint:   OBESITY Sharon [name] is here to discuss her progress with her obesity treatment plan along with follow-up of her obesity related diagnoses. Sharon [name] is on keeping a food journal and adhering to recommended goals of 1400-1500 calories and 90+ grams protein and states she is following her eating plan approximately 50% of the time. Sharon [name] states she is doing Pilates and playing tennis 50-90 minutes 1-3 times per week.  Today's visit was #: 28 Starting weight: 228 lbs Starting date: 10/30/2019 Today's weight: 206 lbs Today's date: 10/31/2021 Total lbs lost to date: 22 Total lbs lost since last in-office visit: 0  Interim History: Sharon [name] is having surgery on November 9th at [facility] with Dr. Rolley Sims for right hip replacement. She is still not working out as she'd like to.  Subjective:   1. Impaired fasting glucose Sharon [name] takes 1 mg Ozempic on Wednesday and 1 mg on Sunday. This works great for her.  2. Vitamin D deficiency She is currently taking prescription vitamin D 50,000 IU each week. She denies nausea, vomiting or muscle weakness.  3. At risk for deficient intake of food Sharon [name] is at risk for deficient intake of food due to inadequate nutritional intake.  Assessment/Plan:  No orders of the defined types were placed in this encounter.   Medications Discontinued During This Encounter  Medication Reason   Semaglutide, 1 MG/DOSE, 4 MG/3ML SOPN Dose change   Vitamin D, Ergocalciferol, (DRISDOL) 1.25 MG (50000 UNIT) CAPS capsule Reorder   Semaglutide, 2 MG/DOSE, 8 MG/3ML SOPN Reorder     Meds ordered this encounter  Medications   Semaglutide, 2 MG/DOSE, 8 MG/3ML SOPN    Sig: Inject 2 mg as directed once a week.    Dispense:  3 mL    Refill:  0   Vitamin D, Ergocalciferol, (DRISDOL) 1.25 MG (50000 UNIT) CAPS capsule    Sig: Take 1 capsule (50,000 Units total) by mouth every 7 (seven) days.    Dispense:  4 capsule    Refill:  0    Ov for RF     1. Impaired fasting  glucose Continue current treatment plan, as it works great for pt.  Refill- Semaglutide, 2 MG/DOSE, 8 MG/3ML SOPN; Inject 2 mg as directed once a week.  Dispense: 3 mL; Refill: 0  2. Vitamin D deficiency Low Vitamin D level contributes to fatigue and are associated with obesity, breast, and colon cancer. She agrees to continue to take prescription Vitamin D @50 ,000 IU every week and will follow-up for routine testing of Vitamin D, at least 2-3 times per year to avoid over-replacement.  Refill- Vitamin D, Ergocalciferol, (DRISDOL) 1.25 MG (50000 UNIT) CAPS capsule; Take 1 capsule (50,000 Units total) by mouth every 7 (seven) days.  Dispense: 4 capsule; Refill: 0  3. At risk for deficient intake of food Sharon [name] was given extensive education and counseling today of more than 9 minutes on risks associated with deficient food intake.  Counseled her on the importance of following our prescribed meal plan and eating adequate amounts of protein.  Discussed with Sharon [name] that inadequate food intake over longer periods of time can slow their metabolism down significantly.   4. Obesity with current BMI of 30.5 Sharon [name] is currently in the action stage of change. As such, her goal is to continue with weight loss efforts. She has agreed to keeping a food journal and adhering to recommended goals of 1400-1500 calories and 90+ grams protein.   Start journaling  daily food intake totals.  Exercise goals:  Continue as tolerated.  Behavioral modification strategies: increasing lean protein intake, decreasing simple carbohydrates, and planning for success.  Sharon [name] has agreed to follow-up with our clinic in 3-4 weeks. She was informed of the importance of frequent follow-up visits to maximize her success with intensive lifestyle modifications for her multiple health conditions.   Objective:   Blood pressure 110/70, pulse 71, temperature 98.3 F (36.8 C), height 5\' 9"  (1.610 m), weight 206 lb (93.4 kg), last  menstrual period 03/27/2013, SpO2 96 %. Body mass index is 30.42 kg/m.  General: Cooperative, alert, well developed, in no acute distress. HEENT: Conjunctivae and lids unremarkable. Cardiovascular: Regular rhythm.  Lungs: Normal work of breathing. Neurologic: No focal deficits.   Lab Results  Component Value Date   CREATININE 0.86 07/05/2021   BUN 21 07/05/2021   NA 144 07/05/2021   K 5.1 07/05/2021   CL 103 07/05/2021   CO2 27 07/05/2021   Lab Results  Component Value Date   ALT 17 07/05/2021   AST 17 07/05/2021   ALKPHOS 68 07/05/2021   BILITOT 0.3 07/05/2021   Lab Results  Component Value Date   HGBA1C 5.8 (H) 07/05/2021   HGBA1C 5.7 (H) 01/10/2021   HGBA1C 5.7 (H) 09/02/2020   HGBA1C 5.9 (H) 02/16/2020   HGBA1C 6.1 (H) 10/30/2019   Lab Results  Component Value Date   INSULIN 12.6 07/05/2021   INSULIN 8.1 01/10/2021   INSULIN 14.4 09/02/2020   INSULIN 10.9 02/16/2020   INSULIN 20.2 10/30/2019   Lab Results  Component Value Date   TSH 1.570 01/10/2021   Lab Results  Component Value Date   CHOL 230 (H) 07/05/2021   HDL 75 07/05/2021   LDLCALC 135 (H) 07/05/2021   TRIG 115 07/05/2021   CHOLHDL 3.1 07/05/2021   Lab Results  Component Value Date   VD25OH 36.5 07/05/2021   VD25OH 54.2 01/10/2021   VD25OH 57.7 09/02/2020   Lab Results  Component Value Date   WBC 5.4 01/10/2021   HGB 13.4 01/10/2021   HCT 39.9 01/10/2021   MCV 85 01/10/2021   PLT 245 01/10/2021    Attestation Statements:   Reviewed by clinician on day of visit: allergies, medications, problem list, medical history, surgical history, family history, social history, and previous encounter notes.  I, Kathlene November, BS, CMA, am acting as transcriptionist for [employer], DO.   I have reviewed the above documentation for accuracy and completeness, and I agree with the above. Marjory Sneddon, D.O.  The [place] was signed into law in 2016 which includes the  topic of electronic health records.  This provides immediate access to information in MyChart.  This includes consultation notes, operative notes, office notes, lab results and pathology reports.  If you have any questions about what you read please let us know at your next visit so we can discuss your concerns and take corrective action if need be.  We are right here with you.

## 2021-11-08 ENCOUNTER — Other Ambulatory Visit (HOSPITAL_COMMUNITY): Payer: Self-pay

## 2021-11-10 ENCOUNTER — Other Ambulatory Visit: Payer: Self-pay | Admitting: Family Medicine

## 2021-11-10 ENCOUNTER — Ambulatory Visit: Payer: BC Managed Care – PPO | Admitting: Neurology

## 2021-11-10 DIAGNOSIS — Z1231 Encounter for screening mammogram for malignant neoplasm of breast: Secondary | ICD-10-CM

## 2021-11-11 ENCOUNTER — Other Ambulatory Visit (HOSPITAL_COMMUNITY): Payer: Self-pay

## 2021-11-14 DIAGNOSIS — M1611 Unilateral primary osteoarthritis, right hip: Secondary | ICD-10-CM | POA: Diagnosis not present

## 2021-11-14 DIAGNOSIS — Z01818 Encounter for other preprocedural examination: Secondary | ICD-10-CM | POA: Diagnosis not present

## 2021-11-16 ENCOUNTER — Other Ambulatory Visit (INDEPENDENT_AMBULATORY_CARE_PROVIDER_SITE_OTHER): Payer: Self-pay

## 2021-11-16 ENCOUNTER — Other Ambulatory Visit (HOSPITAL_COMMUNITY): Payer: Self-pay

## 2021-11-16 ENCOUNTER — Encounter (INDEPENDENT_AMBULATORY_CARE_PROVIDER_SITE_OTHER): Payer: Self-pay | Admitting: Family Medicine

## 2021-11-16 DIAGNOSIS — R7301 Impaired fasting glucose: Secondary | ICD-10-CM

## 2021-11-16 MED ORDER — SEMAGLUTIDE (1 MG/DOSE) 4 MG/3ML ~~LOC~~ SOPN: 1.0000 mg | PEN_INJECTOR | SUBCUTANEOUS | 0 refills | Status: DC

## 2021-11-16 MED ORDER — SEMAGLUTIDE (1 MG/DOSE) 4 MG/3ML ~~LOC~~ SOPN
1.0000 mg | PEN_INJECTOR | SUBCUTANEOUS | 0 refills | Status: DC
  Filled 2021-11-16: qty 3, 28d supply, fill #0

## 2021-11-23 DIAGNOSIS — G8929 Other chronic pain: Secondary | ICD-10-CM | POA: Diagnosis not present

## 2021-11-23 DIAGNOSIS — M25551 Pain in right hip: Secondary | ICD-10-CM | POA: Diagnosis not present

## 2021-11-23 DIAGNOSIS — M1611 Unilateral primary osteoarthritis, right hip: Secondary | ICD-10-CM | POA: Diagnosis not present

## 2021-11-23 DIAGNOSIS — R6889 Other general symptoms and signs: Secondary | ICD-10-CM | POA: Diagnosis not present

## 2021-11-28 ENCOUNTER — Encounter (INDEPENDENT_AMBULATORY_CARE_PROVIDER_SITE_OTHER): Payer: Self-pay | Admitting: Family Medicine

## 2021-11-28 ENCOUNTER — Ambulatory Visit (INDEPENDENT_AMBULATORY_CARE_PROVIDER_SITE_OTHER): Payer: BC Managed Care – PPO | Admitting: Family Medicine

## 2021-11-28 ENCOUNTER — Other Ambulatory Visit (HOSPITAL_COMMUNITY): Payer: Self-pay

## 2021-11-28 VITALS — BP 103/69 | HR 69 | Temp 99.0°F | Ht 69.0 in | Wt 203.4 lb

## 2021-11-28 DIAGNOSIS — R7303 Prediabetes: Secondary | ICD-10-CM | POA: Diagnosis not present

## 2021-11-28 DIAGNOSIS — E669 Obesity, unspecified: Secondary | ICD-10-CM | POA: Diagnosis not present

## 2021-11-28 DIAGNOSIS — E559 Vitamin D deficiency, unspecified: Secondary | ICD-10-CM

## 2021-11-28 DIAGNOSIS — Z6830 Body mass index (BMI) 30.0-30.9, adult: Secondary | ICD-10-CM | POA: Diagnosis not present

## 2021-11-28 MED ORDER — VITAMIN D (ERGOCALCIFEROL) 1.25 MG (50000 UNIT) PO CAPS
50000.0000 [IU] | ORAL_CAPSULE | ORAL | 0 refills | Status: DC
  Filled 2021-11-28: qty 4, 28d supply, fill #0

## 2021-11-28 MED ORDER — SEMAGLUTIDE (2 MG/DOSE) 8 MG/3ML ~~LOC~~ SOPN
2.0000 mg | PEN_INJECTOR | SUBCUTANEOUS | 0 refills | Status: DC
  Filled 2021-11-28: qty 3, 28d supply, fill #0

## 2021-12-01 DIAGNOSIS — M1611 Unilateral primary osteoarthritis, right hip: Secondary | ICD-10-CM | POA: Diagnosis not present

## 2021-12-01 DIAGNOSIS — Z471 Aftercare following joint replacement surgery: Secondary | ICD-10-CM | POA: Diagnosis not present

## 2021-12-01 DIAGNOSIS — R202 Paresthesia of skin: Secondary | ICD-10-CM | POA: Diagnosis not present

## 2021-12-01 DIAGNOSIS — Z79899 Other long term (current) drug therapy: Secondary | ICD-10-CM | POA: Diagnosis not present

## 2021-12-01 DIAGNOSIS — Z96641 Presence of right artificial hip joint: Secondary | ICD-10-CM | POA: Diagnosis not present

## 2021-12-01 DIAGNOSIS — R7303 Prediabetes: Secondary | ICD-10-CM | POA: Diagnosis not present

## 2021-12-01 DIAGNOSIS — J45909 Unspecified asthma, uncomplicated: Secondary | ICD-10-CM | POA: Diagnosis not present

## 2021-12-01 DIAGNOSIS — K219 Gastro-esophageal reflux disease without esophagitis: Secondary | ICD-10-CM | POA: Diagnosis not present

## 2021-12-01 DIAGNOSIS — G8918 Other acute postprocedural pain: Secondary | ICD-10-CM | POA: Diagnosis not present

## 2021-12-02 DIAGNOSIS — K219 Gastro-esophageal reflux disease without esophagitis: Secondary | ICD-10-CM | POA: Diagnosis not present

## 2021-12-02 DIAGNOSIS — R202 Paresthesia of skin: Secondary | ICD-10-CM | POA: Diagnosis not present

## 2021-12-02 DIAGNOSIS — R7303 Prediabetes: Secondary | ICD-10-CM | POA: Diagnosis not present

## 2021-12-02 DIAGNOSIS — M1611 Unilateral primary osteoarthritis, right hip: Secondary | ICD-10-CM | POA: Diagnosis not present

## 2021-12-02 DIAGNOSIS — Z79899 Other long term (current) drug therapy: Secondary | ICD-10-CM | POA: Diagnosis not present

## 2021-12-02 DIAGNOSIS — J45909 Unspecified asthma, uncomplicated: Secondary | ICD-10-CM | POA: Diagnosis not present

## 2021-12-07 DIAGNOSIS — R6889 Other general symptoms and signs: Secondary | ICD-10-CM | POA: Diagnosis not present

## 2021-12-07 DIAGNOSIS — M1611 Unilateral primary osteoarthritis, right hip: Secondary | ICD-10-CM | POA: Diagnosis not present

## 2021-12-07 DIAGNOSIS — G8929 Other chronic pain: Secondary | ICD-10-CM | POA: Diagnosis not present

## 2021-12-07 DIAGNOSIS — M25551 Pain in right hip: Secondary | ICD-10-CM | POA: Diagnosis not present

## 2021-12-07 NOTE — Progress Notes (Signed)
Chief Complaint:   OBESITY Sharon [name] is here to discuss her progress with her obesity treatment plan along with follow-up of her obesity related diagnoses. Sharon [name] is on keeping a food journal and adhering to recommended goals of 1400-1500 calories and 90+ grams protein and states she is following her eating plan approximately 25% of the time. Sharon [name] states she is playing tennis and doing Pilates 90 minutes 1 times per week.  Today's visit was #: 82 Starting weight: 228 lbs Starting date: 10/30/2019 Today's weight: 203 lbs Today's date: 11/28/2021 Total lbs lost to date: 25 Total lbs lost since last in-office visit: 3  Interim History: Sharon [name] is having surgery on Thursday- Right total hip. She misses tennis and being more active. Pt has been having more pain lately.  Subjective:   1. Prediabetes Sharon [name]'s A1c was 5.8 on June 13, and 2 weeks ago it was 5.7.   2. Vitamin D deficiency Pt hasn't been taking Ergocalciferol regularly. She has been consistent for 3 weeks now.  Assessment/Plan:  No orders of the defined types were placed in this encounter.   Medications Discontinued During This Encounter  Medication Reason   Semaglutide, 1 MG/DOSE, 4 MG/3ML SOPN    Vitamin D, Ergocalciferol, (DRISDOL) 1.25 MG (50000 UNIT) CAPS capsule Reorder     Meds ordered this encounter  Medications   Vitamin D, Ergocalciferol, (DRISDOL) 1.25 MG (50000 UNIT) CAPS capsule    Sig: Take 1 capsule (50,000 Units total) by mouth every 7 (seven) days.    Dispense:  4 capsule    Refill:  0    Ov for RF   Semaglutide, 2 MG/DOSE, 8 MG/3ML SOPN    Sig: Inject 2 mg as directed once a week.    Dispense:  3 mL    Refill:  0     1. Prediabetes Ande will continue to work on weight loss, exercise, and decreasing simple carbohydrates to help decrease the risk of diabetes.  Pt likes to take Semaglutide twice a week, but will hold off 1 week prior to surgery.  Refill- Semaglutide, 2 MG/DOSE, 8 MG/3ML SOPN; Inject 2  mg as directed once a week.  Dispense: 3 mL; Refill: 0  2. Vitamin D deficiency Low Vitamin D level contributes to fatigue and are associated with obesity, breast, and colon cancer. She agrees to continue to take prescription Vitamin D 50,000 IU every week and will follow-up for routine testing of Vitamin D, at least 2-3 times per year to avoid over-replacement. Repeat Vit D in early January.  Refill- Vitamin D, Ergocalciferol, (DRISDOL) 1.25 MG (50000 UNIT) CAPS capsule; Take 1 capsule (50,000 Units total) by mouth every 7 (seven) days.  Dispense: 4 capsule; Refill: 0  3. Obesity with current BMI of 30.0 Sharon [name] is currently in the action stage of change. As such, her goal is to continue with weight loss efforts. She has agreed to keeping a food journal and adhering to recommended goals of 1400-1500 calories and 90+ grams protein.   Use bone broth for higher protein soups. Meal prepping ideas discussed with pt to prepare for upcoming surgery.  Exercise goals:  As is  Behavioral modification strategies: increasing lean protein intake, increasing water intake, and planning for success.  Sharon [name] has agreed to follow-up with our clinic in 4-5 weeks. She was informed of the importance of frequent follow-up visits to maximize her success with intensive lifestyle modifications for her multiple health conditions.   Objective:   Blood pressure 103/69, pulse 69,  temperature 99 F (37.2 C), height 5\' 9"  (1.753 m), weight 203 lb 6.4 oz (92.3 kg), last menstrual period 03/27/2013, SpO2 95 %. Body mass index is 30.04 kg/m.  General: Cooperative, alert, well developed, in no acute distress. HEENT: Conjunctivae and lids unremarkable. Cardiovascular: Regular rhythm.  Lungs: Normal work of breathing. Neurologic: No focal deficits.   Lab Results  Component Value Date   CREATININE 0.86 07/05/2021   BUN 21 07/05/2021   NA 144 07/05/2021   K 5.1 07/05/2021   CL 103 07/05/2021   CO2 27 07/05/2021   Lab  Results  Component Value Date   ALT 17 07/05/2021   AST 17 07/05/2021   ALKPHOS 68 07/05/2021   BILITOT 0.3 07/05/2021   Lab Results  Component Value Date   HGBA1C 5.8 (H) 07/05/2021   HGBA1C 5.7 (H) 01/10/2021   HGBA1C 5.7 (H) 09/02/2020   HGBA1C 5.9 (H) 02/16/2020   HGBA1C 6.1 (H) 10/30/2019   Lab Results  Component Value Date   INSULIN 12.6 07/05/2021   INSULIN 8.1 01/10/2021   INSULIN 14.4 09/02/2020   INSULIN 10.9 02/16/2020   INSULIN 20.2 10/30/2019   Lab Results  Component Value Date   TSH 1.570 01/10/2021   Lab Results  Component Value Date   CHOL 230 (H) 07/05/2021   HDL 75 07/05/2021   LDLCALC 135 (H) 07/05/2021   TRIG 115 07/05/2021   CHOLHDL 3.1 07/05/2021   Lab Results  Component Value Date   VD25OH 36.5 07/05/2021   VD25OH 54.2 01/10/2021   VD25OH 57.7 09/02/2020   Lab Results  Component Value Date   WBC 5.4 01/10/2021   HGB 13.4 01/10/2021   HCT 39.9 01/10/2021   MCV 85 01/10/2021   PLT 245 01/10/2021   Attestation Statements:   Reviewed by clinician on day of visit: allergies, medications, problem list, medical history, surgical history, family history, social history, and previous encounter notes.  I, 01/12/2021, BS, CMA, am acting as transcriptionist for Kyung Rudd, DO.  I have reviewed the above documentation for accuracy and completeness, and I agree with the above. [employer], D.O.  The 21st Century Cures Act was signed into law in 2016 which includes the topic of electronic health records.  This provides immediate access to information in MyChart.  This includes consultation notes, operative notes, office notes, lab results and pathology reports.  If you have any questions about what you read please let 2017 know at your next visit so we can discuss your concerns and take corrective action if need be.  We are right here with you.

## 2021-12-14 DIAGNOSIS — M25651 Stiffness of right hip, not elsewhere classified: Secondary | ICD-10-CM | POA: Diagnosis not present

## 2021-12-14 DIAGNOSIS — R29898 Other symptoms and signs involving the musculoskeletal system: Secondary | ICD-10-CM | POA: Diagnosis not present

## 2021-12-14 DIAGNOSIS — Z7409 Other reduced mobility: Secondary | ICD-10-CM | POA: Diagnosis not present

## 2021-12-14 DIAGNOSIS — Z96641 Presence of right artificial hip joint: Secondary | ICD-10-CM | POA: Diagnosis not present

## 2021-12-20 DIAGNOSIS — Z7409 Other reduced mobility: Secondary | ICD-10-CM | POA: Diagnosis not present

## 2021-12-20 DIAGNOSIS — M25651 Stiffness of right hip, not elsewhere classified: Secondary | ICD-10-CM | POA: Diagnosis not present

## 2021-12-20 DIAGNOSIS — R29898 Other symptoms and signs involving the musculoskeletal system: Secondary | ICD-10-CM | POA: Diagnosis not present

## 2021-12-20 DIAGNOSIS — Z96641 Presence of right artificial hip joint: Secondary | ICD-10-CM | POA: Diagnosis not present

## 2021-12-22 DIAGNOSIS — M25651 Stiffness of right hip, not elsewhere classified: Secondary | ICD-10-CM | POA: Diagnosis not present

## 2021-12-22 DIAGNOSIS — Z96641 Presence of right artificial hip joint: Secondary | ICD-10-CM | POA: Diagnosis not present

## 2021-12-22 DIAGNOSIS — Z7409 Other reduced mobility: Secondary | ICD-10-CM | POA: Diagnosis not present

## 2021-12-22 DIAGNOSIS — R29898 Other symptoms and signs involving the musculoskeletal system: Secondary | ICD-10-CM | POA: Diagnosis not present

## 2021-12-26 ENCOUNTER — Ambulatory Visit (INDEPENDENT_AMBULATORY_CARE_PROVIDER_SITE_OTHER): Payer: BC Managed Care – PPO | Admitting: Family Medicine

## 2021-12-26 ENCOUNTER — Encounter (INDEPENDENT_AMBULATORY_CARE_PROVIDER_SITE_OTHER): Payer: Self-pay | Admitting: Family Medicine

## 2021-12-26 ENCOUNTER — Other Ambulatory Visit (HOSPITAL_COMMUNITY): Payer: Self-pay

## 2021-12-26 VITALS — BP 99/68 | HR 69 | Temp 98.9°F | Ht 69.0 in | Wt 201.8 lb

## 2021-12-26 DIAGNOSIS — Z6829 Body mass index (BMI) 29.0-29.9, adult: Secondary | ICD-10-CM | POA: Diagnosis not present

## 2021-12-26 DIAGNOSIS — R7303 Prediabetes: Secondary | ICD-10-CM | POA: Diagnosis not present

## 2021-12-26 DIAGNOSIS — E559 Vitamin D deficiency, unspecified: Secondary | ICD-10-CM

## 2021-12-26 DIAGNOSIS — E669 Obesity, unspecified: Secondary | ICD-10-CM | POA: Diagnosis not present

## 2021-12-26 MED ORDER — VITAMIN D (ERGOCALCIFEROL) 1.25 MG (50000 UNIT) PO CAPS
50000.0000 [IU] | ORAL_CAPSULE | ORAL | 0 refills | Status: DC
  Filled 2021-12-26: qty 4, 28d supply, fill #0

## 2021-12-26 MED ORDER — SEMAGLUTIDE-WEIGHT MANAGEMENT 2.4 MG/0.75ML ~~LOC~~ SOAJ
2.4000 mg | SUBCUTANEOUS | 0 refills | Status: DC
  Filled 2021-12-26: qty 3, 28d supply, fill #0

## 2021-12-26 MED ORDER — SEMAGLUTIDE (2 MG/DOSE) 8 MG/3ML ~~LOC~~ SOPN
2.0000 mg | PEN_INJECTOR | SUBCUTANEOUS | 0 refills | Status: DC
  Filled 2021-12-26: qty 3, 28d supply, fill #0

## 2021-12-28 ENCOUNTER — Other Ambulatory Visit (HOSPITAL_COMMUNITY): Payer: Self-pay

## 2021-12-28 DIAGNOSIS — Z7409 Other reduced mobility: Secondary | ICD-10-CM | POA: Diagnosis not present

## 2021-12-28 DIAGNOSIS — R29898 Other symptoms and signs involving the musculoskeletal system: Secondary | ICD-10-CM | POA: Diagnosis not present

## 2021-12-28 DIAGNOSIS — M25651 Stiffness of right hip, not elsewhere classified: Secondary | ICD-10-CM | POA: Diagnosis not present

## 2021-12-28 DIAGNOSIS — Z96641 Presence of right artificial hip joint: Secondary | ICD-10-CM | POA: Diagnosis not present

## 2021-12-29 ENCOUNTER — Ambulatory Visit
Admission: RE | Admit: 2021-12-29 | Discharge: 2021-12-29 | Disposition: A | Discharge status: Home / Self Care | Payer: BC Managed Care – PPO | Source: Ambulatory Visit | Attending: Family Medicine | Admitting: Family Medicine

## 2021-12-29 DIAGNOSIS — Z1231 Encounter for screening mammogram for malignant neoplasm of breast: Secondary | ICD-10-CM

## 2021-12-30 DIAGNOSIS — Z7409 Other reduced mobility: Secondary | ICD-10-CM | POA: Diagnosis not present

## 2021-12-30 DIAGNOSIS — R29898 Other symptoms and signs involving the musculoskeletal system: Secondary | ICD-10-CM | POA: Diagnosis not present

## 2021-12-30 DIAGNOSIS — Z96641 Presence of right artificial hip joint: Secondary | ICD-10-CM | POA: Diagnosis not present

## 2021-12-30 DIAGNOSIS — M25651 Stiffness of right hip, not elsewhere classified: Secondary | ICD-10-CM | POA: Diagnosis not present

## 2022-01-02 ENCOUNTER — Ambulatory Visit (INDEPENDENT_AMBULATORY_CARE_PROVIDER_SITE_OTHER): Payer: BC Managed Care – PPO | Admitting: Family Medicine

## 2022-01-03 NOTE — Progress Notes (Signed)
Chief Complaint:   OBESITY Sharon [name] is here to discuss her progress with her obesity treatment plan along with follow-up of her obesity related diagnoses. Daren is on keeping a food journal and adhering to recommended goals of 1400-1500 calories and 90 protein and states she is following her eating plan approximately 20% of the time. Leisel states she is not exercising.   Today's visit was #: 30 Starting weight: 228 lbs Starting date: 10/30/2019 Today's weight: 201 lbs Today's date: 12/26/2021 Total lbs lost to date: 27 lbs Total lbs lost since last in-office visit: 2 lbs  Interim History: She is doing PT twice weekly.  Surgery on hip went well overall.  She lost weight because of not eating during that time. Hip surgery was in early November.   Subjective:   1. Prediabetes Dera has a diagnosis of prediabetes based on her elevated HgA1c and was informed this puts her at greater risk of developing diabetes. She continues to work on diet and exercise to decrease her risk of diabetes. She denies nausea or hypoglycemia.  2. Vitamin D deficiency Patient has been taking Vitamin D regularly for 1-1.5 months.   Assessment/Plan:  No orders of the defined types were placed in this encounter.   Medications Discontinued During This Encounter  Medication Reason   Vitamin D, Ergocalciferol, (DRISDOL) 1.25 MG (50000 UNIT) CAPS capsule Reorder   Semaglutide, 2 MG/DOSE, 8 MG/3ML SOPN Reorder     Meds ordered this encounter  Medications   Vitamin D, Ergocalciferol, (DRISDOL) 1.25 MG (50000 UNIT) CAPS capsule    Sig: Take 1 capsule (50,000 Units total) by mouth every 7 (seven) days.    Dispense:  4 capsule    Refill:  0    Ov for RF   Semaglutide, 2 MG/DOSE, 8 MG/3ML SOPN    Sig: Inject 2 mg as directed once a week.    Dispense:  3 mL    Refill:  0   Semaglutide-Weight Management 2.4 MG/0.75ML SOAJ    Sig: Inject 2.4 mg into the skin once a week for 28 days. (DO NOT TAKE with ozempic)     Dispense:  3 mL    Refill:  0     1. Prediabetes Refill Ozempic if cheaper or same amount, start Wegovy. (All depending on cost of medication which one to start).  Refill - Semaglutide, 2 MG/DOSE, 8 MG/3ML SOPN; Inject 2 mg as directed once a week.  Dispense: 3 mL; Refill: 0  2. Vitamin D deficiency Recheck level around January 2024.   Refill - Vitamin D, Ergocalciferol, (DRISDOL) 1.25 MG (50000 UNIT) CAPS capsule; Take 1 capsule (50,000 Units total) by mouth every 7 (seven) days.  Dispense: 4 capsule; Refill: 0  3. Obesity with current BMI of 29.8 Patient knows not to get both Wegovy and Ozempic.  She understands our intention if cost of Wegovy not much more.  She will transition to [facility] and discontinue Ozempic. Holiday eating guide given today.   Start - Semaglutide-Weight Management 2.4 MG/0.75ML SOAJ; Inject 2.4 mg into the skin once a week for 28 days. (DO NOT TAKE with ozempic)  Dispense: 3 mL; Refill: 0  [name] is currently in the action stage of change. As such, her goal is to continue with weight loss efforts. She has agreed to keeping a food journal and adhering to recommended goals of 1400-1500 calories and 90+ protein daily.  Exercise goals:  As is.   Behavioral modification strategies: increasing lean protein intake and  decreasing simple carbohydrates.  [name] has agreed to follow-up with our clinic in 4 weeks. She was informed of the importance of frequent follow-up visits to maximize her success with intensive lifestyle modifications for her multiple health conditions.   Objective:   Blood pressure 99/68, pulse 69, temperature 98.9 F (37.2 C), height 5\' 9"  (1.753 m), weight 201 lb 12.8 oz (91.5 kg), last menstrual period 03/27/2013, SpO2 96 %. Body mass index is 29.8 kg/m.  General: Cooperative, alert, well developed, in no acute distress. HEENT: Conjunctivae and lids unremarkable. Cardiovascular: Regular rhythm.  Lungs: Normal work of breathing. Neurologic: No  focal deficits.   Lab Results  Component Value Date   CREATININE 0.86 07/05/2021   BUN 21 07/05/2021   NA 144 07/05/2021   K 5.1 07/05/2021   CL 103 07/05/2021   CO2 27 07/05/2021   Lab Results  Component Value Date   ALT 17 07/05/2021   AST 17 07/05/2021   ALKPHOS 68 07/05/2021   BILITOT 0.3 07/05/2021   Lab Results  Component Value Date   HGBA1C 5.8 (H) 07/05/2021   HGBA1C 5.7 (H) 01/10/2021   HGBA1C 5.7 (H) 09/02/2020   HGBA1C 5.9 (H) 02/16/2020   HGBA1C 6.1 (H) 10/30/2019   Lab Results  Component Value Date   INSULIN 12.6 07/05/2021   INSULIN 8.1 01/10/2021   INSULIN 14.4 09/02/2020   INSULIN 10.9 02/16/2020   INSULIN 20.2 10/30/2019   Lab Results  Component Value Date   TSH 1.570 01/10/2021   Lab Results  Component Value Date   CHOL 230 (H) 07/05/2021   HDL 75 07/05/2021   LDLCALC 135 (H) 07/05/2021   TRIG 115 07/05/2021   CHOLHDL 3.1 07/05/2021   Lab Results  Component Value Date   VD25OH 36.5 07/05/2021   VD25OH 54.2 01/10/2021   VD25OH 57.7 09/02/2020   Lab Results  Component Value Date   WBC 5.4 01/10/2021   HGB 13.4 01/10/2021   HCT 39.9 01/10/2021   MCV 85 01/10/2021   PLT 245 01/10/2021   No results found for: "IRON", "TIBC", "FERRITIN"  Attestation Statements:   Reviewed by clinician on day of visit: allergies, medications, problem list, medical history, surgical history, family history, social history, and previous encounter notes.  I, 01/12/2021, RMA, am acting as [employer] for Energy manager, DO.   I have reviewed the above documentation for accuracy and completeness, and I agree with the above. [employer], D.O.  The 21st Century Cures Act was signed into law in 2016 which includes the topic of electronic health records.  This provides immediate access to information in MyChart.  This includes consultation notes, operative notes, office notes, lab results and pathology reports.  If you have any questions about  what you read please let 2017 know at your next visit so we can discuss your concerns and take corrective action if need be.  We are right here with you.

## 2022-01-04 ENCOUNTER — Encounter: Payer: Self-pay | Admitting: Neurology

## 2022-01-04 ENCOUNTER — Ambulatory Visit: Payer: BC Managed Care – PPO | Admitting: Neurology

## 2022-01-04 ENCOUNTER — Ambulatory Visit (INDEPENDENT_AMBULATORY_CARE_PROVIDER_SITE_OTHER): Payer: BC Managed Care – PPO | Admitting: Neurology

## 2022-01-04 VITALS — BP 118/73 | HR 68

## 2022-01-04 DIAGNOSIS — R937 Abnormal findings on diagnostic imaging of other parts of musculoskeletal system: Secondary | ICD-10-CM | POA: Diagnosis not present

## 2022-01-04 DIAGNOSIS — R29898 Other symptoms and signs involving the musculoskeletal system: Secondary | ICD-10-CM | POA: Diagnosis not present

## 2022-01-04 DIAGNOSIS — R799 Abnormal finding of blood chemistry, unspecified: Secondary | ICD-10-CM | POA: Diagnosis not present

## 2022-01-04 DIAGNOSIS — R202 Paresthesia of skin: Secondary | ICD-10-CM | POA: Diagnosis not present

## 2022-01-04 DIAGNOSIS — R159 Full incontinence of feces: Secondary | ICD-10-CM

## 2022-01-04 DIAGNOSIS — M25651 Stiffness of right hip, not elsewhere classified: Secondary | ICD-10-CM | POA: Diagnosis not present

## 2022-01-04 DIAGNOSIS — Z96641 Presence of right artificial hip joint: Secondary | ICD-10-CM | POA: Diagnosis not present

## 2022-01-04 DIAGNOSIS — Z79899 Other long term (current) drug therapy: Secondary | ICD-10-CM | POA: Diagnosis not present

## 2022-01-04 DIAGNOSIS — Z7409 Other reduced mobility: Secondary | ICD-10-CM | POA: Diagnosis not present

## 2022-01-04 NOTE — Progress Notes (Signed)
Chief Complaint  Patient presents with   Procedure    Rm 4 alone       ASSESSMENT AND PLAN  Cortasia Screws is a 56 y.o. female   Acute onset of bilateral toes paresthesia, intermittent bowel incontinence  Brisk reflex on examinations,  MRI of lumbar spine without contrast August 26, 2021 multilevel degenerative disc disease, most noticeable at L5-S1, moderate bilateral foraminal stenosis, no significant canal stenosis  MRI of the cervical spine showed multilevel degenerative changes, no evidence of spinal cord compression variable degree of foraminal narrowing  MRI of thoracic spine in September 2023 showed T2 hyperintensity focus within the central spinal canal at T8,  mild degenerative changes, no evidence of canal stenosis   The abnormal MRI of thoracic spine described above, along with brisk reflex, and intermittent episode of bowel incontinence, that is worrisome for thoracic myelopathic changes,  Complete laboratory evaluations, to rule out inflammatory, nutritional deficiency, infectious etiology  MRI of the brain to complete evaluations  Discussed with patient, the other option is lumbar puncture, decided to observing her since the for 6 months, if she continues to have worsening neurological symptoms, we will go ahead proceed,   DIAGNOSTIC DATA (LABS, IMAGING, TESTING) - I reviewed patient records, labs, notes, testing and imaging myself where available.   MEDICAL HISTORY:  Sharon [name] is a 56 year old female, seen in request by neurosurgeon Dr. Hoyt Koch for evaluation of toes paresthesia  I reviewed and summarized the referring note. PMhx. Pre-DM  She has been physically active, plays tennis regularly, noticed some right hip pain,  On August 16, 2021, while traveling outside, she woke up noticed bilateral toes cold sensation, as if they were in the snow, then she realized they were not, heavy, swollen sensation, also noticed perineal numbness when  she wiped herself, she felt there was a tissue start there, she also had an episode of bowel incontinence, her perineal area abnormal sensation last until  August 30, 2016 2023, she denies recurrent bowel and bladder incontinence  She still have abnormal sensation at her toes, there is something stuck in the ball of her feet, she has some gait abnormality, attributed to her right hip pain, denies bilateral hands paresthesia   Personally reviewed MRI of lumbar August 26, 2021, multilevel degenerative changes, most noticeable at L5-S1, moderate foraminal stenosis bilaterally,  Update January 04, 2022: She return for a nerve conduction study today, which was normal, no evidence of large fiber peripheral neuropathy no evidence of active lumbosacral radiculopathy.  She continue complains of bilateral toes numbness tingling, especially first toe, symmetric, as if something is underneath her toes  She confirmed a year history of intermittent bowel incontinence episode, it happens every few months, but she reported 1 episode woke up had bowel incontinence for the reasons  She had right hip replacement in November 2023, recovering well following that, never had any upper extremity symptoms, denies bladder incontinence   PHYSICAL EXAM:   Vitals:   01/04/22 0819  BP: 118/73  Pulse: 68    There is no height or weight on file to calculate BMI.  PHYSICAL EXAMNIATION:  Gen: NAD, conversant, well nourised, well groomed                     Cardiovascular: Regular rate rhythm, no peripheral edema, warm, nontender. Eyes: Conjunctivae clear without exudates or hemorrhage Neck: Supple, no carotid bruits. Pulmonary: Clear to auscultation bilaterally   NEUROLOGICAL EXAM:  MENTAL STATUS: Speech/cognition:  Awake, alert, oriented to history taking and casual conversation CRANIAL NERVES: CN II: Visual fields are full to confrontation. Pupils are round equal and briskly reactive to light. CN III, IV, VI:  extraocular movement are normal. No ptosis. CN V: Facial sensation is intact to light touch CN VII: Face is symmetric with normal eye closure  CN VIII: Hearing is normal to causal conversation. CN IX, X: Phonation is normal. CN XI: Head turning and shoulder shrug are intact  MOTOR: There is no pronator drift of out-stretched arms. Muscle bulk and tone are normal. Muscle strength is normal.  REFLEXES: Reflexes are 2  and symmetric at the biceps, triceps, 3/3 knees, and ankles. Plantar responses are flexor.  SENSORY: Intact to light touch, pinprick and vibratory sensation are intact in fingers and toes.  COORDINATION: There is no trunk or limb dysmetria noted.  GAIT/STANCE: Mildly antalgic due to right hip pain  REVIEW OF SYSTEMS:  Full 14 system review of systems performed and notable only for as above All other review of systems were negative.   ALLERGIES: No Known Allergies  HOME MEDICATIONS: Current Outpatient Medications  Medication Sig Dispense Refill   ASPIRIN 81 PO Take by mouth.     celecoxib (CELEBREX) 200 MG capsule Take by mouth.     methocarbamol (ROBAXIN) 500 MG tablet Take 500 mg by mouth 4 (four) times daily as needed.     Semaglutide, 2 MG/DOSE, 8 MG/3ML SOPN Inject 2 mg as directed once a week. 3 mL 0   amitriptyline (ELAVIL) 25 MG tablet Take 1 tablet (25 mg total) by mouth at bedtime as needed for sleep. (Patient not taking: Reported on 01/04/2022) 30 tablet 0   diazepam (VALIUM) 5 MG tablet Take 1 tablet (5 mg total) by mouth every 6 (six) hours as needed for anxiety. (Patient not taking: Reported on 01/04/2022) 2 tablet 0   diazepam (VALIUM) 5 MG tablet Take 1-2 tablets 30 minutes prior to MRI, may repeat once as needed. Must have driver. (Patient not taking: Reported on 01/04/2022) 3 tablet 0   gabapentin (NEURONTIN) 300 MG capsule Take 300 mg by mouth 3 (three) times daily. (Patient not taking: Reported on 01/04/2022)     ibuprofen (ADVIL) 200 MG tablet  Take 400 mg by mouth every 6 (six) hours as needed for moderate pain. (Patient not taking: Reported on 01/04/2022)     pantoprazole (PROTONIX) 20 MG tablet Take 1 tablet (20 mg total) by mouth daily. (Patient not taking: Reported on 01/04/2022) 30 tablet 0   [START ON 04/21/2022] Semaglutide-Weight Management 2.4 MG/0.75ML SOAJ Inject 2.4 mg into the skin once a week for 28 days. (DO NOT TAKE with ozempic) (Patient not taking: Reported on 01/04/2022) 3 mL 0   Vitamin D, Ergocalciferol, (DRISDOL) 1.25 MG (50000 UNIT) CAPS capsule Take 1 capsule (50,000 Units total) by mouth every 7 (seven) days. (Patient not taking: Reported on 01/04/2022) 4 capsule 0   No current facility-administered medications for this visit.    PAST MEDICAL HISTORY: Past Medical History:  Diagnosis Date   Anemia    Back pain    Exertional asthma    Female infertility    GERD (gastroesophageal reflux disease)    High cholesterol    Right leg pain    Vitamin D deficiency     PAST SURGICAL HISTORY: Past Surgical History:  Procedure Laterality Date   CESAREAN SECTION     LAPAROSCOPY      FAMILY HISTORY: Family History  Problem Relation Age  of Onset   Diabetes Father    Lung cancer Father    Cancer Father    High Cholesterol Mother     SOCIAL HISTORY: Social History   Socioeconomic History   Marital status: Married    Spouse name: Alden Server   Number of children: 3   Years of education: 16   Highest education level: Not on file  Occupational History   Occupation: Homemaker  Tobacco Use   Smoking status: Never   Smokeless tobacco: Never  Substance and Sexual Activity   Alcohol use: Yes    Comment: rare, social   Drug use: No   Sexual activity: Not on file  Other Topics Concern   Not on file  Social History Narrative   Patient is married Alden Server) and lives at home with her family.   Patient has three children.   Patient is a homemaker.   Patient does drink caffeine maybe two cups per week.    Patient is right-handed.   Patient has a BS degree.            Social Determinants of Health   Financial Resource Strain: Not on file  Food Insecurity: Not on file  Transportation Needs: Not on file  Physical Activity: Not on file  Stress: Not on file  Social Connections: Not on file  Intimate Partner Violence: Not on file      Levert Feinstein, M.D. Ph.D.  [facility] [address] [place], [place] [address] Ph: [phone] Fax: [phone]  CC:  Daisy Floro, MD [address] [place],  [place] [address]  Daisy Floro, MD

## 2022-01-04 NOTE — Procedures (Signed)
Full Name: Sharon [name] Gender: Female MRN #: [id number] Date of Birth: [birth year]    Visit Date: 01/04/2022 08:35 Age: 56 Years Examining Physician: Levert Feinstein Referring Physician: Levert Feinstein Height: 5 feet 9 inch History: 56 year old female complains of acute onset bilateral lower extremity paresthesia, intermittent bowel incontinence  Summary of the test:  Nerve conduction study: Bilateral sural, superficial peroneal sensory responses were normal. Bilateral tibial, right peroneal to EDB motor responses were normal.  Electromyography: Selected needle examinations of bilateral lower extremity muscles and lumbosacral paraspinal muscles were normal.  Conclusion: This is a normal study.  There is no electrodiagnostic evidence of large fiber peripheral neuropathy or bilateral lumbosacral radiculopathy.   ------------------------------- Levert Feinstein M.D. Ph.D.  [facility] [address] [place], [place] [address] Tel: [phone] Fax: [phone]  Verbal informed consent was obtained from the patient, patient was informed of potential risk of procedure, including bruising, bleeding, hematoma formation, infection, muscle weakness, muscle pain, numbness, among others.        MNC    Nerve / Sites Muscle Latency Ref. Amplitude Ref. Rel Amp Segments Distance Velocity Ref. Area    ms ms mV mV %  cm m/s m/s mVms  R Peroneal - EDB     Ankle EDB 5.0 ?6.5 6.8 ?2.0 100 Ankle - EDB 9   22.7     Fib head EDB 11.9  6.3  93.6 Fib head - Ankle 31 45 ?44 23.7     Pop fossa EDB 14.5  5.9  93.5 Pop fossa - Fib head 14 53 ?44 20.7         Pop fossa - Ankle      R Tibial - AH     Ankle AH 6.4 ?5.8 7.8 ?4.0 100 Ankle - AH 9   17.6     Pop fossa AH 17.7  5.3  68.1 Pop fossa - Ankle 50 44 ?41 15.2  L Tibial - AH     Ankle AH 5.9 ?5.8 10.9 ?4.0 100 Ankle - AH 9   27.6     Pop fossa AH 15.8  8.7  79.7 Pop fossa - Ankle 44 44 ?41 24.3           SNC    Nerve  / Sites Rec. Site Peak Lat Ref.  Amp Ref. Segments Distance    ms ms V V  cm  L Sural - Ankle (Calf)     Calf Ankle 4.1 ?4.4 10 ?6 Calf - Ankle 14  R Sural - Ankle     Lat leg Ankle 4.2 ?4.4 12 ?6 Lat leg - Ankle 14  R Superficial peroneal - Ankle     Lat leg Ankle 3.8 ?4.4 11 ?6 Lat leg - Ankle 14  L Superficial peroneal - Ankle     Lat leg Ankle 4.2 ?4.4 10 ?6 Lat leg - Ankle 14             F  Wave    Nerve F Lat Ref.   ms ms  R Tibial - AH 59.3 ?56.0  L Tibial - AH 56.3 ?56.0         EMG Summary Table    Spontaneous MUAP Recruitment  Muscle IA Fib PSW Fasc Other Amp Dur. Poly Pattern  R. Tibialis anterior Normal None None None _______ Normal Normal Normal Normal  R. Tibialis posterior Normal None None None _______ Normal Normal Normal Normal  R. Peroneus longus Normal  None None None _______ Normal Normal Normal Normal  R. Gastrocnemius (Medial head) Normal None None None _______ Normal Normal Normal Normal  R. Vastus lateralis Normal None None None _______ Normal Normal Normal Normal  L. Tibialis anterior Normal None None None _______ Normal Normal Normal Normal  L. Tibialis posterior Normal None None None _______ Normal Normal Normal Normal  L. Peroneus longus Normal None None None _______ Normal Normal Normal Normal  L. Gastrocnemius (Medial head) Normal None None None _______ Normal Normal Normal Normal  L. Vastus lateralis Normal None None None _______ Normal Normal Normal Normal  R. Lumbar paraspinals (low) Normal None None None _______ Normal Normal Normal Normal  R. Lumbar paraspinals (mid) Normal None None None _______ Normal Normal Normal Normal  L. Lumbar paraspinals (low) Normal None None None _______ Normal Normal Normal Normal  L. Lumbar paraspinals (mid) Normal None None None _______ Normal Normal Normal Normal

## 2022-01-06 LAB — MULTIPLE MYELOMA PANEL, SERUM
Albumin SerPl Elph-Mcnc: 3.5 g/dL (ref 2.9–4.4)
Albumin/Glob SerPl: 1.1 (ref 0.7–1.7)
Alpha 1: 0.2 g/dL (ref 0.0–0.4)
Alpha2 Glob SerPl Elph-Mcnc: 0.9 g/dL (ref 0.4–1.0)
B-Globulin SerPl Elph-Mcnc: 1 g/dL (ref 0.7–1.3)
Gamma Glob SerPl Elph-Mcnc: 1.4 g/dL (ref 0.4–1.8)
Globulin, Total: 3.5 g/dL (ref 2.2–3.9)
IgA/Immunoglobulin A, Serum: 155 mg/dL (ref 87–352)
IgG (Immunoglobin G), Serum: 1409 mg/dL (ref 586–1602)
IgM (Immunoglobulin M), Srm: 148 mg/dL (ref 26–217)
Total Protein: 7 g/dL (ref 6.0–8.5)

## 2022-01-06 LAB — ANA W/REFLEX IF POSITIVE: Anti Nuclear Antibody (ANA): NEGATIVE

## 2022-01-06 LAB — C-REACTIVE PROTEIN: CRP: 2 mg/L (ref 0–10)

## 2022-01-06 LAB — HIV ANTIBODY (ROUTINE TESTING W REFLEX): HIV Screen 4th Generation wRfx: NONREACTIVE

## 2022-01-06 LAB — LYME DISEASE SEROLOGY W/REFLEX: Lyme Total Antibody EIA: NEGATIVE

## 2022-01-06 LAB — SEDIMENTATION RATE: Sed Rate: 10 mm/hr (ref 0–40)

## 2022-01-06 LAB — RPR: RPR Ser Ql: NONREACTIVE

## 2022-01-08 IMAGING — MG DIGITAL SCREENING BILAT W/ TOMO W/ CAD
6 of 10 series · 6 of 30 positions shown · non-contrast
Comparison: Previous exam(s).

CLINICAL DATA: Screening.

EXAM:
DIGITAL SCREENING BILATERAL MAMMOGRAM WITH TOMO AND CAD

[L MLO synth-2D]
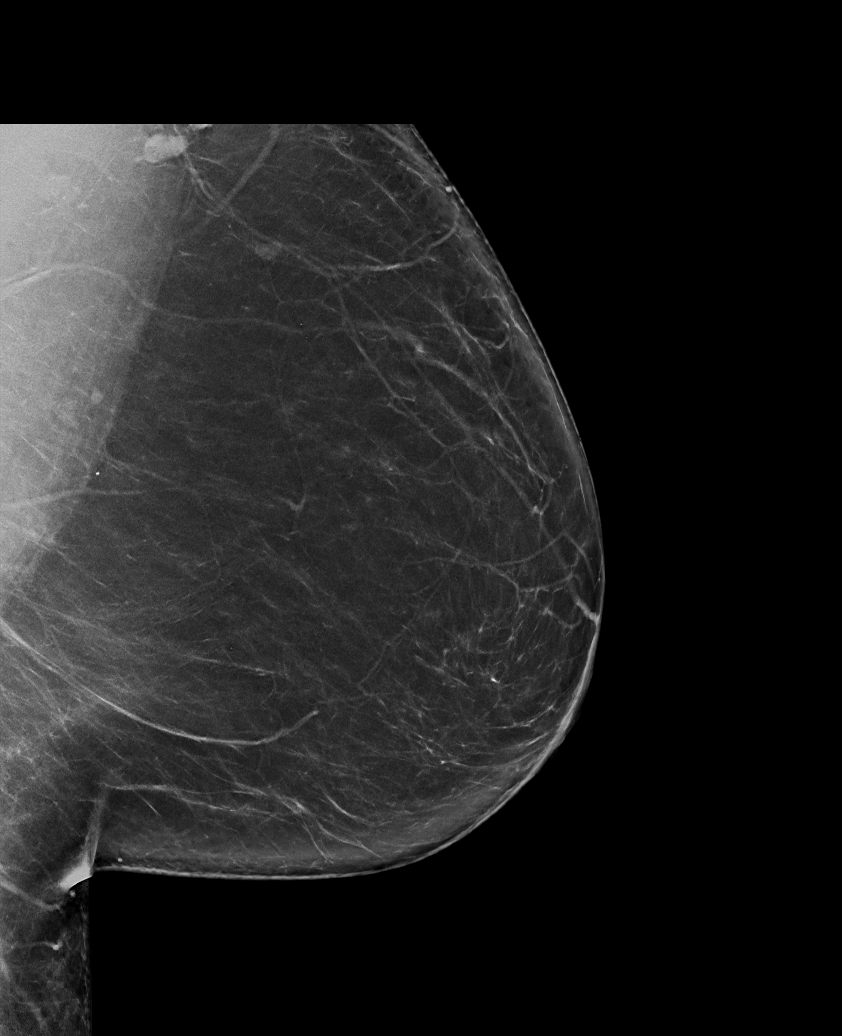

[R MLO synth-2D]
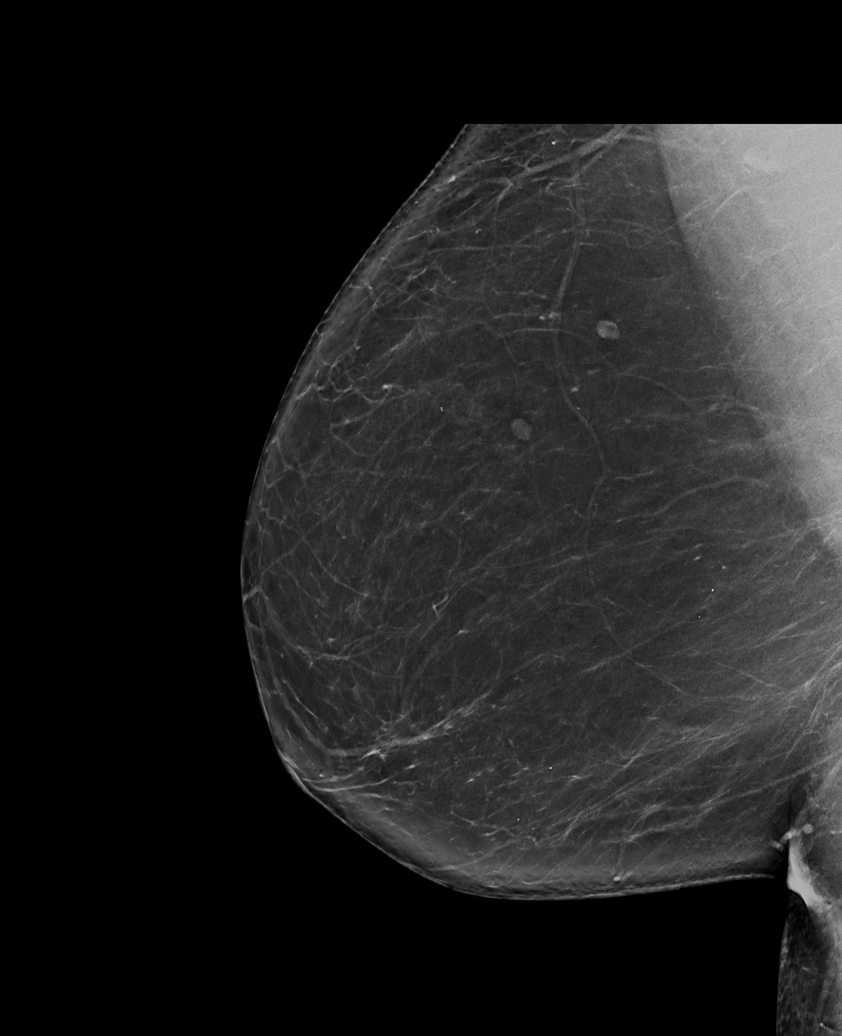

[L CC synth-2D (1 of 2)]
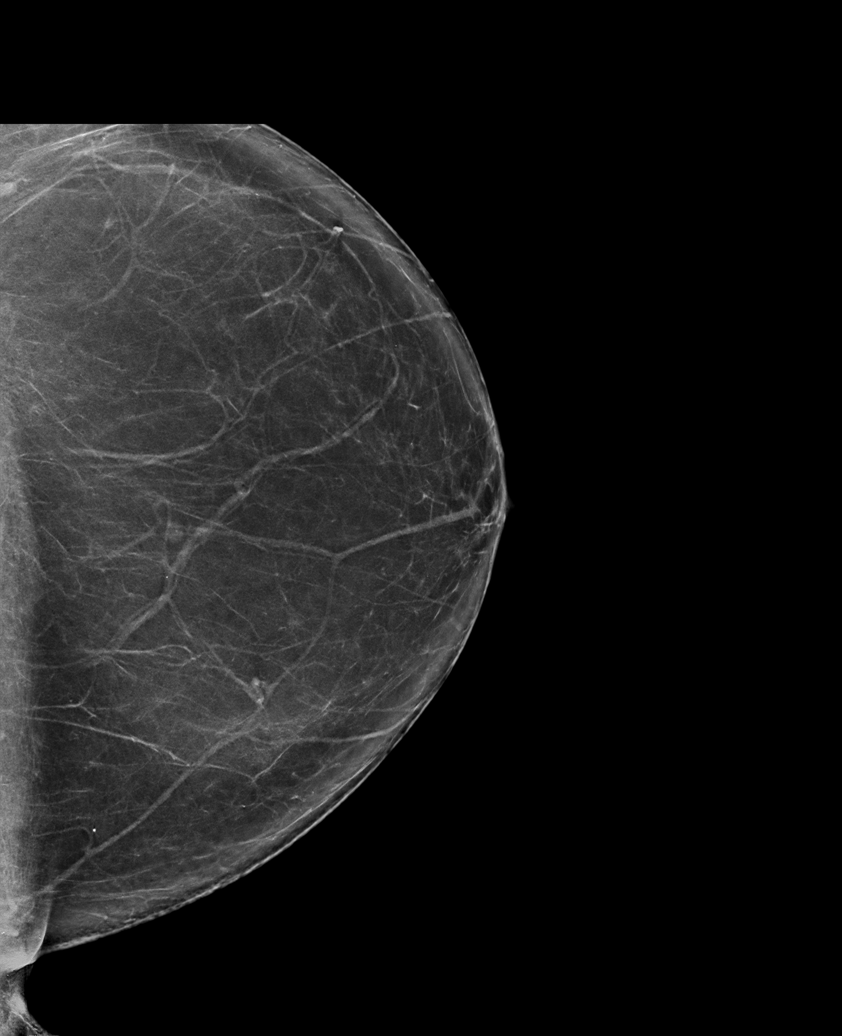

[R CC synth-2D]
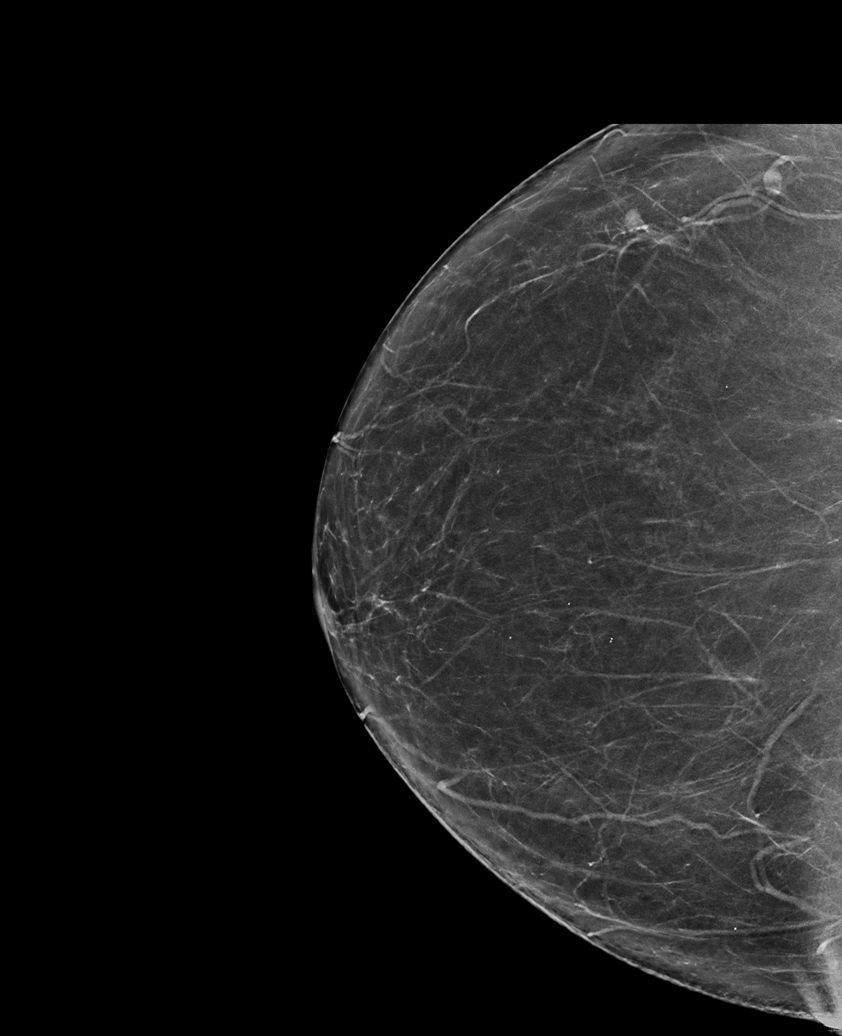

[L CC synth-2D (2 of 2)]
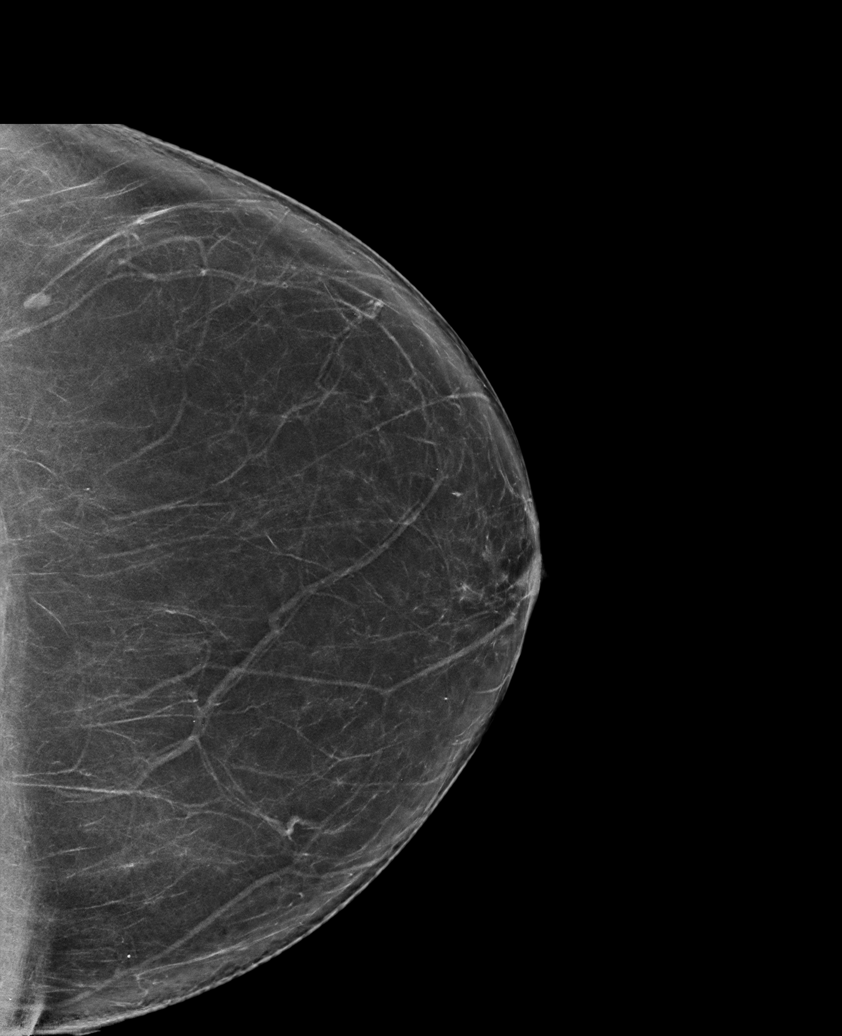

[L CC tomo · tomo slice 43/84.0]
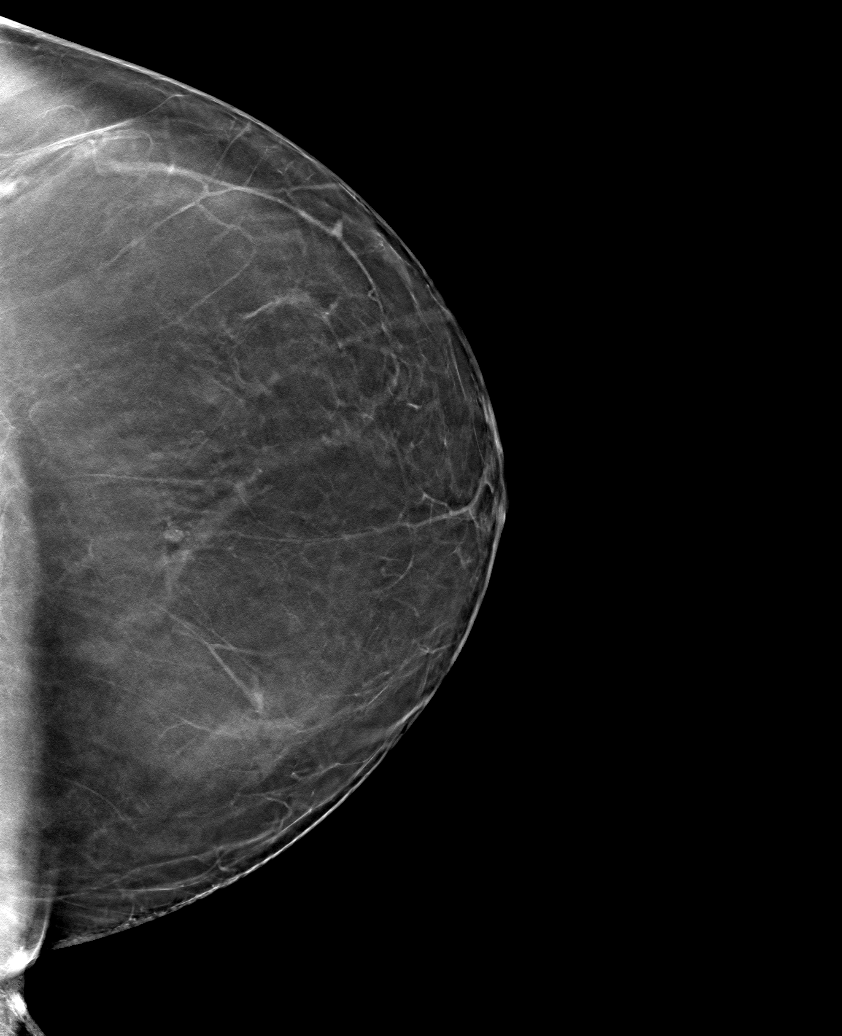

[6 of 30 positions shown; findings below may reference images not displayed]

ACR Breast Density Category b: There are scattered areas of
fibroglandular density.
FINDINGS: There are no findings suspicious for malignancy. Images were
processed with CAD.
IMPRESSION: No mammographic evidence of malignancy. A result letter of this
screening mammogram will be mailed directly to the patient.

RECOMMENDATION:
Screening mammogram in one year. (Code:CN-U-775)

BI-RADS CATEGORY  1: Negative.

## 2022-01-10 DIAGNOSIS — Z471 Aftercare following joint replacement surgery: Secondary | ICD-10-CM | POA: Diagnosis not present

## 2022-01-10 DIAGNOSIS — Z96641 Presence of right artificial hip joint: Secondary | ICD-10-CM | POA: Diagnosis not present

## 2022-01-10 DIAGNOSIS — M47818 Spondylosis without myelopathy or radiculopathy, sacral and sacrococcygeal region: Secondary | ICD-10-CM | POA: Diagnosis not present

## 2022-01-10 DIAGNOSIS — M1612 Unilateral primary osteoarthritis, left hip: Secondary | ICD-10-CM | POA: Diagnosis not present

## 2022-01-11 DIAGNOSIS — M25651 Stiffness of right hip, not elsewhere classified: Secondary | ICD-10-CM | POA: Diagnosis not present

## 2022-01-11 DIAGNOSIS — Z96641 Presence of right artificial hip joint: Secondary | ICD-10-CM | POA: Diagnosis not present

## 2022-01-11 DIAGNOSIS — Z7409 Other reduced mobility: Secondary | ICD-10-CM | POA: Diagnosis not present

## 2022-01-11 DIAGNOSIS — R29898 Other symptoms and signs involving the musculoskeletal system: Secondary | ICD-10-CM | POA: Diagnosis not present

## 2022-01-18 ENCOUNTER — Other Ambulatory Visit (HOSPITAL_COMMUNITY): Payer: Self-pay

## 2022-01-25 DIAGNOSIS — Z96641 Presence of right artificial hip joint: Secondary | ICD-10-CM | POA: Diagnosis not present

## 2022-01-25 DIAGNOSIS — Z7409 Other reduced mobility: Secondary | ICD-10-CM | POA: Diagnosis not present

## 2022-01-25 DIAGNOSIS — M25651 Stiffness of right hip, not elsewhere classified: Secondary | ICD-10-CM | POA: Diagnosis not present

## 2022-01-25 DIAGNOSIS — R29898 Other symptoms and signs involving the musculoskeletal system: Secondary | ICD-10-CM | POA: Diagnosis not present

## 2022-02-01 ENCOUNTER — Other Ambulatory Visit (HOSPITAL_COMMUNITY): Payer: Self-pay

## 2022-02-01 ENCOUNTER — Ambulatory Visit (INDEPENDENT_AMBULATORY_CARE_PROVIDER_SITE_OTHER): Payer: BC Managed Care – PPO | Admitting: Family Medicine

## 2022-02-01 ENCOUNTER — Encounter (INDEPENDENT_AMBULATORY_CARE_PROVIDER_SITE_OTHER): Payer: Self-pay | Admitting: Family Medicine

## 2022-02-01 VITALS — BP 108/71 | HR 71 | Temp 98.2°F | Ht 69.0 in | Wt 202.8 lb

## 2022-02-01 DIAGNOSIS — E559 Vitamin D deficiency, unspecified: Secondary | ICD-10-CM | POA: Diagnosis not present

## 2022-02-01 DIAGNOSIS — Z6829 Body mass index (BMI) 29.0-29.9, adult: Secondary | ICD-10-CM

## 2022-02-01 DIAGNOSIS — E669 Obesity, unspecified: Secondary | ICD-10-CM | POA: Diagnosis not present

## 2022-02-01 DIAGNOSIS — Z6833 Body mass index (BMI) 33.0-33.9, adult: Secondary | ICD-10-CM

## 2022-02-01 DIAGNOSIS — R7303 Prediabetes: Secondary | ICD-10-CM | POA: Diagnosis not present

## 2022-02-01 MED ORDER — VITAMIN D (ERGOCALCIFEROL) 1.25 MG (50000 UNIT) PO CAPS
50000.0000 [IU] | ORAL_CAPSULE | ORAL | 0 refills | Status: DC
  Filled 2022-02-01: qty 4, 28d supply, fill #0

## 2022-02-01 MED ORDER — ZEPBOUND 10 MG/0.5ML ~~LOC~~ SOAJ
10.0000 mg | SUBCUTANEOUS | 0 refills | Status: DC
  Filled 2022-02-01: qty 2, 28d supply, fill #0

## 2022-02-01 NOTE — Patient Instructions (Signed)
The 10-year ASCVD risk score (Arnett DK, et al., 2019) is: 1.8%   Values used to calculate the score:     Age: 57 years     Sex: Female     Is Non-Hispanic African American: Yes     Diabetic: No     Tobacco smoker: No     Systolic Blood Pressure: 081 mmHg     Is BP treated: No     HDL Cholesterol: 75 mg/dL     Total Cholesterol: 230 mg/dL

## 2022-02-02 ENCOUNTER — Telehealth (INDEPENDENT_AMBULATORY_CARE_PROVIDER_SITE_OTHER): Payer: Self-pay

## 2022-02-02 LAB — INSULIN, RANDOM: INSULIN: 18.2 u[IU]/mL (ref 2.6–24.9)

## 2022-02-02 LAB — HEMOGLOBIN A1C
Est. average glucose Bld gHb Est-mCnc: 108 mg/dL
Hgb A1c MFr Bld: 5.4 % (ref 4.8–5.6)

## 2022-02-02 LAB — VITAMIN D 25 HYDROXY (VIT D DEFICIENCY, FRACTURES): Vit D, 25-Hydroxy: 42.4 ng/mL (ref 30.0–100.0)

## 2022-02-02 NOTE — Telephone Encounter (Signed)
Denied. Covermymeds is faxing the reasoning to the office. We haven't received fax as of yet.

## 2022-02-02 NOTE — Telephone Encounter (Signed)
(  Key: [id number]) [name] 10MG /0.5ML pen-injectors Prior auth submitted thru covermymeds.

## 2022-02-06 ENCOUNTER — Telehealth: Payer: Self-pay | Admitting: Neurology

## 2022-02-06 NOTE — Telephone Encounter (Signed)
Pt is asking for a call re: the scheduling of her MRI 

## 2022-02-08 DIAGNOSIS — Z13220 Encounter for screening for lipoid disorders: Secondary | ICD-10-CM | POA: Diagnosis not present

## 2022-02-08 DIAGNOSIS — Z0000 Encounter for general adult medical examination without abnormal findings: Secondary | ICD-10-CM | POA: Diagnosis not present

## 2022-02-08 NOTE — Telephone Encounter (Signed)
I had to redo her [employer] authorization and I will call her this afternoon to get her scheduled

## 2022-02-09 NOTE — Telephone Encounter (Signed)
30 mins MR brain wo contrast Dr. Willette Pa Josem Kaufmann: [id number] exp. 02/08/22-03/09/22 scheduled at [facility] 02/14/22 at 9am. Patient is requesting anxiety meds for MRI and please send them to [id number] - [place], [place]

## 2022-02-09 NOTE — Telephone Encounter (Addendum)
Rx pended

## 2022-02-09 NOTE — Addendum Note (Signed)
Addended by: Verlin Grills on: 02/09/2022 03:28 PM   Modules accepted: Orders

## 2022-02-10 DIAGNOSIS — R29898 Other symptoms and signs involving the musculoskeletal system: Secondary | ICD-10-CM | POA: Diagnosis not present

## 2022-02-10 DIAGNOSIS — M25651 Stiffness of right hip, not elsewhere classified: Secondary | ICD-10-CM | POA: Diagnosis not present

## 2022-02-10 DIAGNOSIS — Z96641 Presence of right artificial hip joint: Secondary | ICD-10-CM | POA: Diagnosis not present

## 2022-02-10 DIAGNOSIS — Z7409 Other reduced mobility: Secondary | ICD-10-CM | POA: Diagnosis not present

## 2022-02-10 MED ORDER — ALPRAZOLAM 0.5 MG PO TABS: ORAL_TABLET | ORAL | 0 refills | Status: DC

## 2022-02-10 NOTE — Telephone Encounter (Signed)
Meds ordered this encounter  Medications   ALPRAZolam (XANAX) 0.5 MG tablet    Sig: Take 1-2 tablets 30 mins prior to MRI additional tablet can be taken at the time of the study. MUST HAVE A DRIVER    Dispense:  3 tablet    Refill:  0

## 2022-02-10 NOTE — Addendum Note (Signed)
Addended by: Marcial Pacas on: 02/10/2022 09:11 AM   Modules accepted: Orders

## 2022-02-14 ENCOUNTER — Ambulatory Visit: Payer: BC Managed Care – PPO

## 2022-02-14 DIAGNOSIS — R937 Abnormal findings on diagnostic imaging of other parts of musculoskeletal system: Secondary | ICD-10-CM

## 2022-02-14 DIAGNOSIS — R159 Full incontinence of feces: Secondary | ICD-10-CM | POA: Diagnosis not present

## 2022-02-14 DIAGNOSIS — R202 Paresthesia of skin: Secondary | ICD-10-CM

## 2022-02-15 ENCOUNTER — Other Ambulatory Visit (HOSPITAL_COMMUNITY): Payer: Self-pay

## 2022-02-15 ENCOUNTER — Other Ambulatory Visit (HOSPITAL_COMMUNITY): Payer: Self-pay | Admitting: Family Medicine

## 2022-02-15 DIAGNOSIS — R29898 Other symptoms and signs involving the musculoskeletal system: Secondary | ICD-10-CM | POA: Diagnosis not present

## 2022-02-15 DIAGNOSIS — Z7409 Other reduced mobility: Secondary | ICD-10-CM | POA: Diagnosis not present

## 2022-02-15 DIAGNOSIS — Z0000 Encounter for general adult medical examination without abnormal findings: Secondary | ICD-10-CM

## 2022-02-15 DIAGNOSIS — M25651 Stiffness of right hip, not elsewhere classified: Secondary | ICD-10-CM | POA: Diagnosis not present

## 2022-02-15 DIAGNOSIS — Z96641 Presence of right artificial hip joint: Secondary | ICD-10-CM | POA: Diagnosis not present

## 2022-02-17 NOTE — Progress Notes (Unsigned)
Chief Complaint:   OBESITY Sharon [name] is here to discuss her progress with her obesity treatment plan along with follow-up of her obesity related diagnoses. Sharon [name] is on keeping a food journal and adhering to recommended goals of 1400-1500 calories and 90 grams protein and states she is following her eating plan approximately 30% of the time. Sharon [name] states she is walking 30 minutes 3 times per week.  Today's visit was #: 43 Starting weight: 228 lbs Starting date: 10/30/2019 Today's weight: 202 lbs Today's date: 02/01/2022 Total lbs lost to date: 26 Total lbs lost since last in-office visit: +1  Interim History: Pt enjoyed the holidays. She wasn't able to stay on plan as much as she'd like, but is happy with 1 lb weight gain. Sharon [name] is healing well after hip surgery, and is not back to exercising like she would like to be. She did a lot of traveling over the holidays.  Subjective:   1. Prediabetes Sharon [name] denies bad cravings or excessive hunger. Symptoms are much better controlled with meds. She does skip meals at times.  2. Vitamin D deficiency Sharon [name] is tolerating medication(s) well without side effects.  Medication compliance is good as patient endorses taking it as prescribed.  Symptoms are stable and the patient denies additional concerns regarding this condition.    Assessment/Plan:   1. Prediabetes Sharon [name] will continue to work on weight loss, exercise, and decreasing simple carbohydrates to help decrease the risk of diabetes. Change from Ozempic to [name] 10 mg. Obtain fasting labs today.  - Hemoglobin A1c - Insulin, random  2. Vitamin D deficiency Low Vitamin D level contributes to fatigue and are associated with obesity, breast, and colon cancer. She agrees to continue to take prescription Vitamin D @50 ,000 IU every week and will follow-up for routine testing of Vitamin D, at least 2-3 times per year to avoid over-replacement. Increase exercise and strength training per  ortho. Obtain fasting labs today.  Refill- Vitamin D, Ergocalciferol, (DRISDOL) 1.25 MG (50000 UNIT) CAPS capsule; Take 1 capsule (50,000 Units total) by mouth every 7 (seven) days.  Dispense: 4 capsule; Refill: 0  - VITAMIN D 25 Hydroxy (Vit-D Deficiency, Fractures)  3. Obesity with current BMI of 29.9 Sharon [name] is currently in the action stage of change. As such, her goal is to continue with weight loss efforts. She has agreed to the Category 1 Plan or keeping a food journal and adhering to recommended goals of 1300-1400 calories and 85+ grams protein.   Sharon [name] could not find Wegovy at 2.4 mg, so we gave her 2 mg Ozempic. She tolerated it well and notes it helps with hunger and cravings. Pt desires a change. She will start [name] after much contemplation and discussion of risks.benefits. we also discussed costs and shortages.  Start- tirzepatide ([name]) 10 MG/0.5ML Pen; Inject 10 mg into the skin once a week.  Dispense: 2 mL; Refill: 0  Exercise goals: For substantial health benefits, adults should do at least 150 minutes (2 hours and 30 minutes) a week of moderate-intensity, or 75 minutes (1 hour and 15 minutes) a week of vigorous-intensity aerobic physical activity, or an equivalent combination of moderate- and vigorous-intensity aerobic activity. Aerobic activity should be performed in episodes of at least 10 minutes, and preferably, it should be spread throughout the week.  Behavioral modification strategies: planning for success and keeping a strict food journal.  Sharon [name] has agreed to follow-up with our clinic in 3-4 weeks. She was informed of the importance  of frequent follow-up visits to maximize her success with intensive lifestyle modifications for her multiple health conditions.   Sharon [name] was informed we would discuss her lab results at her next visit unless there is a critical issue that needs to be addressed sooner. Sharon [name] agreed to keep her next visit at the agreed upon time to discuss these  results.  Objective:   Blood pressure 108/71, pulse 71, temperature 98.2 F (36.8 C), height 5\' 9"  (1.753 m), weight 202 lb 12.8 oz (92 kg), last menstrual period 03/27/2013, SpO2 95 %. Body mass index is 29.95 kg/m.  General: Cooperative, alert, well developed, in no acute distress. HEENT: Conjunctivae and lids unremarkable. Cardiovascular: Regular rhythm.  Lungs: Normal work of breathing. Neurologic: No focal deficits.   Lab Results  Component Value Date   CREATININE 0.86 07/05/2021   BUN 21 07/05/2021   NA 144 07/05/2021   K 5.1 07/05/2021   CL 103 07/05/2021   CO2 27 07/05/2021   Lab Results  Component Value Date   ALT 17 07/05/2021   AST 17 07/05/2021   ALKPHOS 68 07/05/2021   BILITOT 0.3 07/05/2021   Lab Results  Component Value Date   HGBA1C 5.4 02/01/2022   HGBA1C 5.8 (H) 07/05/2021   HGBA1C 5.7 (H) 01/10/2021   HGBA1C 5.7 (H) 09/02/2020   HGBA1C 5.9 (H) 02/16/2020   Lab Results  Component Value Date   INSULIN 18.2 02/01/2022   INSULIN 12.6 07/05/2021   INSULIN 8.1 01/10/2021   INSULIN 14.4 09/02/2020   INSULIN 10.9 02/16/2020   Lab Results  Component Value Date   TSH 1.570 01/10/2021   Lab Results  Component Value Date   CHOL 230 (H) 07/05/2021   HDL 75 07/05/2021   LDLCALC 135 (H) 07/05/2021   TRIG 115 07/05/2021   CHOLHDL 3.1 07/05/2021   Lab Results  Component Value Date   VD25OH 42.4 02/01/2022   VD25OH 36.5 07/05/2021   VD25OH 54.2 01/10/2021   Lab Results  Component Value Date   WBC 5.4 01/10/2021   HGB 13.4 01/10/2021   HCT 39.9 01/10/2021   MCV 85 01/10/2021   PLT 245 01/10/2021   Attestation Statements:   Reviewed by clinician on day of visit: allergies, medications, problem list, medical history, surgical history, family history, social history, and previous encounter notes.  Time spent on visit including pre-visit chart review and post-visit care and charting was 40 minutes.   I, Kathlene November, BS, CMA, am acting as  transcriptionist for [employer], DO.  I have reviewed the above documentation for accuracy and completeness, and I agree with the above. -  ***

## 2022-02-20 ENCOUNTER — Encounter (INDEPENDENT_AMBULATORY_CARE_PROVIDER_SITE_OTHER): Payer: Self-pay | Admitting: Family Medicine

## 2022-02-21 ENCOUNTER — Other Ambulatory Visit (INDEPENDENT_AMBULATORY_CARE_PROVIDER_SITE_OTHER): Payer: Self-pay

## 2022-02-21 DIAGNOSIS — E669 Obesity, unspecified: Secondary | ICD-10-CM

## 2022-02-21 MED ORDER — ZEPBOUND 7.5 MG/0.5ML ~~LOC~~ SOAJ: 7.5000 mg | SUBCUTANEOUS | 0 refills | Status: DC

## 2022-02-22 ENCOUNTER — Ambulatory Visit (INDEPENDENT_AMBULATORY_CARE_PROVIDER_SITE_OTHER): Payer: BC Managed Care – PPO | Admitting: Family Medicine

## 2022-02-22 ENCOUNTER — Other Ambulatory Visit (HOSPITAL_COMMUNITY): Payer: Self-pay

## 2022-02-22 DIAGNOSIS — Z7409 Other reduced mobility: Secondary | ICD-10-CM | POA: Diagnosis not present

## 2022-02-22 DIAGNOSIS — M25651 Stiffness of right hip, not elsewhere classified: Secondary | ICD-10-CM | POA: Diagnosis not present

## 2022-02-22 DIAGNOSIS — R29898 Other symptoms and signs involving the musculoskeletal system: Secondary | ICD-10-CM | POA: Diagnosis not present

## 2022-02-22 DIAGNOSIS — Z96641 Presence of right artificial hip joint: Secondary | ICD-10-CM | POA: Diagnosis not present

## 2022-02-28 ENCOUNTER — Ambulatory Visit (HOSPITAL_COMMUNITY)
Admission: RE | Admit: 2022-02-28 | Discharge: 2022-02-28 | Disposition: A | Discharge status: Home / Self Care | Payer: BC Managed Care – PPO | Source: Ambulatory Visit | Attending: Family Medicine | Admitting: Family Medicine

## 2022-02-28 DIAGNOSIS — Z0000 Encounter for general adult medical examination without abnormal findings: Secondary | ICD-10-CM | POA: Insufficient documentation

## 2022-03-08 DIAGNOSIS — Z471 Aftercare following joint replacement surgery: Secondary | ICD-10-CM | POA: Diagnosis not present

## 2022-03-08 DIAGNOSIS — M461 Sacroiliitis, not elsewhere classified: Secondary | ICD-10-CM | POA: Diagnosis not present

## 2022-03-08 DIAGNOSIS — M1612 Unilateral primary osteoarthritis, left hip: Secondary | ICD-10-CM | POA: Diagnosis not present

## 2022-03-08 DIAGNOSIS — Z96641 Presence of right artificial hip joint: Secondary | ICD-10-CM | POA: Diagnosis not present

## 2022-03-15 ENCOUNTER — Encounter (INDEPENDENT_AMBULATORY_CARE_PROVIDER_SITE_OTHER): Payer: Self-pay | Admitting: Family Medicine

## 2022-03-15 ENCOUNTER — Ambulatory Visit (INDEPENDENT_AMBULATORY_CARE_PROVIDER_SITE_OTHER): Payer: BC Managed Care – PPO | Admitting: Family Medicine

## 2022-03-15 VITALS — BP 108/69 | HR 64 | Temp 98.0°F | Ht 69.0 in | Wt 206.2 lb

## 2022-03-15 DIAGNOSIS — E559 Vitamin D deficiency, unspecified: Secondary | ICD-10-CM | POA: Diagnosis not present

## 2022-03-15 DIAGNOSIS — Z6830 Body mass index (BMI) 30.0-30.9, adult: Secondary | ICD-10-CM | POA: Diagnosis not present

## 2022-03-15 DIAGNOSIS — E669 Obesity, unspecified: Secondary | ICD-10-CM | POA: Diagnosis not present

## 2022-03-15 DIAGNOSIS — R7303 Prediabetes: Secondary | ICD-10-CM | POA: Diagnosis not present

## 2022-03-15 MED ORDER — ZEPBOUND 10 MG/0.5ML ~~LOC~~ SOAJ: 10.0000 mg | SUBCUTANEOUS | 0 refills | Status: AC

## 2022-03-15 MED ORDER — VITAMIN D (ERGOCALCIFEROL) 1.25 MG (50000 UNIT) PO CAPS: 50000.0000 [IU] | ORAL_CAPSULE | ORAL | 0 refills | Status: DC

## 2022-03-20 ENCOUNTER — Telehealth (INDEPENDENT_AMBULATORY_CARE_PROVIDER_SITE_OTHER): Payer: Self-pay

## 2022-03-20 ENCOUNTER — Other Ambulatory Visit (INDEPENDENT_AMBULATORY_CARE_PROVIDER_SITE_OTHER): Payer: Self-pay | Admitting: Family Medicine

## 2022-03-20 DIAGNOSIS — E669 Obesity, unspecified: Secondary | ICD-10-CM

## 2022-03-20 NOTE — Telephone Encounter (Signed)
Prior auth submitted thru covermymeds. (Key: [id number]) Rx #: [id number] [name] '10MG'$ /0.5ML pen-injectors

## 2022-03-21 ENCOUNTER — Ambulatory Visit (INDEPENDENT_AMBULATORY_CARE_PROVIDER_SITE_OTHER): Payer: BC Managed Care – PPO | Admitting: Family Medicine

## 2022-03-21 NOTE — Telephone Encounter (Signed)
Denied: this rx is not a covered benefit per plan.

## 2022-03-29 NOTE — Progress Notes (Signed)
Chief Complaint:   OBESITY Sharon [name] is here to discuss her progress with her obesity treatment plan along with follow-up of her obesity related diagnoses. Sharon [name] is on keeping a food journal and adhering to recommended goals of 1300-1400 calories and 85+ protein and states she is following her eating plan approximately 0% of the time. Sharon [name] states she is playing tennis, doing Pilates, and dong weight training 45 minutes 4 times per week.  Today's visit was #: 18 Starting weight: 26 LBS Starting date: 10/30/2019 Today's weight: 206 LBS Today's date: 03/15/2022 Total lbs lost to date: 22 LBS Total lbs lost since last in-office visit: +4 LBS  Interim History: She is back to working out and feels great.  Still eating junk though.   Subjective:   1. Prediabetes Discussed labs with patient today. A1c improved at 5.4, prior 5.8.  Patient denies hunger but positive for some sugar cravings mostly after dinner.  2. Vitamin D deficiency Discussed labs with patient today. Positive for fatigue, patient denies nausea, vomiting, muscle weakness.  Last vitamin D level 42.4 and was not taking as prescribed regularly.  Assessment/Plan:  No orders of the defined types were placed in this encounter.   Medications Discontinued During This Encounter  Medication Reason   tirzepatide ([name]) 7.5 MG/0.5ML Pen    Vitamin D, Ergocalciferol, (DRISDOL) 1.25 MG (50000 UNIT) CAPS capsule Reorder     Meds ordered this encounter  Medications   Vitamin D, Ergocalciferol, (DRISDOL) 1.25 MG (50000 UNIT) CAPS capsule    Sig: Take 1 capsule (50,000 Units total) by mouth every 7 (seven) days.    Dispense:  4 capsule    Refill:  0    Ov for RF   tirzepatide ([name]) 10 MG/0.5ML Pen    Sig: Inject 10 mg into the skin once a week for 28 days.    Dispense:  2 mL    Refill:  0     1. Prediabetes Refill [name] and due to cravings increase to 10 mg weekly per patient request.  2. Vitamin D  deficiency Since patient has more recently has improved compliance we will stick with same dose and refill once weekly.  Refill- Vitamin D, Ergocalciferol, (DRISDOL) 1.25 MG (50000 UNIT) CAPS capsule; Take 1 capsule (50,000 Units total) by mouth every 7 (seven) days.  Dispense: 4 capsule; Refill: 0  3. Obesity with current BMI 30.5(start bmi 33.67) Bring in log of journaling.  Refill/increase- tirzepatide ([name]) 10 MG/0.5ML Pen; Inject 10 mg into the skin once a week for 28 days.  Dispense: 2 mL; Refill: 0  Sharon [name] is currently in the action stage of change. As such, her goal is to continue with weight loss efforts. She has agreed to keeping a food journal and adhering to recommended goals of 1300-1400 calories and 85+ protein.   Exercise goals:  As is.  Behavioral modification strategies: increasing lean protein intake, decreasing simple carbohydrates, and planning for success.  Sharon [name] has agreed to follow-up with our clinic in 5 weeks. She was informed of the importance of frequent follow-up visits to maximize her success with intensive lifestyle modifications for her multiple health conditions.   Objective:   Blood pressure 108/69, pulse 64, temperature 98 F (36.7 C), height '5\' 9"'$  (1.753 m), weight 206 lb 3.2 oz (93.5 kg), last menstrual period 03/27/2013, SpO2 97 %. Body mass index is 30.45 kg/m.  General: Cooperative, alert, well developed, in no acute distress. HEENT: Conjunctivae and lids unremarkable. Cardiovascular: Regular rhythm.  Lungs: Normal work of breathing. Neurologic: No focal deficits.   Lab Results  Component Value Date   CREATININE 0.86 07/05/2021   BUN 21 07/05/2021   NA 144 07/05/2021   K 5.1 07/05/2021   CL 103 07/05/2021   CO2 27 07/05/2021   Lab Results  Component Value Date   ALT 17 07/05/2021   AST 17 07/05/2021   ALKPHOS 68 07/05/2021   BILITOT 0.3 07/05/2021   Lab Results  Component Value Date   HGBA1C 5.4 02/01/2022   HGBA1C 5.8 (H)  07/05/2021   HGBA1C 5.7 (H) 01/10/2021   HGBA1C 5.7 (H) 09/02/2020   HGBA1C 5.9 (H) 02/16/2020   Lab Results  Component Value Date   INSULIN 18.2 02/01/2022   INSULIN 12.6 07/05/2021   INSULIN 8.1 01/10/2021   INSULIN 14.4 09/02/2020   INSULIN 10.9 02/16/2020   Lab Results  Component Value Date   TSH 1.570 01/10/2021   Lab Results  Component Value Date   CHOL 230 (H) 07/05/2021   HDL 75 07/05/2021   LDLCALC 135 (H) 07/05/2021   TRIG 115 07/05/2021   CHOLHDL 3.1 07/05/2021   Lab Results  Component Value Date   VD25OH 42.4 02/01/2022   VD25OH 36.5 07/05/2021   VD25OH 54.2 01/10/2021   Lab Results  Component Value Date   WBC 5.4 01/10/2021   HGB 13.4 01/10/2021   HCT 39.9 01/10/2021   MCV 85 01/10/2021   PLT 245 01/10/2021   No results found for: "IRON", "TIBC", "FERRITIN"  Attestation Statements:   Reviewed by clinician on day of visit: allergies, medications, problem list, medical history, surgical history, family history, social history, and previous encounter notes.  I, Davy Pique, RMA, am acting as Location manager for [employer], DO.   I have reviewed the above documentation for accuracy and completeness, and I agree with the above. Marjory Sneddon, D.O.  The [place] was signed into law in 2016 which includes the topic of electronic health records.  This provides immediate access to information in MyChart.  This includes consultation notes, operative notes, office notes, lab results and pathology reports.  If you have any questions about what you read please let us know at your next visit so we can discuss your concerns and take corrective action if need be.  We are right here with you.

## 2022-04-20 ENCOUNTER — Ambulatory Visit (INDEPENDENT_AMBULATORY_CARE_PROVIDER_SITE_OTHER): Payer: BC Managed Care – PPO | Admitting: Internal Medicine

## 2022-04-20 ENCOUNTER — Encounter (INDEPENDENT_AMBULATORY_CARE_PROVIDER_SITE_OTHER): Payer: Self-pay | Admitting: Internal Medicine

## 2022-04-20 VITALS — BP 110/72 | HR 65 | Temp 98.3°F | Ht 69.0 in | Wt 201.0 lb

## 2022-04-20 DIAGNOSIS — Z6836 Body mass index (BMI) 36.0-36.9, adult: Secondary | ICD-10-CM

## 2022-04-20 DIAGNOSIS — E559 Vitamin D deficiency, unspecified: Secondary | ICD-10-CM | POA: Diagnosis not present

## 2022-04-20 MED ORDER — ZEPBOUND 10 MG/0.5ML ~~LOC~~ SOAJ: 10.0000 mg | SUBCUTANEOUS | 0 refills | Status: DC

## 2022-04-20 MED ORDER — VITAMIN D (ERGOCALCIFEROL) 1.25 MG (50000 UNIT) PO CAPS: 50000.0000 [IU] | ORAL_CAPSULE | ORAL | 0 refills | Status: DC

## 2022-04-20 NOTE — Progress Notes (Signed)
Office: [phone]  /  Fax: [phone]  WEIGHT SUMMARY AND BIOMETRICS  Vitals Temp: 98.3 F (36.8 C) BP: 110/72 Pulse Rate: 65 SpO2: 98 %   Anthropometric Measurements Height: 5\' 9"  (1.753 m) Weight: 201 lb (91.2 kg) BMI (Calculated): 29.67 Weight at Last Visit: 206 lb Weight Lost Since Last Visit: 5 lb Starting Weight: 228 lb Total Weight Loss (lbs): 27 lb (12.2 kg)   Body Composition  Body Fat %: 36.5 % Fat Mass (lbs): 73.6 lbs Muscle Mass (lbs): 121.6 lbs Total Body Water (lbs): 71.6 lbs Visceral Fat Rating : 9    HPI  Chief Complaint: OBESITY  Sharon [name] is here to discuss her progress with her obesity treatment plan. She is on the the Category 2 Plan and states she is following her eating plan approximately 30 % of the time. She states she is exercising 60 minutes 5 times per week.  Interval History:  This is my first encounter with Ms. Loftin. Since last office visit she has lost 5 lbs. Has increased exercise tennis 3-4 x a week, pilates 4 times a week.  Acknowledges still eating sweets from time to time but not as much.  She has been eating snacks size Snickers.  She is now on [name] 10 mg a week without any adverse effects.  She notes increase satiety and decrease hunger signals.  She still has cravings for sweets She reports fair adherence to reduced calorie nutritional plan. She has been working on not skipping meals and increasing protein at every meal [x] Denies [] Reports problems with appetite and hunger signals.  [x] Denies [] Reports problems with satiety and satiation.  [x] Denies [] Reports problems with eating patterns and portion control.  [] Denies [x] Reports abnormal cravings   Barriers identified none, predilection for convenience, and strong hunger signals and food noise .   Pharmacotherapy for weight loss: She is currently taking [name] without adverse effects.   ASSESSMENT AND PLAN  TREATMENT PLAN FOR OBESITY:  Recommended Dietary  Goals  Jelene is currently in the action stage of change. As such, her goal is to continue weight management plan. She has agreed to: continue current plan and reduce simple sugars from diet  Behavioral Intervention  We discussed the following Behavioral Modification Strategies today: increasing lean protein intake, decreasing simple carbohydrates , increasing vegetables, increasing fiber rich foods, avoiding skipping meals, increasing water intake, work on meal planning and easy cooking plans, reading food labels , planning for success, and keeping healthy foods at home.  Additional resources provided today: Handout on healthy eating and balanced plate  Recommended Physical Activity Goals  Makenzey has been advised to work up to 150 minutes of moderate intensity aerobic activity a week and strengthening exercises 2-3 times per week for cardiovascular health, weight loss maintenance and preservation of muscle mass.   She has agreed to :  Continue current level of physical activity   Pharmacotherapy We discussed various medication options to help Cedrika with her weight loss efforts and we both agreed to : continue current anti-obesity medication regimen.  Patient counseled on medication side effects, the importance of following reduced calorie nutrition plan and also avoiding large portions or fatty meals to avoid GI distress.  We also emphasized the importance of not skipping meals and getting adequate protein to prevent muscle loss.  ASSOCIATED CONDITIONS ADDRESSED TODAY  Class 2 severe obesity with serious comorbidity and body mass index (BMI) of 36.0 to 36.9 in adult, unspecified obesity type ([place]) -     [name]; Inject 10  mg into the skin once a week.  Dispense: 2 mL; Refill: 0  Vitamin D deficiency -     Vitamin D (Ergocalciferol); Take 1 capsule (50,000 Units total) by mouth every 7 (seven) days.  Dispense: 4 capsule; Refill: 0     PHYSICAL EXAM:  Blood pressure 110/72, pulse 65,  temperature 98.3 F (36.8 C), height 5\' 9"  (1.753 m), weight 201 lb (91.2 kg), last menstrual period 03/27/2013, SpO2 98 %. Body mass index is 29.68 kg/m.  General: She is overweight, cooperative, alert, well developed, and in no acute distress. PSYCH: Has normal mood, affect and thought process.   HEENT: EOMI, sclerae are anicteric. Lungs: Normal breathing effort, no conversational dyspnea. Extremities: No edema.  Neurologic: No gross sensory or motor deficits. No tremors or fasciculations noted.    DIAGNOSTIC DATA REVIEWED:  BMET    Component Value Date/Time   NA 144 07/05/2021 1103   K 5.1 07/05/2021 1103   CL 103 07/05/2021 1103   CO2 27 07/05/2021 1103   GLUCOSE 99 07/05/2021 1103   GLUCOSE 106 (H) 05/13/2019 0840   BUN 21 07/05/2021 1103   CREATININE 0.86 07/05/2021 1103   CALCIUM 9.8 07/05/2021 1103   GFRNONAA 75 02/16/2020 1252   GFRAA 86 02/16/2020 1252   Lab Results  Component Value Date   HGBA1C 5.4 02/01/2022   HGBA1C 6.1 (H) 10/30/2019   Lab Results  Component Value Date   INSULIN 18.2 02/01/2022   INSULIN 20.2 10/30/2019   Lab Results  Component Value Date   TSH 1.570 01/10/2021   CBC    Component Value Date/Time   WBC 5.4 01/10/2021 0917   WBC 5.5 05/13/2019 0840   RBC 4.69 01/10/2021 0917   RBC 4.72 05/13/2019 0840   HGB 13.4 01/10/2021 0917   HCT 39.9 01/10/2021 0917   PLT 245 01/10/2021 0917   MCV 85 01/10/2021 0917   MCH 28.6 01/10/2021 0917   MCH 28.4 05/13/2019 0840   MCHC 33.6 01/10/2021 0917   MCHC 31.8 05/13/2019 0840   RDW 13.4 01/10/2021 0917   Iron Studies No results found for: "IRON", "TIBC", "FERRITIN", "IRONPCTSAT" Lipid Panel     Component Value Date/Time   CHOL 230 (H) 07/05/2021 1103   TRIG 115 07/05/2021 1103   HDL 75 07/05/2021 1103   CHOLHDL 3.1 07/05/2021 1103   LDLCALC 135 (H) 07/05/2021 1103   Hepatic Function Panel     Component Value Date/Time   PROT 7.0 01/04/2022 0930   ALBUMIN 4.5 07/05/2021 1103    AST 17 07/05/2021 1103   ALT 17 07/05/2021 1103   ALKPHOS 68 07/05/2021 1103   BILITOT 0.3 07/05/2021 1103      Component Value Date/Time   TSH 1.570 01/10/2021 0917   Nutritional Lab Results  Component Value Date   VD25OH 42.4 02/01/2022   VD25OH 36.5 07/05/2021   VD25OH 54.2 01/10/2021     Return in about 4 weeks (around 05/18/2022) for Dr. Jenetta Downer.. She was informed of the importance of frequent follow up visits to maximize her success with intensive lifestyle modifications for her multiple health conditions.   ATTESTASTION STATEMENTS:  Reviewed by clinician on day of visit: allergies, medications, problem list, medical history, surgical history, family history, social history, and previous encounter notes.     Thomes Dinning, MD

## 2022-04-20 NOTE — Assessment & Plan Note (Signed)
Most recent vitamin D levels  Lab Results  Component Value Date   VD25OH 42.4 02/01/2022   VD25OH 36.5 07/05/2021   VD25OH 54.2 01/10/2021     Deficiency state associated with adiposity and may result in leptin resistance, weight gain and fatigue. Currently on vitamin D supplementation without any adverse effects.  Plan: Consider switching to over-the-counter supplementation if feasible.

## 2022-05-15 DIAGNOSIS — Z139 Encounter for screening, unspecified: Secondary | ICD-10-CM | POA: Diagnosis not present

## 2022-05-15 DIAGNOSIS — Z01419 Encounter for gynecological examination (general) (routine) without abnormal findings: Secondary | ICD-10-CM | POA: Diagnosis not present

## 2022-05-23 ENCOUNTER — Encounter (INDEPENDENT_AMBULATORY_CARE_PROVIDER_SITE_OTHER): Payer: Self-pay | Admitting: Family Medicine

## 2022-05-23 ENCOUNTER — Ambulatory Visit (INDEPENDENT_AMBULATORY_CARE_PROVIDER_SITE_OTHER): Payer: BC Managed Care – PPO | Admitting: Family Medicine

## 2022-05-23 VITALS — BP 103/49 | HR 87 | Temp 98.0°F | Ht 69.0 in | Wt 205.0 lb

## 2022-05-23 DIAGNOSIS — R7303 Prediabetes: Secondary | ICD-10-CM | POA: Diagnosis not present

## 2022-05-23 DIAGNOSIS — Z6836 Body mass index (BMI) 36.0-36.9, adult: Secondary | ICD-10-CM | POA: Diagnosis not present

## 2022-05-23 DIAGNOSIS — E559 Vitamin D deficiency, unspecified: Secondary | ICD-10-CM | POA: Diagnosis not present

## 2022-05-23 MED ORDER — ZEPBOUND 10 MG/0.5ML ~~LOC~~ SOAJ: 10.0000 mg | SUBCUTANEOUS | 0 refills | Status: DC

## 2022-05-23 MED ORDER — VITAMIN D (ERGOCALCIFEROL) 1.25 MG (50000 UNIT) PO CAPS: 50000.0000 [IU] | ORAL_CAPSULE | ORAL | 0 refills | Status: DC

## 2022-05-23 NOTE — Progress Notes (Signed)
Sharon [name], D.O.  ABFM, ABOM Specializing in Clinical Bariatric Medicine  Office located at: [address] [place], [place]  [address]     Assessment and Plan:   No orders of the defined types were placed in this encounter.   Medications Discontinued During This Encounter  Medication Reason   tirzepatide ([name]) 10 MG/0.5ML Pen Reorder   Vitamin D, Ergocalciferol, (DRISDOL) 1.25 MG (50000 UNIT) CAPS capsule Reorder   tirzepatide ([name]) 10 MG/0.5ML Pen Reorder     Meds ordered this encounter  Medications   Vitamin D, Ergocalciferol, (DRISDOL) 1.25 MG (50000 UNIT) CAPS capsule    Sig: Take 1 capsule (50,000 Units total) by mouth every 7 (seven) days.    Dispense:  4 capsule    Refill:  0    Ov for RF   DISCONTD: tirzepatide ([name]) 10 MG/0.5ML Pen    Sig: Inject 10 mg into the skin once a week.    Dispense:  2 mL    Refill:  0   tirzepatide ([name]) 10 MG/0.5ML Pen    Sig: Inject 10 mg into the skin once a week.    Dispense:  2 mL    Refill:  0    Check fasting labs (CMP, Vit D, A1c, Insulin, and FLP) next OV  Prediabetes Assessment: Condition is improving, but not optimized.   Lab Results  Component Value Date   HGBA1C 5.4 02/01/2022   HGBA1C 5.8 (H) 07/05/2021   HGBA1C 5.7 (H) 01/10/2021   INSULIN 18.2 02/01/2022   INSULIN 12.6 07/05/2021   INSULIN 8.1 01/10/2021  - [name] is not on any pre-diabetic medication. This is diet controlled.  - Her hunger and cravings are controlled when eating on plan.   Plan:  - Continue to decrease simple carbs/ sugars; increase fiber and proteins -> follow her meal plan.   [name] will continue to work on weight loss, exercise, via their meal plan we devised to help decrease the risk of progressing to diabetes.    Vitamin D deficiency Assessment: Condition is Not optimized. Lab Results  Component Value Date   VD25OH 42.4 02/01/2022   VD25OH 36.5 07/05/2021   VD25OH 54.2 01/10/2021  - No issues with  Ergocalciferol 50K IU weekly. Denies any side.   Plan: - Continue with med. Will refill this today.   - Continue her prudent nutritional plan that is low in simple carbohydrates, saturated fats and trans fats to goal of 5-10% weight loss to achieve significant health benefits.  Pt encouraged to continually advance exercise and cardiovascular fitness as tolerated throughout weight loss journey.    TREATMENT PLAN FOR OBESITY: Class 2 severe obesity with serious comorbidity and body mass index (BMI) of 36.0 to 36.9 in adult, unspecified obesity type [facility]) Assessment: Condition is not optimized. Biometric data collected today, was reviewed with patient.  Fat mass has increased by 6 lb. Muscle mass has decreased by 2 lb. Total body water has increased by 6.8 lb.  - She has been off [name] for approximately 3 weeks because she was unable to obtain the medication. - She endorses that her hunger is the same and she is eating less sweets.   Plan:  - Will refill this today.  I recommended her to try [employer], [employer], [employer] etc to find the [name].  [name] is currently in the action stage of change. As such, her goal is to continue weight management plan. [name] will work on healthier eating habits and  continue the Category 2 meal plan.  - I discussed ways that [name] can increase her protein intake. For example, she can prepare breakfast Casseroles or Pork Carnitas with salsa. - I recommended her to have 30 grams of protein in each meal.   Behavioral Intervention Additional resources provided today: slow cooker meal options handout Evidence-based interventions for health behavior change were utilized today including the discussion of self monitoring techniques, problem-solving barriers and SMART goal setting techniques.   Regarding patient's less desirable eating habits and patterns, we employed the technique of small changes.  Pt will specifically work on: continue prudent nutritional plan and  have 30 grams of protein in each meal for next visit.    Recommended Physical Activity Goals [name] has been advised to work up to 150 minutes of moderate intensity aerobic activity a week and strengthening exercises 2-3 times per week for cardiovascular health, weight loss maintenance and preservation of muscle mass.  She has agreed to Continue current level of physical activity    FOLLOW UP: Return in about 3 weeks (around 06/13/2022). She was informed of the importance of frequent follow up visits to maximize her success with intensive lifestyle modifications for her multiple health conditions.  Subjective:   Chief complaint: Obesity Shaleta is here to discuss her progress with her obesity treatment plan. She is on  the Category 2 Plan and states she is following her eating plan approximately 30% of the time. She states she is exercising (pilates tennis, and weights)  60 minutes 5-6 days per week.  Interval History:  [name] is here for a follow up office visit.  Since last office visit: - She has been too [place] 3 times since visiting me. During that time, she was eating out a lot more.  - She endorses that it is difficult to eat all the protein on her meal plan.   We reviewed her meal plan and all questions were answered. Patient's food recall appears to be accurate and consistent with what is on plan when she is following it. When eating on plan, her hunger and cravings are well controlled.      Pharmacotherapy for weight loss: She is currently taking  [name]  for medical weight loss.  Denies side effects.    Review of Systems:  Pertinent positives were addressed with patient today.   Weight Summary and Biometrics   Weight Lost Since Last Visit: 0  Weight Gained Since Last Visit: 4 lb    Vitals Temp: 98 F (36.7 C) BP: (!) 103/49 Pulse Rate: 87 SpO2: 98 %   Anthropometric Measurements Height: 5\' 9"  (1.753 m) Weight: 205 lb (93 kg) BMI (Calculated):  30.26 Weight at Last Visit: 201 lb Weight Lost Since Last Visit: 0 Weight Gained Since Last Visit: 4 lb Starting Weight: 228 lb Total Weight Loss (lbs): 23 lb (10.4 kg)   Body Composition  Body Fat %: 38.7 % Fat Mass (lbs): 79.6 lbs Muscle Mass (lbs): 119.6 lbs Total Body Water (lbs): 78.4 lbs Visceral Fat Rating : 9   Other Clinical Data Fasting: No Labs: No Today's Visit #: 34 Starting Date: 10/30/19     Objective:   PHYSICAL EXAM: Blood pressure (!) 103/49, pulse 87, temperature 98 F (36.7 C), height 5\' 9"  (1.753 m), weight 205 lb (93 kg), last menstrual period 03/27/2013, SpO2 98 %. Body mass index is 30.27 kg/m.  General: Well Developed, well nourished, and in no acute distress.  HEENT: Normocephalic, atraumatic Skin: Warm and  dry, cap RF less 2 sec, good turgor Chest:  Normal excursion, shape, no gross abn Respiratory: speaking in full sentences, no conversational dyspnea NeuroM-Sk: Ambulates w/o assistance, moves * 4 Psych: A and O *3, insight good, mood-full  DIAGNOSTIC DATA REVIEWED:  BMET    Component Value Date/Time   NA 144 07/05/2021 1103   K 5.1 07/05/2021 1103   CL 103 07/05/2021 1103   CO2 27 07/05/2021 1103   GLUCOSE 99 07/05/2021 1103   GLUCOSE 106 (H) 05/13/2019 0840   BUN 21 07/05/2021 1103   CREATININE 0.86 07/05/2021 1103   CALCIUM 9.8 07/05/2021 1103   GFRNONAA 75 02/16/2020 1252   GFRAA 86 02/16/2020 1252   Lab Results  Component Value Date   HGBA1C 5.4 02/01/2022   HGBA1C 6.1 (H) 10/30/2019   Lab Results  Component Value Date   INSULIN 18.2 02/01/2022   INSULIN 20.2 10/30/2019   Lab Results  Component Value Date   TSH 1.570 01/10/2021   CBC    Component Value Date/Time   WBC 5.4 01/10/2021 0917   WBC 5.5 05/13/2019 0840   RBC 4.69 01/10/2021 0917   RBC 4.72 05/13/2019 0840   HGB 13.4 01/10/2021 0917   HCT 39.9 01/10/2021 0917   PLT 245 01/10/2021 0917   MCV 85 01/10/2021 0917   MCH 28.6 01/10/2021 0917   MCH  28.4 05/13/2019 0840   MCHC 33.6 01/10/2021 0917   MCHC 31.8 05/13/2019 0840   RDW 13.4 01/10/2021 0917   Iron Studies No results found for: "IRON", "TIBC", "FERRITIN", "IRONPCTSAT" Lipid Panel     Component Value Date/Time   CHOL 230 (H) 07/05/2021 1103   TRIG 115 07/05/2021 1103   HDL 75 07/05/2021 1103   CHOLHDL 3.1 07/05/2021 1103   LDLCALC 135 (H) 07/05/2021 1103   Hepatic Function Panel     Component Value Date/Time   PROT 7.0 01/04/2022 0930   ALBUMIN 4.5 07/05/2021 1103   AST 17 07/05/2021 1103   ALT 17 07/05/2021 1103   ALKPHOS 68 07/05/2021 1103   BILITOT 0.3 07/05/2021 1103      Component Value Date/Time   TSH 1.570 01/10/2021 0917   Nutritional Lab Results  Component Value Date   VD25OH 42.4 02/01/2022   VD25OH 36.5 07/05/2021   VD25OH 54.2 01/10/2021    Attestations:   Reviewed by clinician on day of visit: allergies, medications, problem list, medical history, surgical history, family history, social history, and previous encounter notes.    I,Special Puri,acting as a Neurosurgeon for [employer], DO.,have documented all relevant documentation on the behalf of Thomasene Lot, DO,as directed by  Thomasene Lot, DO while in the presence of Thomasene Lot, DO.   I, Thomasene Lot, DO, have reviewed all documentation for this visit. The documentation on 05/23/22 for the exam, diagnosis, procedures, and orders are all accurate and complete.

## 2022-05-25 ENCOUNTER — Telehealth (INDEPENDENT_AMBULATORY_CARE_PROVIDER_SITE_OTHER): Payer: Self-pay | Admitting: Family Medicine

## 2022-05-25 NOTE — Telephone Encounter (Signed)
Prior authorization done via cover my meds for patients [name]. Waiting on determination.   

## 2022-05-30 NOTE — Telephone Encounter (Signed)
Prior authorization approved for patients [name].  

## 2022-06-12 ENCOUNTER — Encounter: Payer: Self-pay | Admitting: Neurology

## 2022-06-12 ENCOUNTER — Ambulatory Visit (INDEPENDENT_AMBULATORY_CARE_PROVIDER_SITE_OTHER): Payer: BC Managed Care – PPO | Admitting: Neurology

## 2022-06-12 VITALS — BP 110/71 | HR 69 | Ht 69.0 in | Wt 207.4 lb

## 2022-06-12 DIAGNOSIS — R202 Paresthesia of skin: Secondary | ICD-10-CM

## 2022-06-12 NOTE — Progress Notes (Signed)
Chief Complaint  Patient presents with   Follow-up    Rm 16. Patient alone. Still reports numbness in feet, feels more abnormal than numb sometimes, but still feels off.       ASSESSMENT AND PLAN  Sharon [name] is a 57 y.o. female   Bilateral toes paresthesia, intermittent bowel incontinence  EMG nerve conduction study in December 2023 showed normal large fiber peripheral neuropathy  Neuraxis showed no significant structural abnormality, only mild symptomatic, we will continue observe her symptoms only return to clinic for worsening   DIAGNOSTIC DATA (LABS, IMAGING, TESTING) - I reviewed patient records, labs, notes, testing and imaging myself where available.   MEDICAL HISTORY:  Sharon [name] is a 57 year old female, seen in request by neurosurgeon Dr. Hoyt Koch for evaluation of toes paresthesia  I reviewed and summarized the referring note. PMhx. Pre-DM  She has been physically active, plays tennis regularly, noticed some right hip pain,  On August 16, 2021, while traveling outside, she woke up noticed bilateral toes cold sensation, as if they were in the snow, then she realized they were not, heavy, swollen sensation, also noticed perineal numbness when she wiped herself, she felt there was a tissue start there, she also had an episode of bowel incontinence, her perineal area abnormal sensation last until  August 30, 2016 2023, she denies recurrent bowel and bladder incontinence  She still have abnormal sensation at her toes, there is something stuck in the ball of her feet, she has some gait abnormality, attributed to her right hip pain, denies bilateral hands paresthesia   Personally reviewed MRI of lumbar August 26, 2021, multilevel degenerative changes, most noticeable at L5-S1, moderate foraminal stenosis bilaterally,  UPDATE Jun 12 2022: She has intermittent bilateral feet numbness, not painful, no limitation in her daily activity  Personally  reviewed MRI of the brain January 2024, that was normal  MRI of cervical spine, mild degenerative changes, no significant canal stenosis, C5-6 moderately severe left foraminal narrowing,  MRI of thoracic spine no significant abnormality MRI of lumbar spine, mild degenerative changes, no significant canal foraminal narrowing  PHYSICAL EXAM:   Vitals:   06/12/22 1003  BP: 110/71  Pulse: 69  Weight: 207 lb 6.4 oz (94.1 kg)  Height: 5\' 9"  (1.753 m)   Not recorded     Body mass index is 30.63 kg/m.  PHYSICAL EXAMNIATION:  Gen: NAD, conversant, well nourised, well groomed                     Cardiovascular: Regular rate rhythm, no peripheral edema, warm, nontender. Eyes: Conjunctivae clear without exudates or hemorrhage Neck: Supple, no carotid bruits. Pulmonary: Clear to auscultation bilaterally   NEUROLOGICAL EXAM:  MENTAL STATUS: Speech/cognition: Awake, alert, oriented to history taking and casual conversation CRANIAL NERVES: CN II: Visual fields are full to confrontation. Pupils are round equal and briskly reactive to light. CN III, IV, VI: extraocular movement are normal. No ptosis. CN V: Facial sensation is intact to light touch CN VII: Face is symmetric with normal eye closure  CN VIII: Hearing is normal to causal conversation. CN IX, X: Phonation is normal. CN XI: Head turning and shoulder shrug are intact  MOTOR: There is no pronator drift of out-stretched arms. Muscle bulk and tone are normal. Muscle strength is normal.  REFLEXES: Reflexes are 1  and symmetric at the biceps, triceps, 2/2 knees, and ankles. Plantar responses are flexor.  SENSORY: Intact to light touch, pinprick and  vibratory sensation are intact in fingers and toes.  COORDINATION: There is no trunk or limb dysmetria noted.  GAIT/STANCE: Mildly antalgic due to right hip pain  REVIEW OF SYSTEMS:  Full 14 system review of systems performed and notable only for as above All other review of  systems were negative.   ALLERGIES: No Known Allergies  HOME MEDICATIONS: Current Outpatient Medications  Medication Sig Dispense Refill   celecoxib (CELEBREX) 200 MG capsule Take by mouth.     ibuprofen (ADVIL) 200 MG tablet Take 400 mg by mouth every 6 (six) hours as needed for moderate pain.     pantoprazole (PROTONIX) 20 MG tablet Take 1 tablet (20 mg total) by mouth daily. 30 tablet 0   tirzepatide ([name]) 10 MG/0.5ML Pen Inject 10 mg into the skin once a week. 2 mL 0   Vitamin D, Ergocalciferol, (DRISDOL) 1.25 MG (50000 UNIT) CAPS capsule Take 1 capsule (50,000 Units total) by mouth every 7 (seven) days. 4 capsule 0   No current facility-administered medications for this visit.    PAST MEDICAL HISTORY: Past Medical History:  Diagnosis Date   Anemia    Back pain    Exertional asthma    Female infertility    GERD (gastroesophageal reflux disease)    High cholesterol    Right leg pain    Vitamin D deficiency     PAST SURGICAL HISTORY: Past Surgical History:  Procedure Laterality Date   CESAREAN SECTION     LAPAROSCOPY      FAMILY HISTORY: Family History  Problem Relation Age of Onset   Diabetes Father    Lung cancer Father    Cancer Father    High Cholesterol Mother     SOCIAL HISTORY: Social History   Socioeconomic History   Marital status: Married    Spouse name: Alden Server   Number of children: 3   Years of education: 16   Highest education level: Not on file  Occupational History   Occupation: Homemaker  Tobacco Use   Smoking status: Never   Smokeless tobacco: Never  Substance and Sexual Activity   Alcohol use: Yes    Comment: rare, social   Drug use: No   Sexual activity: Not on file  Other Topics Concern   Not on file  Social History Narrative   Patient is married Alden Server) and lives at home with her family.   Patient has three children.   Patient is a homemaker.   Patient does drink caffeine maybe two cups per week.   Patient is  right-handed.   Patient has a BS degree.            Social Determinants of Health   Financial Resource Strain: Not on file  Food Insecurity: Not on file  Transportation Needs: Not on file  Physical Activity: Not on file  Stress: Not on file  Social Connections: Not on file  Intimate Partner Violence: Not on file      Levert Feinstein, M.D. Ph.D.  [facility] [address], [address] [place], [place] [address] Ph: [phone] Fax: [phone]  CC:  Daisy Floro, MD [address] [place],  [place] [address]  Daisy Floro, MD

## 2022-06-13 ENCOUNTER — Ambulatory Visit: Payer: BC Managed Care – PPO | Admitting: Neurology

## 2022-06-14 ENCOUNTER — Other Ambulatory Visit (INDEPENDENT_AMBULATORY_CARE_PROVIDER_SITE_OTHER): Payer: Self-pay | Admitting: Family Medicine

## 2022-06-14 DIAGNOSIS — E559 Vitamin D deficiency, unspecified: Secondary | ICD-10-CM

## 2022-06-29 ENCOUNTER — Ambulatory Visit (INDEPENDENT_AMBULATORY_CARE_PROVIDER_SITE_OTHER): Payer: BC Managed Care – PPO | Admitting: Family Medicine

## 2022-06-29 ENCOUNTER — Encounter (INDEPENDENT_AMBULATORY_CARE_PROVIDER_SITE_OTHER): Payer: Self-pay | Admitting: Family Medicine

## 2022-06-29 VITALS — BP 86/61 | HR 62 | Temp 98.1°F | Ht 69.0 in | Wt 203.0 lb

## 2022-06-29 DIAGNOSIS — E559 Vitamin D deficiency, unspecified: Secondary | ICD-10-CM | POA: Diagnosis not present

## 2022-06-29 DIAGNOSIS — Z6829 Body mass index (BMI) 29.0-29.9, adult: Secondary | ICD-10-CM

## 2022-06-29 DIAGNOSIS — E7849 Other hyperlipidemia: Secondary | ICD-10-CM | POA: Diagnosis not present

## 2022-06-29 DIAGNOSIS — R7303 Prediabetes: Secondary | ICD-10-CM

## 2022-06-29 MED ORDER — ZEPBOUND 10 MG/0.5ML ~~LOC~~ SOAJ
10.0000 mg | SUBCUTANEOUS | 0 refills | Status: DC
Start: 2022-06-29 — End: 2022-07-10

## 2022-06-29 MED ORDER — VITAMIN D (ERGOCALCIFEROL) 1.25 MG (50000 UNIT) PO CAPS
50000.0000 [IU] | ORAL_CAPSULE | ORAL | 0 refills | Status: DC
Start: 2022-06-29 — End: 2022-08-07

## 2022-06-29 MED ORDER — ZEPBOUND 10 MG/0.5ML ~~LOC~~ SOAJ
10.0000 mg | SUBCUTANEOUS | 0 refills | Status: DC
Start: 2022-06-29 — End: 2022-06-29

## 2022-06-29 NOTE — Progress Notes (Signed)
Sharon [name], D.O.  ABFM, ABOM Specializing in Clinical Bariatric Medicine  Office located at: [address] [place], [place]  [address]     Assessment and Plan:   Orders Placed This Encounter  Procedures   VITAMIN D 25 Hydroxy (Vit-D Deficiency, Fractures)   Lipid panel   TSH   T4, free   Hemoglobin A1c   Insulin, random   Comprehensive metabolic panel    Medications Discontinued During This Encounter  Medication Reason   Vitamin D, Ergocalciferol, (DRISDOL) 1.25 MG (50000 UNIT) CAPS capsule Reorder   tirzepatide ([name]) 10 MG/0.5ML Pen Reorder   tirzepatide ([name]) 10 MG/0.5ML Pen Reorder     Meds ordered this encounter  Medications   Vitamin D, Ergocalciferol, (DRISDOL) 1.25 MG (50000 UNIT) CAPS capsule    Sig: Take 1 capsule (50,000 Units total) by mouth every 7 (seven) days.    Dispense:  4 capsule    Refill:  0    Ov for RF   DISCONTD: tirzepatide ([name]) 10 MG/0.5ML Pen    Sig: Inject 10 mg into the skin once a week.    Dispense:  2 mL    Refill:  0   tirzepatide ([name]) 10 MG/0.5ML Pen    Sig: Inject 10 mg into the skin once a week.    Dispense:  2 mL    Refill:  0     Other hyperlipidemia Assessment: Condition is not optimized. Lab Results  Component Value Date   CHOL 230 (H) 07/05/2021   HDL 75 07/05/2021   LDLCALC 135 (H) 07/05/2021   TRIG 115 07/05/2021   CHOLHDL 3.1 07/05/2021  - Patient is currently not taking any cholesterol medication for this condition. This condition is being managed by diet.   Plan:  - Recheck labs.  - Continue our treatment plan of a heart-heathy, low cholesterol meal plan - Continue to monitor condition alongside PCP.   Prediabetes Assessment: Condition is improving, but not optimized.  Lab Results  Component Value Date   HGBA1C 5.4 02/01/2022   HGBA1C 5.8 (H) 07/05/2021   HGBA1C 5.7 (H) 01/10/2021   INSULIN 18.2 02/01/2022   INSULIN 12.6 07/05/2021   INSULIN 8.1 01/10/2021  -  She  has been compliant with [name] 10 mg once weekly. Endorses feeling itchy at the site that she injects the [name]. Denies rash or redness Overall, no concerns.  -  Her hunger and cravings have been mostly controlled. Sometimes, she has cravings when the [name] wears off.   Plan: - Recheck labs.  - Continue with med. Will refill [name] today. Pt denies need for dose increase.  - Sharon [name] will continue to work on weight loss, exercise, via their meal plan we devised to help decrease the risk of progressing to diabetes.  - Will continue to monitor condition closely.    Vitamin D deficiency Assessment: Condition is not quite at goal.  Lab Results  Component Value Date   VD25OH 42.4 02/01/2022   VD25OH 36.5 07/05/2021   VD25OH 54.2 01/10/2021  - She has been compliant and tolerant with Ergocalciferol 50K IU weekly. Denies any adverse effects.   Plan: - Recheck labs.  - Continue with supplement. Will reorder this today.   - Will continue to monitor levels regularly (every 3-4 mo on average) to keep levels within normal limits and prevent over supplementation.   TREATMENT PLAN FOR OBESITY: Class 2 severe obesity with serious comorbidity and body mass index (BMI) of 36.0 to 36.9 in adult,  unspecified obesity type [facility]) Assessment:  Sharon [name] is here to discuss her progress with her obesity treatment plan along with follow-up of her obesity related diagnoses. See Medical Weight Management Flowsheet for complete bioelectrical impedance results.  Condition is not improving Biometric data collected today, was reviewed with patient.   Since last office visit on 05/23/22 patient's  Muscle mass has increased by 2 lb. Fat mass has decreased by 4.6 lb. Total body water has decreased by 5 lb.  Counseling done on how various foods will affect these numbers and how to maximize success  Total lbs lost to date: 25 Total weight loss percentage to date: 10.96   Plan:  - Continue the  Category 2 plan   Behavioral Intervention Additional resources provided today: Meal planning tips and Healthy Proteins for Weight Loss Evidence-based interventions for health behavior change were utilized today including the discussion of self monitoring techniques, problem-solving barriers and SMART goal setting techniques.   Regarding patient's less desirable eating habits and patterns, we employed the technique of small changes.  Pt will specifically work on: continue following meal plan closely for next visit.    Recommended Physical Activity Goals  Sharon [name] has been advised to slowly work up to 150 minutes of moderate intensity aerobic activity a week and strengthening exercises 2-3 times per week for cardiovascular health, weight loss maintenance and preservation of muscle mass.   She has agreed to Continue current level of physical activity   FOLLOW UP: Return in 4-5 wks. She was informed of the importance of frequent follow up visits to maximize her success with intensive lifestyle modifications for her multiple health conditions.  [name] is aware that we will review all of her lab results at our next visit.  She is aware that if anything is critical/ life threatening with the results, we will be contacting her via MyChart prior to the office visit to discuss management.   Subjective:   Chief complaint: Obesity Lender is here to discuss her progress with her obesity treatment plan. She is on the the Category 2 Plan and states she is following her eating plan approximately 30% of the time. She states she is playing tennis 45 minutes 2 days per week.  Interval History:  Sharon [name] is here for a follow up office visit.     Since last office visit:   - Her family has been doing well.  - Her hunger and cravings have been mostly controlled. - She notes having some cravings for sweets when the effect of [name] starts to wear off.  - She has been really working on  increasing her protein intake.   Pharmacotherapy for weight loss: She is currently taking  [name]  for medical weight loss.  Denies side effects.    Review of Systems:  Pertinent positives were addressed with patient today.  Weight Summary and Biometrics   Weight Lost Since Last Visit: 2  Weight Gained Since Last Visit: 0    Vitals Temp: 98.1 F (36.7 C) BP: (!) 86/61 Pulse Rate: 62 SpO2: 97 %   Anthropometric Measurements Height: 5\' 9"  (1.753 m) Weight: 203 lb (92.1 kg) BMI (Calculated): 29.96 Weight at Last Visit: 205lb Weight Lost Since Last Visit: 2 Weight Gained Since Last Visit: 0 Starting Weight: 228lb Total Weight Loss (lbs): 25 lb (11.3 kg)   Body Composition  Body Fat %: 36.9 % Fat Mass (lbs): 75 lbs Muscle Mass (lbs): 121.6 lbs Total Body Water (lbs): 73.4 lbs  Visceral Fat Rating : 9   Other Clinical Data Fasting: yes Labs: yes Today's Visit #: 35 Starting Date: 10/30/19    Objective:   PHYSICAL EXAM: Blood pressure (!) 86/61, pulse 62, temperature 98.1 F (36.7 C), height 5\' 9"  (1.753 m), weight 203 lb (92.1 kg), last menstrual period 03/27/2013, SpO2 97 %. Body mass index is 29.98 kg/m.  General: Well Developed, well nourished, and in no acute distress.  HEENT: Normocephalic, atraumatic Skin: Warm and dry, cap RF less 2 sec, good turgor Chest:  Normal excursion, shape, no gross abn Respiratory: speaking in full sentences, no conversational dyspnea NeuroM-Sk: Ambulates w/o assistance, moves * 4 Psych: A and O *3, insight good, mood-full  DIAGNOSTIC DATA REVIEWED:  BMET    Component Value Date/Time   NA 144 07/05/2021 1103   K 5.1 07/05/2021 1103   CL 103 07/05/2021 1103   CO2 27 07/05/2021 1103   GLUCOSE 99 07/05/2021 1103   GLUCOSE 106 (H) 05/13/2019 0840   BUN 21 07/05/2021 1103   CREATININE 0.86 07/05/2021 1103   CALCIUM 9.8 07/05/2021 1103   GFRNONAA 75 02/16/2020 1252   GFRAA 86 02/16/2020 1252   Lab Results   Component Value Date   HGBA1C 5.4 02/01/2022   HGBA1C 6.1 (H) 10/30/2019   Lab Results  Component Value Date   INSULIN 18.2 02/01/2022   INSULIN 20.2 10/30/2019   Lab Results  Component Value Date   TSH 1.570 01/10/2021   CBC    Component Value Date/Time   WBC 5.4 01/10/2021 0917   WBC 5.5 05/13/2019 0840   RBC 4.69 01/10/2021 0917   RBC 4.72 05/13/2019 0840   HGB 13.4 01/10/2021 0917   HCT 39.9 01/10/2021 0917   PLT 245 01/10/2021 0917   MCV 85 01/10/2021 0917   MCH 28.6 01/10/2021 0917   MCH 28.4 05/13/2019 0840   MCHC 33.6 01/10/2021 0917   MCHC 31.8 05/13/2019 0840   RDW 13.4 01/10/2021 0917   Iron Studies No results found for: "IRON", "TIBC", "FERRITIN", "IRONPCTSAT" Lipid Panel     Component Value Date/Time   CHOL 230 (H) 07/05/2021 1103   TRIG 115 07/05/2021 1103   HDL 75 07/05/2021 1103   CHOLHDL 3.1 07/05/2021 1103   LDLCALC 135 (H) 07/05/2021 1103   Hepatic Function Panel     Component Value Date/Time   PROT 7.0 01/04/2022 0930   ALBUMIN 4.5 07/05/2021 1103   AST 17 07/05/2021 1103   ALT 17 07/05/2021 1103   ALKPHOS 68 07/05/2021 1103   BILITOT 0.3 07/05/2021 1103      Component Value Date/Time   TSH 1.570 01/10/2021 0917   Nutritional Lab Results  Component Value Date   VD25OH 42.4 02/01/2022   VD25OH 36.5 07/05/2021   VD25OH 54.2 01/10/2021    Attestations:   Reviewed by clinician on day of visit: allergies, medications, problem list, medical history, surgical history, family history, social history, and previous encounter notes.   I,Special Puri,acting as a Neurosurgeon for [employer], DO.,have documented all relevant documentation on the behalf of Thomasene Lot, DO,as directed by  Thomasene Lot, DO while in the presence of Thomasene Lot, DO.   I, Thomasene Lot, DO, have reviewed all documentation for this visit. The documentation on 06/29/22 for the exam, diagnosis, procedures, and orders are all accurate and complete.

## 2022-06-30 LAB — COMPREHENSIVE METABOLIC PANEL
ALT: 12 IU/L (ref 0–32)
AST: 15 IU/L (ref 0–40)
Albumin/Globulin Ratio: 1.4 (ref 1.2–2.2)
Albumin: 4.1 g/dL (ref 3.8–4.9)
Alkaline Phosphatase: 71 IU/L (ref 44–121)
BUN/Creatinine Ratio: 23 (ref 9–23)
BUN: 19 mg/dL (ref 6–24)
Bilirubin Total: 0.2 mg/dL (ref 0.0–1.2)
CO2: 24 mmol/L (ref 20–29)
Calcium: 9.3 mg/dL (ref 8.7–10.2)
Chloride: 102 mmol/L (ref 96–106)
Creatinine, Ser: 0.82 mg/dL (ref 0.57–1.00)
Globulin, Total: 2.9 g/dL (ref 1.5–4.5)
Glucose: 85 mg/dL (ref 70–99)
Potassium: 4.7 mmol/L (ref 3.5–5.2)
Sodium: 142 mmol/L (ref 134–144)
Total Protein: 7 g/dL (ref 6.0–8.5)
eGFR: 84 mL/min/{1.73_m2} (ref >59)

## 2022-06-30 LAB — HEMOGLOBIN A1C
Est. average glucose Bld gHb Est-mCnc: 117 mg/dL
Hgb A1c MFr Bld: 5.7 % — ABNORMAL HIGH (ref 4.8–5.6)

## 2022-06-30 LAB — LIPID PANEL
Chol/HDL Ratio: 3.1 ratio (ref 0.0–4.4)
Cholesterol, Total: 198 mg/dL (ref 100–199)
HDL: 63 mg/dL (ref >39)
LDL Chol Calc (NIH): 119 mg/dL — ABNORMAL HIGH (ref 0–99)
Triglycerides: 90 mg/dL (ref 0–149)
VLDL Cholesterol Cal: 16 mg/dL (ref 5–40)

## 2022-06-30 LAB — T4, FREE: Free T4: 1.34 ng/dL (ref 0.82–1.77)

## 2022-06-30 LAB — INSULIN, RANDOM: INSULIN: 25.1 u[IU]/mL — ABNORMAL HIGH (ref 2.6–24.9)

## 2022-06-30 LAB — VITAMIN D 25 HYDROXY (VIT D DEFICIENCY, FRACTURES): Vit D, 25-Hydroxy: 63.8 ng/mL (ref 30.0–100.0)

## 2022-06-30 LAB — TSH: TSH: 1.46 u[IU]/mL (ref 0.450–4.500)

## 2022-07-05 ENCOUNTER — Encounter (INDEPENDENT_AMBULATORY_CARE_PROVIDER_SITE_OTHER): Payer: Self-pay | Admitting: Family Medicine

## 2022-07-05 DIAGNOSIS — R7303 Prediabetes: Secondary | ICD-10-CM

## 2022-07-10 ENCOUNTER — Other Ambulatory Visit (INDEPENDENT_AMBULATORY_CARE_PROVIDER_SITE_OTHER): Payer: Self-pay | Admitting: Family Medicine

## 2022-07-10 MED ORDER — ZEPBOUND 12.5 MG/0.5ML ~~LOC~~ SOAJ
12.5000 mg | SUBCUTANEOUS | 0 refills | Status: DC
Start: 2022-07-10 — End: 2022-08-07

## 2022-07-10 MED ORDER — ZEPBOUND 12.5 MG/0.5ML ~~LOC~~ SOAJ: 12.5000 mg | SUBCUTANEOUS | 0 refills | Status: DC

## 2022-07-10 NOTE — Telephone Encounter (Signed)
Call to [facility] to notify them to cancel the 12.5 mg dose as it has been sent to [facility].

## 2022-07-18 DIAGNOSIS — K649 Unspecified hemorrhoids: Secondary | ICD-10-CM | POA: Diagnosis not present

## 2022-07-18 DIAGNOSIS — Z83719 Family history of colon polyps, unspecified: Secondary | ICD-10-CM | POA: Diagnosis not present

## 2022-07-18 DIAGNOSIS — D123 Benign neoplasm of transverse colon: Secondary | ICD-10-CM | POA: Diagnosis not present

## 2022-07-18 DIAGNOSIS — K5730 Diverticulosis of large intestine without perforation or abscess without bleeding: Secondary | ICD-10-CM | POA: Diagnosis not present

## 2022-07-18 DIAGNOSIS — Z1211 Encounter for screening for malignant neoplasm of colon: Secondary | ICD-10-CM | POA: Diagnosis not present

## 2022-07-20 ENCOUNTER — Ambulatory Visit (INDEPENDENT_AMBULATORY_CARE_PROVIDER_SITE_OTHER): Payer: BC Managed Care – PPO | Admitting: Family Medicine

## 2022-07-31 ENCOUNTER — Other Ambulatory Visit (INDEPENDENT_AMBULATORY_CARE_PROVIDER_SITE_OTHER): Payer: Self-pay | Admitting: Family Medicine

## 2022-07-31 DIAGNOSIS — E559 Vitamin D deficiency, unspecified: Secondary | ICD-10-CM

## 2022-08-03 ENCOUNTER — Ambulatory Visit (INDEPENDENT_AMBULATORY_CARE_PROVIDER_SITE_OTHER): Payer: BC Managed Care – PPO | Admitting: Family Medicine

## 2022-08-07 ENCOUNTER — Ambulatory Visit (INDEPENDENT_AMBULATORY_CARE_PROVIDER_SITE_OTHER): Payer: BC Managed Care – PPO | Admitting: Family Medicine

## 2022-08-07 ENCOUNTER — Encounter (INDEPENDENT_AMBULATORY_CARE_PROVIDER_SITE_OTHER): Payer: Self-pay | Admitting: Family Medicine

## 2022-08-07 VITALS — BP 106/73 | HR 64 | Temp 98.3°F | Ht 69.0 in | Wt 198.0 lb

## 2022-08-07 DIAGNOSIS — E559 Vitamin D deficiency, unspecified: Secondary | ICD-10-CM

## 2022-08-07 DIAGNOSIS — E7849 Other hyperlipidemia: Secondary | ICD-10-CM | POA: Diagnosis not present

## 2022-08-07 DIAGNOSIS — Z6829 Body mass index (BMI) 29.0-29.9, adult: Secondary | ICD-10-CM

## 2022-08-07 DIAGNOSIS — Z6836 Body mass index (BMI) 36.0-36.9, adult: Secondary | ICD-10-CM

## 2022-08-07 DIAGNOSIS — R7303 Prediabetes: Secondary | ICD-10-CM

## 2022-08-07 MED ORDER — VITAMIN D (ERGOCALCIFEROL) 1.25 MG (50000 UNIT) PO CAPS
50000.0000 [IU] | ORAL_CAPSULE | ORAL | 0 refills | Status: DC
Start: 2022-08-07 — End: 2022-08-31

## 2022-08-07 MED ORDER — ZEPBOUND 12.5 MG/0.5ML ~~LOC~~ SOAJ: 12.5000 mg | SUBCUTANEOUS | 0 refills | Status: DC

## 2022-08-07 NOTE — Progress Notes (Signed)
Sharon [name], D.O.  ABFM, ABOM Specializing in Clinical Bariatric Medicine  Office located at: [address] [place], [place]  [address]     Assessment and Plan:   Medications Discontinued During This Encounter  Medication Reason   Vitamin D, Ergocalciferol, (DRISDOL) 1.25 MG (50000 UNIT) CAPS capsule Reorder   tirzepatide ([name]) 12.5 MG/0.5ML Pen Reorder    Meds ordered this encounter  Medications   Vitamin D, Ergocalciferol, (DRISDOL) 1.25 MG (50000 UNIT) CAPS capsule    Sig: Take 1 capsule (50,000 Units total) by mouth every 7 (seven) days.    Dispense:  4 capsule    Refill:  0    Ov for RF   tirzepatide ([name]) 12.5 MG/0.5ML Pen    Sig: Inject 12.5 mg into the skin once a week.    Dispense:  2 mL    Refill:  0     Vitamin D deficiency Assessment: Condition is Controlled. Her vitamin has improved from 42.2 to 63.8. She states that she is inconsistently taking her Ergocalciferol 50K IU weekly but is getting vitamin D from being outside a lot. She denies any side effects.  Lab Results  Component Value Date   VD25OH 63.8 06/29/2022   VD25OH 42.4 02/01/2022   VD25OH 36.5 07/05/2021   Plan: Continue to take ERGO more consisently. Will refilll today. Weight loss will likely improve availability of vitamin D, thus encouraged Sharon [name] to continue with meal plan and their weight loss efforts to further improve this condition.  Thus, we will need to monitor levels regularly (every 3-4 mo on average) to keep levels within normal limits and prevent over supplementation. All recent blood work that we ordered was reviewed with patient today.  Patient was counseled on all abnormalities and we discussed dietary and lifestyle changes that could help those values (also medications when appropriate).  Extensive health counseling performed and all patient's concerns/ questions were addressed.    Prediabetes Assessment: Condition is Not optimized..  She continues [name] 10mg   once weekly. Pt feels as [name] now helps curb her hunger and cravings longer than befpre. Lab Results  Component Value Date   HGBA1C 5.7 (H) 06/29/2022   HGBA1C 5.4 02/01/2022   HGBA1C 5.8 (H) 07/05/2021   INSULIN 25.1 (H) 06/29/2022   INSULIN 18.2 02/01/2022   INSULIN 12.6 07/05/2021   Lab Results  Component Value Date   TSH 1.460 06/29/2022   T3TOTAL 114 10/30/2019   Lab Results  Component Value Date   CREATININE 0.82 06/29/2022   BUN 19 06/29/2022   NA 142 06/29/2022   K 4.7 06/29/2022   CL 102 06/29/2022   CO2 24 06/29/2022      Component Value Date/Time   PROT 7.0 06/29/2022 0824   ALBUMIN 4.1 06/29/2022 0824   AST 15 06/29/2022 0824   ALT 12 06/29/2022 0824   ALKPHOS 71 06/29/2022 0824   BILITOT 0.2 06/29/2022 0824    Plan: Continue with  [name] as instructed. Will refilll today.  In addition, we discussed the risks and benefits of various medication options such as Metformin which can help [place] in the management of this disease process as well as with weight loss.  Will consider starting one of these meds in future as we will focus on prudent nutritional plan at this time.   Reiterated to continue to decrease simple carbs/ sugars; increase fiber and proteins -> follow her meal plan. Anticipatory guidance given. Sharon [name] will continue to work on weight loss, exercise, via  their meal plan we devised to help decrease the risk of progressing to diabetes. We will recheck A1c and fasting insulin level in approximately 3 months from last check, or as deemed appropriate. Labs were reviewed with patient today and education provided on them. We discussed how the foods patient eats may influence these laboratory findings.  All of the patient's questions about them were answered    Other hyperlipidemia Assessment: Condition is Improving, but not optimized. Pt had a  coronary calcium score on 02/28/2022 that resulted to be 0. Her LDL is elevated 119. This is diet/exercise controlled.   Lab Results  Component Value Date   CHOL 198 06/29/2022   HDL 63 06/29/2022   LDLCALC 119 (H) 06/29/2022   TRIG 90 06/29/2022   CHOLHDL 3.1 06/29/2022   Plan: I stressed the importance that patient continue with our prudent nutritional plan that is low in saturated and trans fats, and low in fatty carbs to improve these numbers. We will continue routine screening as patient continues to achieve health goals along their weight loss journey. All recent blood work that we ordered was reviewed with patient today.  Patient was counseled on all abnormalities and we discussed dietary and lifestyle changes that could help those values (also medications when appropriate).  Extensive health counseling performed and all patient's concerns/ questions were addressed.     TREATMENT PLAN FOR OBESITY: Class 2 severe obesity with serious comorbidity and body mass index (BMI) of 36.0 to 36.9 in adult, unspecified obesity type [facility]) Assessment:  Sharon [name] is here to discuss her progress with her obesity treatment plan along with follow-up of her obesity related diagnoses. See Medical Weight Management Flowsheet for complete bioelectrical impedance results.  Condition is not optimized. Biometric data collected today, was reviewed with patient.   Since last office visit on 08/07/2022 patient's  Muscle mass has decreased by 3lb. Fat mass has decreased by 3lb. Total body water has increased by 2.6lb.  Counseling done on how various foods will affect these numbers and how to maximize success  Total lbs lost to date: 30lbs Total weight loss percentage to date: 13.16  Plan: Continue to adhere to the Category 2 plan.  - Unless pre-existing renal or cardiopulmonary conditions exist which patient was told to limit their fluid intake by another provider, I recommended roughly one half of their weight in pounds, to be the approximate ounces of non-caloric, non-caffeinated beverages they should drink per day;  including more if they are engaging in exercise.  - If/when she wants too, I instructed her to drink only 1 protein shake a day.   Behavioral Intervention Additional resources provided today: patient declined Evidence-based interventions for health behavior change were utilized today including the discussion of self monitoring techniques, problem-solving barriers and SMART goal setting techniques.   Regarding patient's less desirable eating habits and patterns, we employed the technique of small changes.  Pt will specifically work on: Continue adhering to prudent meal plan and increase water intake for next visit.    Recommended Physical Activity Goals Sharon [name] has been advised to slowly work up to 150 minutes of moderate intensity aerobic activity a week and strengthening exercises 2-3 times per week for cardiovascular health, weight loss maintenance and preservation of muscle mass.   She has agreed to Continue current level of physical activity  and Think about ways to increase daily physical activity and overcoming barriers to exercise  FOLLOW UP: Return in about 4 weeks (around 09/04/2022). She was informed  of the importance of frequent follow up visits to maximize her success with intensive lifestyle modifications for her multiple health conditions.  Subjective:   Chief complaint: Obesity Sharon [name] is here to discuss her progress with her obesity treatment plan. She is on the the Category 2 Plan and states she is following her eating plan approximately 50% of the time. She states she is going to tennis and using weights 60 minutes 4 days per week.  Interval History:  Sharon [name] is here for a follow up office visit. Since last OV she has been doing well and will be going on a trip this Wednesday to [place]. Her hunger and cravings are mainly controlled since Zepbounds increase in dose. She notes that she has been a little dehydrated as she tries to drink more water but sweats a lot.    Pharmacotherapy for weight loss: She is currently taking [name] for medical weight loss.  Denies side effects.    Review of Systems:  Pertinent positives were addressed with patient today.  Reviewed by clinician on day of visit: allergies, medications, problem list, medical history, surgical history, family history, social history, and previous encounter notes.  Weight Summary and Biometrics   Weight Lost Since Last Visit: 5lb  Weight Gained Since Last Visit: 0lb  Vitals Temp: 98.3 F (36.8 C) BP: 106/73 Pulse Rate: 64 SpO2: 98 %   Anthropometric Measurements Height: 5\' 9"  (1.753 m) Weight: 198 lb (89.8 kg) BMI (Calculated): 29.23 Weight at Last Visit: 203lb Weight Lost Since Last Visit: 5lb Weight Gained Since Last Visit: 0lb Starting Weight: 228lb Total Weight Loss (lbs): 30 lb (13.6 kg)   Body Composition  Body Fat %: 36.9 % Fat Mass (lbs): 73 lbs Muscle Mass (lbs): 118.6 lbs Total Body Water (lbs): 76 lbs Visceral Fat Rating : 9   Other Clinical Data Fasting: no Labs: no Today's Visit #: 36 Starting Date: 10/30/19   Objective:   PHYSICAL EXAM: Blood pressure 106/73, pulse 64, temperature 98.3 F (36.8 C), height 5\' 9"  (1.753 m), weight 198 lb (89.8 kg), last menstrual period 03/27/2013, SpO2 98%. Body mass index is 29.24 kg/m.  General: Well Developed, well nourished, and in no acute distress.  HEENT: Normocephalic, atraumatic Skin: Warm and dry, cap RF less 2 sec, good turgor Chest:  Normal excursion, shape, no gross abn Respiratory: speaking in full sentences, no conversational dyspnea NeuroM-Sk: Ambulates w/o assistance, moves * 4 Psych: A and O *3, insight good, mood-full  DIAGNOSTIC DATA REVIEWED:  BMET    Component Value Date/Time   NA 142 06/29/2022 0824   K 4.7 06/29/2022 0824   CL 102 06/29/2022 0824   CO2 24 06/29/2022 0824   GLUCOSE 85 06/29/2022 0824   GLUCOSE 106 (H) 05/13/2019 0840   BUN 19 06/29/2022 0824   CREATININE  0.82 06/29/2022 0824   CALCIUM 9.3 06/29/2022 0824   GFRNONAA 75 02/16/2020 1252   GFRAA 86 02/16/2020 1252   Lab Results  Component Value Date   HGBA1C 5.7 (H) 06/29/2022   HGBA1C 6.1 (H) 10/30/2019   Lab Results  Component Value Date   INSULIN 25.1 (H) 06/29/2022   INSULIN 20.2 10/30/2019   Lab Results  Component Value Date   TSH 1.460 06/29/2022   CBC    Component Value Date/Time   WBC 5.4 01/10/2021 0917   WBC 5.5 05/13/2019 0840   RBC 4.69 01/10/2021 0917   RBC 4.72 05/13/2019 0840   HGB 13.4 01/10/2021 0917   HCT 39.9  01/10/2021 0917   PLT 245 01/10/2021 0917   MCV 85 01/10/2021 0917   MCH 28.6 01/10/2021 0917   MCH 28.4 05/13/2019 0840   MCHC 33.6 01/10/2021 0917   MCHC 31.8 05/13/2019 0840   RDW 13.4 01/10/2021 0917   Iron Studies No results found for: "IRON", "TIBC", "FERRITIN", "IRONPCTSAT" Lipid Panel     Component Value Date/Time   CHOL 198 06/29/2022 0824   TRIG 90 06/29/2022 0824   HDL 63 06/29/2022 0824   CHOLHDL 3.1 06/29/2022 0824   LDLCALC 119 (H) 06/29/2022 0824   Hepatic Function Panel     Component Value Date/Time   PROT 7.0 06/29/2022 0824   ALBUMIN 4.1 06/29/2022 0824   AST 15 06/29/2022 0824   ALT 12 06/29/2022 0824   ALKPHOS 71 06/29/2022 0824   BILITOT 0.2 06/29/2022 0824      Component Value Date/Time   TSH 1.460 06/29/2022 0824   Nutritional Lab Results  Component Value Date   VD25OH 63.8 06/29/2022   VD25OH 42.4 02/01/2022   VD25OH 36.5 07/05/2021    Attestations:   This encounter took 40 total minutes of time including any pre-visit and post-visit time spent on this date of service, including taking a thorough history, reviewing any labs and/or imaging, reviewing prior notes, as well as documenting in the electronic health record on the date of service. Over 50% of that time was in direct face-to-face counseling and coordinating care for the patient today  I, Clinical biochemist, acting as a Stage manager for [employer], DO., have compiled all relevant documentation for today's office visit on behalf of Thomasene Lot, DO, while in the presence of [employer], DO.  I have reviewed the above documentation for accuracy and completeness, and I agree with the above. Sharon [name], D.O.  The 21st Century Cures Act was signed into law in 2016 which includes the topic of electronic health records.  This provides immediate access to information in MyChart.  This includes consultation notes, operative notes, office notes, lab results and pathology reports.  If you have any questions about what you read please let us know at your next visit so we can discuss your concerns and take corrective action if need be.  We are right here with you.

## 2022-08-20 ENCOUNTER — Other Ambulatory Visit (INDEPENDENT_AMBULATORY_CARE_PROVIDER_SITE_OTHER): Payer: Self-pay | Admitting: Family Medicine

## 2022-08-20 DIAGNOSIS — E559 Vitamin D deficiency, unspecified: Secondary | ICD-10-CM

## 2022-08-31 ENCOUNTER — Ambulatory Visit (INDEPENDENT_AMBULATORY_CARE_PROVIDER_SITE_OTHER): Payer: BC Managed Care – PPO | Admitting: Family Medicine

## 2022-08-31 ENCOUNTER — Encounter (INDEPENDENT_AMBULATORY_CARE_PROVIDER_SITE_OTHER): Payer: Self-pay | Admitting: Family Medicine

## 2022-08-31 VITALS — BP 103/71 | HR 57 | Temp 97.9°F | Ht 69.0 in | Wt 196.0 lb

## 2022-08-31 DIAGNOSIS — E559 Vitamin D deficiency, unspecified: Secondary | ICD-10-CM

## 2022-08-31 DIAGNOSIS — R7303 Prediabetes: Secondary | ICD-10-CM

## 2022-08-31 DIAGNOSIS — Z6828 Body mass index (BMI) 28.0-28.9, adult: Secondary | ICD-10-CM | POA: Diagnosis not present

## 2022-08-31 DIAGNOSIS — K219 Gastro-esophageal reflux disease without esophagitis: Secondary | ICD-10-CM

## 2022-08-31 DIAGNOSIS — E7849 Other hyperlipidemia: Secondary | ICD-10-CM

## 2022-08-31 MED ORDER — ZEPBOUND 12.5 MG/0.5ML ~~LOC~~ SOAJ: 12.5000 mg | SUBCUTANEOUS | 1 refills | Status: DC

## 2022-08-31 MED ORDER — VITAMIN D (ERGOCALCIFEROL) 1.25 MG (50000 UNIT) PO CAPS
50000.0000 [IU] | ORAL_CAPSULE | ORAL | 0 refills | Status: DC
Start: 2022-08-31 — End: 2022-11-01

## 2022-08-31 NOTE — Progress Notes (Signed)
Sharon [name], D.O.  ABFM, ABOM Specializing in Clinical Bariatric Medicine  Office located at: [address] [place], [place]  [address]     Assessment and Plan:   Medications Discontinued During This Encounter  Medication Reason   Vitamin D, Ergocalciferol, (DRISDOL) 1.25 MG (50000 UNIT) CAPS capsule Reorder   tirzepatide ([name]) 12.5 MG/0.5ML Pen Reorder    Meds ordered this encounter  Medications   Vitamin D, Ergocalciferol, (DRISDOL) 1.25 MG (50000 UNIT) CAPS capsule    Sig: Take 1 capsule (50,000 Units total) by mouth every 7 (seven) days.    Dispense:  12 capsule    Refill:  0    Ov for RF   tirzepatide ([name]) 12.5 MG/0.5ML Pen    Sig: Inject 12.5 mg into the skin once a week.    Dispense:  2 mL    Refill:  1    Vitamin D deficiency Assessment: Condition is Controlled. This is well controlled with Ergocalciferol and she tolerates this well with no adverse side effects.  Lab Results  Component Value Date   VD25OH 63.8 06/29/2022   VD25OH 42.4 02/01/2022   VD25OH 36.5 07/05/2021   Plan: - Continue Ergocalciferol 50K IU weekly. I will refill today.   -  she was encouraged to continue to take the medicine until told otherwise.     - weight loss will likely improve availability of vitamin D, thus encouraged Sharon [name] to continue with meal plan and their weight loss efforts to further improve this condition.  Thus, we will need to monitor levels regularly (every 3-4 mo on average) to keep levels within normal limits and prevent over supplementation.   Prediabetes Assessment: Condition is Not at goal. Pt A1c and insulin levels are elevated. Pt has been complaint and tolerant of [name] and denies and N/V/D. She denies hunger and cravings.  Lab Results  Component Value Date   HGBA1C 5.7 (H) 06/29/2022   HGBA1C 5.4 02/01/2022   HGBA1C 5.8 (H) 07/05/2021   INSULIN 25.1 (H) 06/29/2022   INSULIN 18.2 02/01/2022   INSULIN 12.6 07/05/2021    Plan: -  Continue with [name] 12.5mg  once weekly. She denies need for increase in dosage.  I will refill today.   - Continue her prudent nutritional plan that is low in simple carbohydrates, saturated fats and trans fats to goal of 5-10% weight loss to achieve significant health benefits.  Pt encouraged to continually advance exercise and cardiovascular fitness as tolerated throughout weight loss journey.  - Anticipatory guidance given.    - We will recheck A1c and fasting insulin level in approximately 3 months from last check, or as deemed appropriate.     TREATMENT PLAN FOR OBESITY: Class 2 severe obesity with serious comorbidity and body mass index (BMI) of 36.0 to 36.9 in adult, unspecified obesity type [facility]), current BMI 28.93 Assessment:  Sharon [name] is here to discuss her progress with her obesity treatment plan along with follow-up of her obesity related diagnoses. See Medical Weight Management Flowsheet for complete bioelectrical impedance results.  Condition is docourse: improving. Biometric data collected today, was reviewed with patient.   Since last office visit on 08/07/2022 patient's  Muscle mass has increased by 0.6lb. Fat mass has decreased by 2.6lb. Total body water has decreased by 4.4lb.  Counseling done on how various foods will affect these numbers and how to maximize success  Total lbs lost to date: 32lbs Total weight loss percentage to date: 14.04%   Plan: -  Continue to follow our prudent category 2 meal plan.   - I recommended bariatricpal website to help find healthier food options.  Behavioral Intervention Additional resources provided today:  Provided hand out on proper plate portions, healthy complex carbs and sources of protein as well as generaly healthy eating concepts.  Evidence-based interventions for health behavior change were utilized today including the discussion of self monitoring techniques, problem-solving barriers and SMART goal setting  techniques.   Regarding patient's less desirable eating habits and patterns, we employed the technique of small changes.  Pt will specifically work on: Getting back on the nutritional meal plan for next visit.    Recommended Physical Activity Goals  Sharon [name] has been advised to slowly work up to 150 minutes of moderate intensity aerobic activity a week and strengthening exercises 2-3 times per week for cardiovascular health, weight loss maintenance and preservation of muscle mass.   She has agreed to Continue current level of physical activity    FOLLOW UP: No follow-ups on file.  She was informed of the importance of frequent follow up visits to maximize her success with intensive lifestyle modifications for her multiple health conditions.  Subjective:   Chief complaint: Obesity Sharon [name] is here to discuss her progress with her obesity treatment plan. She is on the the Category 2 Plan and states she is following her eating plan approximately 20% of the time. She states she is swimming and walking 40 minutes 3 days per week.  Interval History:  Sharon [name] is here for a follow up office visit. Since last OV she has been well and busy with traveling and her kids schedule. During her time in [place] she tried to stay on plan but options were limited main meals consisted of meat, lentils, and curry. She did crave American food but that is due from her being out of the country.     Pharmacotherapy for weight loss: She is currently taking [name] for medical weight loss.  Denies side effects.    Review of Systems:  Pertinent positives were addressed with patient today.   Reviewed by clinician on day of visit: allergies, medications, problem list, medical history, surgical history, family history, social history, and previous encounter notes.  Weight Summary and Biometrics   Weight Lost Since Last Visit: 2lb  Weight Gained Since Last Visit: 0lb   Vitals Temp: 97.9 F (36.6 C) BP:  103/71 Pulse Rate: (!) 57 SpO2: 94 %   Anthropometric Measurements Height: 5\' 9"  (1.753 m) Weight: 196 lb (88.9 kg) BMI (Calculated): 28.93 Weight at Last Visit: 198lb Weight Lost Since Last Visit: 2lb Weight Gained Since Last Visit: 0lb Starting Weight: 228lb Total Weight Loss (lbs): 32 lb (14.5 kg) Peak Weight: 228lb   Body Composition  Body Fat %: 35.9 % Fat Mass (lbs): 70.4 lbs Muscle Mass (lbs): 119.2 lbs Total Body Water (lbs): 71.6 lbs Visceral Fat Rating : 8   Other Clinical Data Fasting: no Labs: no Today's Visit #: 37 Starting Date: 10/30/19     Objective:   PHYSICAL EXAM: Blood pressure 103/71, pulse (!) 57, temperature 97.9 F (36.6 C), height 5\' 9"  (1.753 m), weight 196 lb (88.9 kg), last menstrual period 03/27/2013, SpO2 94%. Body mass index is 28.94 kg/m.  General: Well Developed, well nourished, and in no acute distress.  HEENT: Normocephalic, atraumatic Skin: Warm and dry, cap RF less 2 sec, good turgor Chest:  Normal excursion, shape, no gross abn Respiratory: speaking in full sentences, no conversational dyspnea NeuroM-Sk:  Ambulates w/o assistance, moves * 4 Psych: A and O *3, insight good, mood-full  DIAGNOSTIC DATA REVIEWED:  BMET    Component Value Date/Time   NA 142 06/29/2022 0824   K 4.7 06/29/2022 0824   CL 102 06/29/2022 0824   CO2 24 06/29/2022 0824   GLUCOSE 85 06/29/2022 0824   GLUCOSE 106 (H) 05/13/2019 0840   BUN 19 06/29/2022 0824   CREATININE 0.82 06/29/2022 0824   CALCIUM 9.3 06/29/2022 0824   GFRNONAA 75 02/16/2020 1252   GFRAA 86 02/16/2020 1252   Lab Results  Component Value Date   HGBA1C 5.7 (H) 06/29/2022   HGBA1C 6.1 (H) 10/30/2019   Lab Results  Component Value Date   INSULIN 25.1 (H) 06/29/2022   INSULIN 20.2 10/30/2019   Lab Results  Component Value Date   TSH 1.460 06/29/2022   CBC    Component Value Date/Time   WBC 5.4 01/10/2021 0917   WBC 5.5 05/13/2019 0840   RBC 4.69 01/10/2021 0917    RBC 4.72 05/13/2019 0840   HGB 13.4 01/10/2021 0917   HCT 39.9 01/10/2021 0917   PLT 245 01/10/2021 0917   MCV 85 01/10/2021 0917   MCH 28.6 01/10/2021 0917   MCH 28.4 05/13/2019 0840   MCHC 33.6 01/10/2021 0917   MCHC 31.8 05/13/2019 0840   RDW 13.4 01/10/2021 0917   Iron Studies No results found for: "IRON", "TIBC", "FERRITIN", "IRONPCTSAT" Lipid Panel     Component Value Date/Time   CHOL 198 06/29/2022 0824   TRIG 90 06/29/2022 0824   HDL 63 06/29/2022 0824   CHOLHDL 3.1 06/29/2022 0824   LDLCALC 119 (H) 06/29/2022 0824   Hepatic Function Panel     Component Value Date/Time   PROT 7.0 06/29/2022 0824   ALBUMIN 4.1 06/29/2022 0824   AST 15 06/29/2022 0824   ALT 12 06/29/2022 0824   ALKPHOS 71 06/29/2022 0824   BILITOT 0.2 06/29/2022 0824      Component Value Date/Time   TSH 1.460 06/29/2022 0824   Nutritional Lab Results  Component Value Date   VD25OH 63.8 06/29/2022   VD25OH 42.4 02/01/2022   VD25OH 36.5 07/05/2021    Attestations:   I, Clinical biochemist, acting as a Stage manager for [employer], DO., have compiled all relevant documentation for today's office visit on behalf of Thomasene Lot, DO, while in the presence of [employer], DO.  I have reviewed the above documentation for accuracy and completeness, and I agree with the above. Sharon [name], D.O.  The 21st Century Cures Act was signed into law in 2016 which includes the topic of electronic health records.  This provides immediate access to information in MyChart.  This includes consultation notes, operative notes, office notes, lab results and pathology reports.  If you have any questions about what you read please let us know at your next visit so we can discuss your concerns and take corrective action if need be.  We are right here with you.

## 2022-09-26 ENCOUNTER — Telehealth (INDEPENDENT_AMBULATORY_CARE_PROVIDER_SITE_OTHER): Payer: Self-pay

## 2022-09-26 NOTE — Telephone Encounter (Signed)
Prior auth for [employer] sent to [employer]. Awaiting response Key: [id number])

## 2022-10-05 ENCOUNTER — Telehealth (INDEPENDENT_AMBULATORY_CARE_PROVIDER_SITE_OTHER): Payer: Self-pay

## 2022-10-05 ENCOUNTER — Encounter (INDEPENDENT_AMBULATORY_CARE_PROVIDER_SITE_OTHER): Payer: Self-pay

## 2022-10-05 DIAGNOSIS — R202 Paresthesia of skin: Secondary | ICD-10-CM | POA: Insufficient documentation

## 2022-10-05 NOTE — Telephone Encounter (Signed)
Prior auth for [name] 12.5 mg submitted to Cover my meds. Awaiting determination.

## 2022-10-05 NOTE — Telephone Encounter (Signed)
Prior Auth started for [name] 12.5.  Waiting for questions.

## 2022-10-09 NOTE — Telephone Encounter (Signed)
Checking on status of authorization.  This is the message from the plan.   [employer] has not yet replied to your PA request. You may close this dialog, return to your dashboard, and perform other tasks.  To check for an update later, open this request again from your dashboard.  If [employer] has not replied to your request within 24 hours for an urgent request or 72 hours for a standard request, please contact [employer] [place] at [phone] option 3, then option 3 again.

## 2022-10-12 NOTE — Telephone Encounter (Signed)
[name], has been denied.  Patient has been notified.

## 2022-10-12 NOTE — Telephone Encounter (Signed)
Call to pharmacy intake to check on status of [name] approval.  Spoke with Amada Kingfisher R., case is still being reviewed, we should get a determination soon.  Additional information provided to pharmacy.    [employer] [place] at [phone] option 3, then option 3 again. Case # [id number].

## 2022-10-25 ENCOUNTER — Encounter: Payer: Self-pay | Admitting: Sports Medicine

## 2022-10-25 ENCOUNTER — Other Ambulatory Visit: Payer: Self-pay

## 2022-10-25 ENCOUNTER — Ambulatory Visit (INDEPENDENT_AMBULATORY_CARE_PROVIDER_SITE_OTHER): Payer: BC Managed Care – PPO | Admitting: Sports Medicine

## 2022-10-25 VITALS — BP 102/74 | Ht 69.0 in | Wt 195.0 lb

## 2022-10-25 DIAGNOSIS — M25511 Pain in right shoulder: Secondary | ICD-10-CM | POA: Diagnosis not present

## 2022-10-25 NOTE — Progress Notes (Signed)
PCP: Daisy Floro, MD  Subjective:   HPI: Patient is a 57 y.o. female here for right shoulder pain that has essentially resolved.  Patient states that approximately 3 weeks ago she woke up 1 morning and noted that she had some discomfort in her right shoulder.  Patient states that that pain continues to worsen to the point where she could not lift her shoulder more than 10 to 15 degrees above her arm.  Patient states that the pain was significant and that she ended up taking the advice of a physician who recommend doing ibuprofen, ice as well as a sling.  Patient states that a week ago she started to notice that she had increased range of motion and that has accumulated today where she feels like she has full range of motion without any difficulty.  Patient states that there does not feel like there is any movement that is hard for her to do.  Patient states that she is approximately 90% better..   Past Medical History:  Diagnosis Date   Anemia    Back pain    Exertional asthma    Female infertility    GERD (gastroesophageal reflux disease)    High cholesterol    Right leg pain    Vitamin D deficiency     Current Outpatient Medications on File Prior to Visit  Medication Sig Dispense Refill   celecoxib (CELEBREX) 200 MG capsule Take by mouth.     ibuprofen (ADVIL) 200 MG tablet Take 400 mg by mouth every 6 (six) hours as needed for moderate pain.     pantoprazole (PROTONIX) 20 MG tablet Take 1 tablet (20 mg total) by mouth daily. 30 tablet 0   tirzepatide ([name]) 12.5 MG/0.5ML Pen Inject 12.5 mg into the skin once a week. 2 mL 1   Vitamin D, Ergocalciferol, (DRISDOL) 1.25 MG (50000 UNIT) CAPS capsule Take 1 capsule (50,000 Units total) by mouth every 7 (seven) days. 12 capsule 0   No current facility-administered medications on file prior to visit.    Past Surgical History:  Procedure Laterality Date   CESAREAN SECTION     LAPAROSCOPY      No Known Allergies  BP 102/74    Ht 5\' 9"  (1.753 m)   Wt 195 lb (88.5 kg)   LMP 03/27/2013   BMI 28.80 kg/m      12/16/2019   11:43 AM 12/30/2020    3:19 PM  [facility] Adult Exercise  Frequency of aerobic exercise (# of days/week) 5 5  Average time in minutes 45 90  Frequency of strengthening activities (# of days/week) 5 3        No data to display              Objective:  Physical Exam:  Gen: NAD, comfortable in exam room  Shoulder, Right:  No  skin changes, erythema, or ecchymosis noted. No evidence of bony deformity, asymmetry, or muscle atrophy; Tenderness over long head of biceps (bicipital groove). No TTP at [facility] joint. Full active and passive range of motion (180 flex Elisabeth Most /150Abd /90ER /70IR), Thumb to T12 without significant tenderness. Strength 5/5, strength 4/5 with active flexion of the elbow against resistance. No abnormal scapular function observed. Sensation to light touch intact. Peripheral pulses intact. DTR's .   Special Tests:   - Painful Arc absent 0 to 180 degrees   - Empty can: NEG   - Int/Ext Rotation test: NEG   Rush Barer Lift-Off Test:  NEG   - Crossarm Adduction test: NEG   - Hawkins: NEG   - Neer test: NEG   - O'brien's test: NEG   - Yergason's: POS   - Speeds test: POS    U/S Right shoulder: Biceps tendon is intact and well-defined.  Tendon sits well in the groove with no signs of fluid accumulation.  Supraspinatus Tendon: The supraspinatus tendon is well-defined and appears normal. The tendon is of normal thickness without any hypoechoic areas or tears. There is noted calcification consistent with an old injury. The bursal surface is smooth with no evidence of fluid accumulation.  Infraspinatus Tendon: The infraspinatus tendon is  intact and demonstrates normal echogenicity. No thickening, irregularity, or signs of tendinopathy are observed. The tendon attachments to the greater tuberosity are normal.  Teres Minor Tendon: The teres minor tendon is  normal in appearance with no abnormalities. The fibers are well-aligned and demonstrate normal echogenicity.  Subscapularis Tendon: The subscapularis tendon is intact and shows normal structure. No evidence of tendinopathy, tears, or thickening.  Rotator Cuff Muscles: All rotator cuff muscles (supraspinatus, infraspinatus, teres minor, and subscapularis) are well-visualized and show normal muscle texture. No atrophy or abnormal fluid collections are present.  Subacromial-Subdeltoid Bursa: The bursa is normal, with no thickening or fluid accumulation.  AC joint shows narrowing consistent with arthritis, there is also noted mild spurring of the [facility] joint as well as a mushroom sign consistent with fluid accumulation.  Impression: Normal Rotator Cuff Muscles and Tendons: No evidence of tears, tendinopathy, or other abnormalities. AC joint showing osteoarthritis as well as mushroom sign consistent with inflammation    Ultrasound and interpretation by Brenton Grills MD and Sibyl Parr. Fields, MD  Assessment & Plan:  1. 1. Acute pain of right shoulder Given patient's recovery as well as ultrasound findings, patient likely could have been dealing transient frozen shoulder secondary to [facility] joint injury.  There is significant fluid accumulation in the [facility] joint and patient does note that she had a week of significant overhead usage of the right arm.  At this time, given that patient has almost full recovery of the shoulder, we will continue with conservative management which includes topical diclofenac 3-4 times a day.  Will also advised patient that she may resume normal activities such as tennis and exercise however would make sure patient is icing after every exercise.  Will also give patient rotator cuff series and exercises in order to strengthen the rotator cuff muscles which should decrease the incidence of this happening again.  Patient is understanding and agreeable with plan. - [place] COMPLETE JOINT SPACE  STRUCTURES UP RIGHT; Future    Brenton Grills MD, PGY-4  Sports Medicine Fellow [facility]  I observed and examined the patient with the [facility] resident and agree with assessment and plan.  Note reviewed and modified by me. Sterling Big, MD

## 2022-10-26 ENCOUNTER — Ambulatory Visit (INDEPENDENT_AMBULATORY_CARE_PROVIDER_SITE_OTHER): Payer: BC Managed Care – PPO | Admitting: Family Medicine

## 2022-10-26 ENCOUNTER — Ambulatory Visit (INDEPENDENT_AMBULATORY_CARE_PROVIDER_SITE_OTHER): Payer: BC Managed Care – PPO | Admitting: Adult Health

## 2022-11-01 ENCOUNTER — Ambulatory Visit (INDEPENDENT_AMBULATORY_CARE_PROVIDER_SITE_OTHER): Payer: BC Managed Care – PPO | Admitting: Family Medicine

## 2022-11-01 ENCOUNTER — Encounter (INDEPENDENT_AMBULATORY_CARE_PROVIDER_SITE_OTHER): Payer: Self-pay | Admitting: Family Medicine

## 2022-11-01 VITALS — BP 100/67 | HR 65 | Temp 97.8°F | Ht 69.0 in | Wt 196.0 lb

## 2022-11-01 DIAGNOSIS — E669 Obesity, unspecified: Secondary | ICD-10-CM

## 2022-11-01 DIAGNOSIS — Z6829 Body mass index (BMI) 29.0-29.9, adult: Secondary | ICD-10-CM | POA: Diagnosis not present

## 2022-11-01 DIAGNOSIS — R7303 Prediabetes: Secondary | ICD-10-CM

## 2022-11-01 DIAGNOSIS — E559 Vitamin D deficiency, unspecified: Secondary | ICD-10-CM

## 2022-11-01 MED ORDER — VITAMIN D (ERGOCALCIFEROL) 1.25 MG (50000 UNIT) PO CAPS: 50000.0000 [IU] | ORAL_CAPSULE | ORAL | 0 refills | Status: DC

## 2022-11-01 MED ORDER — ZEPBOUND 12.5 MG/0.5ML ~~LOC~~ SOAJ
12.5000 mg | SUBCUTANEOUS | 1 refills | Status: DC
Start: 2022-11-01 — End: 2022-11-28

## 2022-11-01 NOTE — Progress Notes (Unsigned)
Chief Complaint:   OBESITY Sharon [name] is here to discuss her progress with her obesity treatment plan along with follow-up of her obesity related diagnoses. Sharon [name] is on the Category 2 Plan and states she is following her eating plan approximately 40% of the time. Sharon [name] states she is playing Tennis and lifting weights for 45 minutes 5 times per week.  Today's visit was #: 38 Starting weight: 228 lbs Starting date: 10/30/2019 Today's weight: 196 lbs Today's date: 11/01/2022 Total lbs lost to date: 32 Total lbs lost since last in-office visit: 0  Interim History: Patient has done a lot of traveling and eating out. She has been mindful of her protein intake and she tried to portion control.   Subjective:   1. Vitamin D deficiency Patient is on Vitamin D, and her last level was at goal. No side effects were noted.   2. Prediabetes Patient is doing well with [name], and no GI upset was noted. She is working on [employer] and exercise.   Assessment/Plan:   1. Vitamin D deficiency Patient will continue once weekly prescription Vitamin D 50,000 IU, and we will refill for 90 days.   - Vitamin D, Ergocalciferol, (DRISDOL) 1.25 MG (50000 UNIT) CAPS capsule; Take 1 capsule (50,000 Units total) by mouth every 7 (seven) days.  Dispense: 12 capsule; Refill: 0  2. Prediabetes Patient will continue [name] 12.5 mg once weekly, and we will refill for 2 months.   - tirzepatide ([name]) 12.5 MG/0.5ML Pen; Inject 12.5 mg into the skin once a week.  Dispense: 2 mL; Refill: 1  3. BMI 29.0-29.9,adult  4. Obesity, Beginning BMI 33.67 Sharon [name] is currently in the action stage of change. As such, her goal is to continue with weight loss efforts. She has agreed to the Category 2 Plan.   Increase morning breakfast protein, and recipe ideas were given.   Exercise goals: As is.   Behavioral modification strategies: increasing lean protein intake and no skipping meals.  Sharon [name] has agreed to follow-up  with our clinic in 4 weeks. She was informed of the importance of frequent follow-up visits to maximize her success with intensive lifestyle modifications for her multiple health conditions.   Objective:   Blood pressure 100/67, pulse 65, temperature 97.8 F (36.6 C), height 5\' 9"  (1.753 m), weight 196 lb (88.9 kg), last menstrual period 03/27/2013, SpO2 99%. Body mass index is 28.94 kg/m.  Lab Results  Component Value Date   CREATININE 0.82 06/29/2022   BUN 19 06/29/2022   NA 142 06/29/2022   K 4.7 06/29/2022   CL 102 06/29/2022   CO2 24 06/29/2022   Lab Results  Component Value Date   ALT 12 06/29/2022   AST 15 06/29/2022   ALKPHOS 71 06/29/2022   BILITOT 0.2 06/29/2022   Lab Results  Component Value Date   HGBA1C 5.7 (H) 06/29/2022   HGBA1C 5.4 02/01/2022   HGBA1C 5.8 (H) 07/05/2021   HGBA1C 5.7 (H) 01/10/2021   HGBA1C 5.7 (H) 09/02/2020   Lab Results  Component Value Date   INSULIN 25.1 (H) 06/29/2022   INSULIN 18.2 02/01/2022   INSULIN 12.6 07/05/2021   INSULIN 8.1 01/10/2021   INSULIN 14.4 09/02/2020   Lab Results  Component Value Date   TSH 1.460 06/29/2022   Lab Results  Component Value Date   CHOL 198 06/29/2022   HDL 63 06/29/2022   LDLCALC 119 (H) 06/29/2022   TRIG 90 06/29/2022   CHOLHDL 3.1 06/29/2022   Lab  Results  Component Value Date   VD25OH 63.8 06/29/2022   VD25OH 42.4 02/01/2022   VD25OH 36.5 07/05/2021   Lab Results  Component Value Date   WBC 5.4 01/10/2021   HGB 13.4 01/10/2021   HCT 39.9 01/10/2021   MCV 85 01/10/2021   PLT 245 01/10/2021   No results found for: "IRON", "TIBC", "FERRITIN"  Attestation Statements:   Reviewed by clinician on day of visit: allergies, medications, problem list, medical history, surgical history, family history, social history, and previous encounter notes.   I, Burt Knack, am acting as transcriptionist for Quillian Quince, MD.  I have reviewed the above documentation for accuracy and  completeness, and I agree with the above. -  Quillian Quince, MD

## 2022-11-15 ENCOUNTER — Telehealth: Payer: Self-pay | Admitting: *Deleted

## 2022-11-15 NOTE — Telephone Encounter (Signed)
Prior authorization done for [name] via cover my meds. Waiting on determination.

## 2022-11-27 ENCOUNTER — Ambulatory Visit (INDEPENDENT_AMBULATORY_CARE_PROVIDER_SITE_OTHER): Payer: BC Managed Care – PPO | Admitting: Family Medicine

## 2022-11-27 DIAGNOSIS — Z96641 Presence of right artificial hip joint: Secondary | ICD-10-CM | POA: Diagnosis not present

## 2022-11-27 DIAGNOSIS — Z471 Aftercare following joint replacement surgery: Secondary | ICD-10-CM | POA: Diagnosis not present

## 2022-11-28 ENCOUNTER — Ambulatory Visit (INDEPENDENT_AMBULATORY_CARE_PROVIDER_SITE_OTHER): Payer: BC Managed Care – PPO | Admitting: Family Medicine

## 2022-11-28 ENCOUNTER — Encounter (INDEPENDENT_AMBULATORY_CARE_PROVIDER_SITE_OTHER): Payer: Self-pay | Admitting: Family Medicine

## 2022-11-28 VITALS — BP 115/80 | HR 94 | Temp 98.6°F | Ht 69.0 in | Wt 193.0 lb

## 2022-11-28 DIAGNOSIS — R7303 Prediabetes: Secondary | ICD-10-CM

## 2022-11-28 DIAGNOSIS — E559 Vitamin D deficiency, unspecified: Secondary | ICD-10-CM | POA: Diagnosis not present

## 2022-11-28 DIAGNOSIS — E669 Obesity, unspecified: Secondary | ICD-10-CM | POA: Diagnosis not present

## 2022-11-28 DIAGNOSIS — Z6828 Body mass index (BMI) 28.0-28.9, adult: Secondary | ICD-10-CM

## 2022-11-28 DIAGNOSIS — Z6829 Body mass index (BMI) 29.0-29.9, adult: Secondary | ICD-10-CM

## 2022-11-28 MED ORDER — VITAMIN D (ERGOCALCIFEROL) 1.25 MG (50000 UNIT) PO CAPS: 50000.0000 [IU] | ORAL_CAPSULE | ORAL | 0 refills | Status: DC

## 2022-11-28 MED ORDER — ZEPBOUND 12.5 MG/0.5ML ~~LOC~~ SOAJ
12.5000 mg | SUBCUTANEOUS | 1 refills | Status: DC
Start: 2022-11-28 — End: 2023-02-05

## 2022-11-28 NOTE — Progress Notes (Signed)
Sharon [name], D.O.  ABFM, ABOM Specializing in Clinical Bariatric Medicine  Office located at: [address] [place], [place]  [address]   Assessment and Plan:   Medications Discontinued During This Encounter  Medication Reason   tirzepatide ([name]) 12.5 MG/0.5ML Pen Reorder   Vitamin D, Ergocalciferol, (DRISDOL) 1.25 MG (50000 UNIT) CAPS capsule Reorder     Meds ordered this encounter  Medications   Vitamin D, Ergocalciferol, (DRISDOL) 1.25 MG (50000 UNIT) CAPS capsule    Sig: Take 1 capsule (50,000 Units total) by mouth every 7 (seven) days.    Dispense:  12 capsule    Refill:  0    Ov for RF   tirzepatide ([name]) 12.5 MG/0.5ML Pen    Sig: Inject 12.5 mg into the skin once a week.    Dispense:  2 mL    Refill:  1    Will recheck fasting insulin, A1c, FLP, vit D, CMP, etc.... next OV  Vitamin D deficiency Assessment & Plan: Most recent vit D stable at 63.8 on 06/29/22. Pt reports good compliance and tolerance with ERGO 50,000 once every 7 days.   Continue with weight loss efforts and rx high-dose vitamin D. Will refill ERGO today. Will recheck levels next OV.    Prediabetes Assessment & Plan: Most recent Hgb A1c and fasting insulin of 5.7 & 25.1 respectively. Pt currently on [name] 12.5 mg once a week without any adverse GI side effects. Reports adequate clinical response on GLP-1 therapy; feels that hunger and cravings are well controlled.   Will recheck labs next OV. Will refill [name] today; no dose change. Continue with reduced calorie nutritional plan (RCNP).    TREATMENT PLAN FOR OBESITY: BMI 29.0-29.9,adult - current BMI 28.49 Obesity, Beginning BMI 33.67 Assessment & Plan: Sharon [name] is here to discuss her progress with her obesity treatment plan along with follow-up of her obesity related diagnoses. See Medical Weight Management Flowsheet for complete bioelectrical impedance results.  Since last office visit on 11/01/22 patient's  Muscle mass has increased by 1.4 lb. Fat mass has decreased by 5 lb. Total body water has decreased by 2.6 lb.  Counseling done on how various foods will affect these numbers and how to maximize success  Total lbs lost to date: 35 lbs  Total weight loss percentage to date: 15.35%   No change to meal plan - see Subjective  Recommended pt to explore [employer] meal prep.   Behavioral Intervention Additional resources provided today: n/a Evidence-based interventions for health behavior change were utilized today including the discussion of self monitoring techniques, problem-solving barriers and SMART goal setting techniques.   Regarding patient's less desirable eating habits and patterns, we employed the technique of small changes.  Pt will specifically work on: n/a  FOLLOW UP: Return 01/08/23. She was informed of the importance of frequent follow up visits to maximize her success with intensive lifestyle modifications for her multiple health conditions.  Subjective:   Chief complaint: Obesity Sharon [name] is here to discuss her progress with her obesity treatment plan. She is on the Category 2 Plan and states she is following her eating plan approximately 50% of the time. She states she is walking 30 minutes, 3 days per week.  Interval History:  Sharon [name] is here for a follow up office visit. Since last OV, Sharon [name] is down 3 lbs. Pt endorses that kids have been doing well. She is tired from a lot of traveling. She has one more upcoming  trip. Food wise, she averages 120-130 grams protein daily. Feels that hunger and cravings are well controlled.   Pharmacotherapy for weight loss: She is currently taking [name] 12.5 mg once a week for medical weight loss.  Denies side effects.    Review of Systems:  Pertinent positives were addressed with patient today.  Reviewed by clinician on day of visit: allergies, medications, problem list, medical history, surgical history, family history,  social history, and previous encounter notes.  Weight Summary and Biometrics   Weight Lost Since Last Visit: 3lb  Weight Gained Since Last Visit: 0lb   Vitals Temp: 98.6 F (37 C) BP: 115/80 Pulse Rate: 94 SpO2: 98 %   Anthropometric Measurements Height: 5\' 9"  (1.753 m) Weight: 193 lb (87.5 kg) BMI (Calculated): 28.49 Weight at Last Visit: 196 lb Weight Lost Since Last Visit: 3lb Weight Gained Since Last Visit: 0lb Starting Weight: 228 lb Total Weight Loss (lbs): 35 lb (15.9 kg) Peak Weight: 228lb   Body Composition  Body Fat %: 35.7 % Fat Mass (lbs): 69 lbs Muscle Mass (lbs): 117.8 lbs Total Body Water (lbs): 71.4 lbs Visceral Fat Rating : 8   Other Clinical Data Fasting: no Labs: no Today's Visit #: 39 Starting Date: 10/30/19   Objective:   PHYSICAL EXAM: Blood pressure 115/80, pulse 94, temperature 98.6 F (37 C), height 5\' 9"  (1.753 m), weight 193 lb (87.5 kg), last menstrual period 03/27/2013, SpO2 98%. Body mass index is 28.5 kg/m.  General: Well Developed, well nourished, and in no acute distress.  HEENT: Normocephalic, atraumatic Skin: Warm and dry, cap RF less 2 sec, good turgor Chest:  Normal excursion, shape, no gross abn Respiratory: speaking in full sentences, no conversational dyspnea NeuroM-Sk: Ambulates w/o assistance, moves * 4 Psych: A and O *3, insight good, mood-full  DIAGNOSTIC DATA REVIEWED:  BMET    Component Value Date/Time   NA 142 06/29/2022 0824   K 4.7 06/29/2022 0824   CL 102 06/29/2022 0824   CO2 24 06/29/2022 0824   GLUCOSE 85 06/29/2022 0824   GLUCOSE 106 (H) 05/13/2019 0840   BUN 19 06/29/2022 0824   CREATININE 0.82 06/29/2022 0824   CALCIUM 9.3 06/29/2022 0824   GFRNONAA 75 02/16/2020 1252   GFRAA 86 02/16/2020 1252   Lab Results  Component Value Date   HGBA1C 5.7 (H) 06/29/2022   HGBA1C 6.1 (H) 10/30/2019   Lab Results  Component Value Date   INSULIN 25.1 (H) 06/29/2022   INSULIN 20.2 10/30/2019    Lab Results  Component Value Date   TSH 1.460 06/29/2022   CBC    Component Value Date/Time   WBC 5.4 01/10/2021 0917   WBC 5.5 05/13/2019 0840   RBC 4.69 01/10/2021 0917   RBC 4.72 05/13/2019 0840   HGB 13.4 01/10/2021 0917   HCT 39.9 01/10/2021 0917   PLT 245 01/10/2021 0917   MCV 85 01/10/2021 0917   MCH 28.6 01/10/2021 0917   MCH 28.4 05/13/2019 0840   MCHC 33.6 01/10/2021 0917   MCHC 31.8 05/13/2019 0840   RDW 13.4 01/10/2021 0917   Iron Studies No results found for: "IRON", "TIBC", "FERRITIN", "IRONPCTSAT" Lipid Panel     Component Value Date/Time   CHOL 198 06/29/2022 0824   TRIG 90 06/29/2022 0824   HDL 63 06/29/2022 0824   CHOLHDL 3.1 06/29/2022 0824   LDLCALC 119 (H) 06/29/2022 0824   Hepatic Function Panel     Component Value Date/Time   PROT 7.0 06/29/2022 0824  ALBUMIN 4.1 06/29/2022 0824   AST 15 06/29/2022 0824   ALT 12 06/29/2022 0824   ALKPHOS 71 06/29/2022 0824   BILITOT 0.2 06/29/2022 0824      Component Value Date/Time   TSH 1.460 06/29/2022 0824   Nutritional Lab Results  Component Value Date   VD25OH 63.8 06/29/2022   VD25OH 42.4 02/01/2022   VD25OH 36.5 07/05/2021    Attestations:   I, Special Puri, acting as a Stage manager for [employer], DO., have compiled all relevant documentation for today's office visit on behalf of Thomasene Lot, DO, while in the presence of [employer], DO.  I have reviewed the above documentation for accuracy and completeness, and I agree with the above. Sharon [name], D.O.  The 21st Century Cures Act was signed into law in 2016 which includes the topic of electronic health records.  This provides immediate access to information in MyChart.  This includes consultation notes, operative notes, office notes, lab results and pathology reports.  If you have any questions about what you read please let us know at your next visit so we can discuss your concerns and take corrective action if  need be.  We are right here with you.

## 2023-01-08 ENCOUNTER — Ambulatory Visit (INDEPENDENT_AMBULATORY_CARE_PROVIDER_SITE_OTHER): Payer: BC Managed Care – PPO | Admitting: Family Medicine

## 2023-01-30 ENCOUNTER — Other Ambulatory Visit: Payer: Self-pay | Admitting: Obstetrics and Gynecology

## 2023-01-30 DIAGNOSIS — Z1231 Encounter for screening mammogram for malignant neoplasm of breast: Secondary | ICD-10-CM

## 2023-02-05 ENCOUNTER — Ambulatory Visit (INDEPENDENT_AMBULATORY_CARE_PROVIDER_SITE_OTHER): Payer: BC Managed Care – PPO | Admitting: Family Medicine

## 2023-02-05 ENCOUNTER — Encounter (INDEPENDENT_AMBULATORY_CARE_PROVIDER_SITE_OTHER): Payer: Self-pay | Admitting: Family Medicine

## 2023-02-05 VITALS — BP 103/58 | HR 69 | Temp 98.3°F | Ht 69.0 in | Wt 190.0 lb

## 2023-02-05 DIAGNOSIS — E559 Vitamin D deficiency, unspecified: Secondary | ICD-10-CM | POA: Diagnosis not present

## 2023-02-05 DIAGNOSIS — E7849 Other hyperlipidemia: Secondary | ICD-10-CM | POA: Diagnosis not present

## 2023-02-05 DIAGNOSIS — R7303 Prediabetes: Secondary | ICD-10-CM | POA: Diagnosis not present

## 2023-02-05 DIAGNOSIS — Z6828 Body mass index (BMI) 28.0-28.9, adult: Secondary | ICD-10-CM

## 2023-02-05 DIAGNOSIS — E669 Obesity, unspecified: Secondary | ICD-10-CM

## 2023-02-05 MED ORDER — VITAMIN D (ERGOCALCIFEROL) 1.25 MG (50000 UNIT) PO CAPS: 50000.0000 [IU] | ORAL_CAPSULE | ORAL | 0 refills | Status: DC

## 2023-02-05 MED ORDER — ZEPBOUND 12.5 MG/0.5ML ~~LOC~~ SOAJ
12.5000 mg | SUBCUTANEOUS | 1 refills | Status: DC
Start: 2023-02-05 — End: 2023-02-06

## 2023-02-05 NOTE — Progress Notes (Signed)
 Sharon [name], D.O.  ABFM, ABOM Specializing in Clinical Bariatric Medicine  Office located at: [address] [place], [place]  [address]   Assessment and Plan:   Pt will return at earliest convenience for labs - BMP, Hgb A1C, insulin , lipid panel, T4, and TSH. Results to be reviewed at her next visit.   FOR THE DISEASE OF OBESITY: Obesity, Beginning BMI 33.67 BMI 28.0-28.9,adult -- Current BMI 28.05 Assessment & Plan: Since last office visit on 11/28/22 patient's muscle mass has decreased by 0.8lb. Fat mass has decreased by 1.4lb. Total body water has decreased by 0.2lb.  Counseling done on how various foods will affect these numbers and how to maximize success  Total lbs lost to date: 38 lbs  Total weight loss percentage to date:  -16.67%   Recommended Dietary Goals Sharon [name] is currently in the action stage of change. As such, her goal is to continue weight management plan.  She has agreed to: continue current plan   Behavioral Intervention We discussed the following today: increasing lean protein intake to established goals, decreasing simple carbohydrates , avoiding skipping meals, increasing water intake , keeping healthy foods at home, staying on track while traveling and vacationing, and continue to work on maintaining a reduced calorie state, getting the recommended amount of protein, incorporating whole foods, making healthy choices, staying well hydrated and practicing mindfulness when eating.  Additional resources provided today: None and Handout on complex carbohydrates and lean sources of protein  Evidence-based interventions for health behavior change were utilized today including the discussion of self monitoring techniques, problem-solving barriers and SMART goal setting techniques.   Regarding patient's less desirable eating habits and patterns, we employed the technique of small changes.   Pt will specifically work on: stay on plan while on her cruise for  next visit.    Recommended Physical Activity Goals Zyion has been advised to work up to 150 minutes of moderate intensity aerobic activity a week and strengthening exercises 2-3 times per week for cardiovascular health, weight loss maintenance and preservation of muscle mass.   She has agreed to :  Continue current level of physical activity    Pharmacotherapy We discussed various medication options to help Kei with her weight loss efforts and we both agreed to: continue with nutritional and behavioral strategies and continue current anti-obesity medication regimen   FOR ASSOCIATED CONDITIONS ADDRESSED TODAY: Prediabetes Assessment & Plan: Lab Results  Component Value Date   HGBA1C 5.7 (H) 06/29/2022   HGBA1C 5.4 02/01/2022   HGBA1C 5.8 (H) 07/05/2021   INSULIN  25.1 (H) 06/29/2022   INSULIN  18.2 02/01/2022   INSULIN  12.6 07/05/2021    She is compliant with [name]  12.5 mg once weekly, tolerating well. Denies any loose stools, cramping, acid reflux, or any other side effects. She has been prioritizing increasing her protein intake but is still working on decreasing her simple carb/sugar intake. Taking at least 10K steps daily since the start of December. On days she isn't playing tennis she walks to meet her step goal and has accountability partner to help keep her motivated to meet her step goal daily.   Pt would prefer to continue with [name]  at current dose given she is going on cruise soon and doesn't want to get on new meds prior to her trip. I recommend she avoid skipping any meals. Encouraged pt to meet her protein goal and follow her meal plan while traveling.   Orders: - Refill [name] , no dose changes today.  -  Recheck BMP - Recheck Hgb A1C - Recheck fasting insulin  - Recheck T4 - Recheck TSH   Other hyperlipidemia Assessment & Plan: Lab Results  Component Value Date   CHOL 198 06/29/2022   HDL 63 06/29/2022   LDLCALC 119 (H) 06/29/2022   TRIG 90 06/29/2022    CHOLHDL 3.1 06/29/2022   Pt LDL was above goal at 119 per latest labs on 06/29/22. Not currently on any medications for HLD. Diet/exercise approach. She has been prioritizing her protein intake to meet her daily goal. Still working on decreasing her carb intake.   Encouraged pt to avoid simple carbs/sugars. Continue to exercise and stay properly hydrated as she stays active. Drink at least half her body weight in ounces of water daily. Focus on eating lean proteins and avoid any fatty meats. We will order lipid panel today, pt will return at a later date for labs. Will review labs at next OV.   Orders:  - Recheck Lipid panel    Vitamin D  deficiency Assessment & Plan: Lab Results  Component Value Date   VD25OH 63.8 06/29/2022   VD25OH 42.4 02/01/2022   VD25OH 36.5 07/05/2021   Last vitamin D  was at goal at 63.8. Pt is on ERGO 50K units once weekly. Tolerating well, no side effects. Continue with current supplementation, no dose changes made today. Ordering vitamin D  today, will review labs at her next office visit.   Orders: - Refill ERGO - Recheck vitamin D  today.     Follow up:   Return in about 2 months (around 04/05/2023). She was informed of the importance of frequent follow up visits to maximize her success with intensive lifestyle modifications for her multiple health conditions.  Subjective:   Chief complaint: Obesity Sharon [name] is here to discuss her progress with her obesity treatment plan. She is on the Category 2 Plan and states she is following her eating plan approximately 40% of the time. She states she is doing cardio and strength training for 75 minutes 5 days per week cumulatively. She reports playing tennis regularly, played about 3 hours today prior to her visit. Doing her strength training for 45 minutes 2 days a week with a trainer.  Interval History:  Sharon [name] is here for a follow up office visit. Since last OV, she is down 3 lbs. She has increased her  protein intake and is still working on decreasing her simple carb intake.    Barriers identified: having difficulty focusing on healthy eating.   Pharmacotherapy for weight loss: She is currently taking [name]  with adequate clinical response  and without side effects..   Review of Systems:  Pertinent positives were addressed with patient today.  Reviewed by clinician on day of visit: allergies, medications, problem list, medical history, surgical history, family history, social history, and previous encounter notes.  Weight Summary and Biometrics   Weight Lost Since Last Visit: 3 lb  Weight Gained Since Last Visit: 0 lb    Vitals Temp: 98.3 F (36.8 C) BP: (!) 103/58 Pulse Rate: 69 SpO2: 97 %   Anthropometric Measurements Height: 5' 9 (1.753 m) Weight: 190 lb (86.2 kg) BMI (Calculated): 28.05 Weight at Last Visit: 193 lb Weight Lost Since Last Visit: 3 lb Weight Gained Since Last Visit: 0 lb Starting Weight: 228 lb Total Weight Loss (lbs): 38 lb (17.2 kg) Peak Weight: 228 lb   Body Composition  Body Fat %: 35.4 % Fat Mass (lbs): 67.6 lbs Muscle Mass (lbs): 117 lbs Total Body Water (  lbs): 71.2 lbs Visceral Fat Rating : 8   Other Clinical Data Fasting: No Labs: No Today's Visit #: 40 Starting Date: 11/19/19    Objective:   PHYSICAL EXAM: Blood pressure (!) 103/58, pulse 69, temperature 98.3 F (36.8 C), height 5' 9 (1.753 m), weight 190 lb (86.2 kg), last menstrual period 03/27/2013, SpO2 97%. Body mass index is 28.06 kg/m.  General: she is overweight, cooperative and in no acute distress. PSYCH: Has normal mood, affect and thought process.   HEENT: EOMI, sclerae are anicteric. Lungs: Normal breathing effort, no conversational dyspnea. Extremities: Moves * 4 Neurologic: A and O * 3, good insight  DIAGNOSTIC DATA REVIEWED: BMET    Component Value Date/Time   NA 142 06/29/2022 0824   K 4.7 06/29/2022 0824   CL 102 06/29/2022 0824   CO2 24  06/29/2022 0824   GLUCOSE 85 06/29/2022 0824   GLUCOSE 106 (H) 05/13/2019 0840   BUN 19 06/29/2022 0824   CREATININE 0.82 06/29/2022 0824   CALCIUM 9.3 06/29/2022 0824   GFRNONAA 75 02/16/2020 1252   GFRAA 86 02/16/2020 1252   Lab Results  Component Value Date   HGBA1C 5.7 (H) 06/29/2022   HGBA1C 6.1 (H) 10/30/2019   Lab Results  Component Value Date   INSULIN  25.1 (H) 06/29/2022   INSULIN  20.2 10/30/2019   Lab Results  Component Value Date   TSH 1.460 06/29/2022   CBC    Component Value Date/Time   WBC 5.4 01/10/2021 0917   WBC 5.5 05/13/2019 0840   RBC 4.69 01/10/2021 0917   RBC 4.72 05/13/2019 0840   HGB 13.4 01/10/2021 0917   HCT 39.9 01/10/2021 0917   PLT 245 01/10/2021 0917   MCV 85 01/10/2021 0917   MCH 28.6 01/10/2021 0917   MCH 28.4 05/13/2019 0840   MCHC 33.6 01/10/2021 0917   MCHC 31.8 05/13/2019 0840   RDW 13.4 01/10/2021 0917   Iron Studies No results found for: IRON, TIBC, FERRITIN, IRONPCTSAT Lipid Panel     Component Value Date/Time   CHOL 198 06/29/2022 0824   TRIG 90 06/29/2022 0824   HDL 63 06/29/2022 0824   CHOLHDL 3.1 06/29/2022 0824   LDLCALC 119 (H) 06/29/2022 0824   Hepatic Function Panel     Component Value Date/Time   PROT 7.0 06/29/2022 0824   ALBUMIN 4.1 06/29/2022 0824   AST 15 06/29/2022 0824   ALT 12 06/29/2022 0824   ALKPHOS 71 06/29/2022 0824   BILITOT 0.2 06/29/2022 0824      Component Value Date/Time   TSH 1.460 06/29/2022 0824   Nutritional Lab Results  Component Value Date   VD25OH 63.8 06/29/2022   VD25OH 42.4 02/01/2022   VD25OH 36.5 07/05/2021    Attestations:   I, Vernell Forest , acting as a stage manager for Sharon Jenkins, DO., have compiled all relevant documentation for today's office visit on behalf of Sharon Jenkins, DO, while in the presence of [employer], DO.  Reviewed by clinician on day of visit: allergies, medications, problem list, medical history, surgical history, family  history, social history, and previous encounter notes pertinent to patient's obesity diagnosis.  I have reviewed the above documentation for accuracy and completeness, and I agree with the above. Sharon JINNY [name], D.O.  The 21st Century Cures Act was signed into law in 2016 which includes the topic of electronic health records.  This provides immediate access to information in MyChart.  This includes consultation notes, operative notes, office notes, lab results and  pathology reports.  If you have any questions about what you read please let us  know at your next visit so we can discuss your concerns and take corrective action if need be.  We are right here with you.

## 2023-02-06 DIAGNOSIS — E7849 Other hyperlipidemia: Secondary | ICD-10-CM | POA: Diagnosis not present

## 2023-02-06 DIAGNOSIS — E559 Vitamin D deficiency, unspecified: Secondary | ICD-10-CM | POA: Diagnosis not present

## 2023-02-06 DIAGNOSIS — R7303 Prediabetes: Secondary | ICD-10-CM | POA: Diagnosis not present

## 2023-02-06 MED ORDER — ZEPBOUND 12.5 MG/0.5ML ~~LOC~~ SOAJ: 12.5000 mg | SUBCUTANEOUS | 1 refills | Status: DC

## 2023-02-07 LAB — INSULIN, RANDOM: INSULIN: 19.2 u[IU]/mL (ref 2.6–24.9)

## 2023-02-07 LAB — BASIC METABOLIC PANEL
BUN/Creatinine Ratio: 22 (ref 9–23)
BUN: 19 mg/dL (ref 6–24)
CO2: 23 mmol/L (ref 20–29)
Calcium: 9.6 mg/dL (ref 8.7–10.2)
Chloride: 102 mmol/L (ref 96–106)
Creatinine, Ser: 0.85 mg/dL (ref 0.57–1.00)
Glucose: 88 mg/dL (ref 70–99)
Potassium: 4.6 mmol/L (ref 3.5–5.2)
Sodium: 139 mmol/L (ref 134–144)
eGFR: 80 mL/min/{1.73_m2} (ref >59)

## 2023-02-07 LAB — LIPID PANEL
Chol/HDL Ratio: 3 {ratio} (ref 0.0–4.4)
Cholesterol, Total: 207 mg/dL — ABNORMAL HIGH (ref 100–199)
HDL: 69 mg/dL (ref >39)
LDL Chol Calc (NIH): 124 mg/dL — ABNORMAL HIGH (ref 0–99)
Triglycerides: 81 mg/dL (ref 0–149)
VLDL Cholesterol Cal: 14 mg/dL (ref 5–40)

## 2023-02-07 LAB — HEMOGLOBIN A1C
Est. average glucose Bld gHb Est-mCnc: 114 mg/dL
Hgb A1c MFr Bld: 5.6 % (ref 4.8–5.6)

## 2023-02-07 LAB — T4, FREE: Free T4: 1.17 ng/dL (ref 0.82–1.77)

## 2023-02-07 LAB — VITAMIN D 25 HYDROXY (VIT D DEFICIENCY, FRACTURES): Vit D, 25-Hydroxy: 39.8 ng/mL (ref 30.0–100.0)

## 2023-02-07 LAB — TSH: TSH: 1.89 u[IU]/mL (ref 0.450–4.500)

## 2023-02-09 DIAGNOSIS — Z0000 Encounter for general adult medical examination without abnormal findings: Secondary | ICD-10-CM | POA: Diagnosis not present

## 2023-02-13 ENCOUNTER — Ambulatory Visit
Admission: RE | Admit: 2023-02-13 | Discharge: 2023-02-13 | Disposition: A | Discharge status: Home / Self Care | Payer: BC Managed Care – PPO | Source: Ambulatory Visit | Attending: Obstetrics and Gynecology | Admitting: Obstetrics and Gynecology

## 2023-02-13 DIAGNOSIS — Z1231 Encounter for screening mammogram for malignant neoplasm of breast: Secondary | ICD-10-CM

## 2023-02-14 DIAGNOSIS — Z0000 Encounter for general adult medical examination without abnormal findings: Secondary | ICD-10-CM | POA: Diagnosis not present

## 2023-02-19 ENCOUNTER — Telehealth (INDEPENDENT_AMBULATORY_CARE_PROVIDER_SITE_OTHER): Payer: Self-pay | Admitting: Family Medicine

## 2023-02-19 ENCOUNTER — Ambulatory Visit (INDEPENDENT_AMBULATORY_CARE_PROVIDER_SITE_OTHER): Payer: BC Managed Care – PPO | Admitting: Family Medicine

## 2023-02-19 ENCOUNTER — Encounter (INDEPENDENT_AMBULATORY_CARE_PROVIDER_SITE_OTHER): Payer: Self-pay | Admitting: Family Medicine

## 2023-02-19 NOTE — Telephone Encounter (Signed)
Pt need prior auth for zetbound, going out of town Wednesday and needs med

## 2023-02-20 ENCOUNTER — Telehealth (INDEPENDENT_AMBULATORY_CARE_PROVIDER_SITE_OTHER): Payer: Self-pay | Admitting: *Deleted

## 2023-02-20 NOTE — Telephone Encounter (Signed)
Pt called inquiring about her [name] prior authorization. Pt reports she is going out of the country tomorrow morning and she needs her medication before leaving. Please follow up with the pt.

## 2023-02-20 NOTE — Telephone Encounter (Signed)
Nicki Reaper (Key: [id number])  Your information has been sent to [facility].  PA submitted via CovermyMeds

## 2023-03-27 DIAGNOSIS — H0102A Squamous blepharitis right eye, upper and lower eyelids: Secondary | ICD-10-CM | POA: Diagnosis not present

## 2023-03-27 DIAGNOSIS — H0102B Squamous blepharitis left eye, upper and lower eyelids: Secondary | ICD-10-CM | POA: Diagnosis not present

## 2023-03-27 DIAGNOSIS — H2513 Age-related nuclear cataract, bilateral: Secondary | ICD-10-CM | POA: Diagnosis not present

## 2023-04-02 ENCOUNTER — Ambulatory Visit (INDEPENDENT_AMBULATORY_CARE_PROVIDER_SITE_OTHER): Payer: BC Managed Care – PPO | Admitting: Family Medicine

## 2023-04-02 ENCOUNTER — Encounter (INDEPENDENT_AMBULATORY_CARE_PROVIDER_SITE_OTHER): Payer: Self-pay | Admitting: Family Medicine

## 2023-04-02 VITALS — BP 100/62 | HR 61 | Temp 98.0°F | Ht 69.0 in | Wt 189.0 lb

## 2023-04-02 DIAGNOSIS — E559 Vitamin D deficiency, unspecified: Secondary | ICD-10-CM

## 2023-04-02 DIAGNOSIS — R7303 Prediabetes: Secondary | ICD-10-CM

## 2023-04-02 DIAGNOSIS — Z6828 Body mass index (BMI) 28.0-28.9, adult: Secondary | ICD-10-CM

## 2023-04-02 DIAGNOSIS — E7849 Other hyperlipidemia: Secondary | ICD-10-CM

## 2023-04-02 DIAGNOSIS — E669 Obesity, unspecified: Secondary | ICD-10-CM | POA: Diagnosis not present

## 2023-04-02 DIAGNOSIS — Z6827 Body mass index (BMI) 27.0-27.9, adult: Secondary | ICD-10-CM

## 2023-04-02 MED ORDER — VITAMIN D (ERGOCALCIFEROL) 1.25 MG (50000 UNIT) PO CAPS: 50000.0000 [IU] | ORAL_CAPSULE | ORAL | 0 refills | Status: DC

## 2023-04-02 MED ORDER — ZEPBOUND 12.5 MG/0.5ML ~~LOC~~ SOAJ: 12.5000 mg | SUBCUTANEOUS | 1 refills | Status: DC

## 2023-04-02 NOTE — Patient Instructions (Signed)
 The 10-year ASCVD risk score (Arnett DK, et al., 2019) is: 1.5%   Values used to calculate the score:     Age: 58 years     Sex: Female     Is Non-Hispanic African American: Yes     Diabetic: No     Tobacco smoker: No     Systolic Blood Pressure: 100 mmHg     Is BP treated: No     HDL Cholesterol: 69 mg/dL     Total Cholesterol: 207 mg/dL

## 2023-04-02 NOTE — Progress Notes (Signed)
 [name], D.O.  ABFM, ABOM Specializing in Clinical Bariatric Medicine  Office located at: [address] [place], [place]  [address]   Assessment and Plan:   FOR THE DISEASE OF OBESITY: BMI 27.0-27.9,adult -- Current BMI 27.9 Obesity, Beginning BMI 33.67 Assessment & Plan: Since last office visit on 02/05/2023, patient's muscle mass has increased by 2.2 lbs. Fat mass has decreased by 3.4 lbs. Total body water has decreased by 1 lb.  Counseling done on how various foods will affect these numbers and how to maximize success  Total lbs lost to date: 39 lbs Total weight loss percentage to date: 17.11%    Recommended Dietary Goals Sharon [name] is currently in the action stage of change. As such, her goal is to continue weight management plan.  She has agreed to: continue current plan   Behavioral Intervention We discussed the following today: using GPT or another AI platform for recipe ideas- searching "low calorie, low carb, high protein chicken recipes" etc  Additional resources provided today: Personalized instruction on the use of artificial intelligence for recipes, calorie tracking, and finding healthier options when eating out. , High protein low carb indian recipes, High magnesium foods  Evidence-based interventions for health behavior change were utilized today including the discussion of self monitoring techniques, problem-solving barriers and SMART goal setting techniques.   Regarding patient's less desirable eating habits and patterns, we employed the technique of small changes.   Pt will specifically work on: n/a   Recommended Physical Activity Goals Sharon [name] has been advised to work up to 150 minutes of moderate intensity aerobic activity a week and strengthening exercises 2-3 times per week for cardiovascular health, weight loss maintenance and preservation of muscle mass.   She has agreed to:  Continue current level of physical activity    Pharmacotherapy We  both agreed to : continue with nutritional and behavioral strategies and current medication regimen   FOR ASSOCIATED CONDITIONS ADDRESSED TODAY:  Prediabetes Assessment & Plan: Lab Results  Component Value Date   HGBA1C 5.6 02/06/2023   HGBA1C 5.7 (H) 06/29/2022   HGBA1C 5.4 02/01/2022   INSULIN 19.2 02/06/2023   INSULIN 25.1 (H) 06/29/2022   INSULIN 18.2 02/01/2022   Lab Results  Component Value Date   CREATININE 0.85 02/06/2023   BUN 19 02/06/2023   NA 139 02/06/2023   K 4.6 02/06/2023   CL 102 02/06/2023   CO2 23 02/06/2023      Component Value Date/Time   PROT 7.0 06/29/2022 0824   ALBUMIN 4.1 06/29/2022 0824   AST 15 06/29/2022 0824   ALT 12 06/29/2022 0824   ALKPHOS 71 06/29/2022 0824   BILITOT 0.2 06/29/2022 0824   Lab Results  Component Value Date   TSH 1.890 02/06/2023   FREET4 1.17 02/06/2023   T3TOTAL 114 10/30/2019  Sharon [name] is on [name] 12.5 mg once weekly. Tolerating well, no GI upset. Per last obtained labs, A1c has improved from 5.7 to 5.6 and pt is no longer in PreDM range. Insulin has also greatly improved from 25.1 to 19.2, however still almost 4 times the goal of <5. Thyroid, kidney, and liver enzymes WNL. I recommended the patient to drink approximately half of their body weight in ounces of water daily. Additionally, have an extra bottle of water for every 30 minutes of exercise. Continue to decrease simple carbs/ sugars; increase fiber and proteins -> follow her meal plan. Will monitor condition to continue seeing improvement.   Relevant Orders: -  [name]; Inject 12.5 mg into the skin once a week.  Dispense: 2 mL; Refill: 1   Other hyperlipidemia Assessment & Plan: Lab Results  Component Value Date   CHOL 207 (H) 02/06/2023   HDL 69 02/06/2023   LDLCALC 124 (H) 02/06/2023   TRIG 81 02/06/2023   CHOLHDL 3.0 02/06/2023  The 10-year ASCVD risk score (Arnett DK, et al., 2019) is: 1.5%   Values used to calculate the score:     Age: 58 years     Sex: Female     Is Non-Hispanic African American: Yes     Diabetic: No     Tobacco smoker: No     Systolic Blood Pressure: 100 mmHg     Is BP treated: No     HDL Cholesterol: 69 mg/dL     Total Cholesterol: 207 mg/dL Tearia is not currently on any medications for this condition. Diet/exercise approach. Per last obtained labs, LDL has worsened from 119 to 124. Will not initiate medications d/t ascvd score being low at 1.5%. I stressed the importance that patient continue with our prudent nutritional plan that is low in saturated and trans fats, and low in fatty carbs to improve these numbers. Will monitor condition closely.    Vitamin D deficiency Assessment & Plan: Lab Results  Component Value Date   VD25OH 39.8 02/06/2023  Sharon [name] is taking ERGO 50,000 once weekly. Reviewed last obtained labs with pt, Vit D was below goal of 50-70. She admits to missing doses and does not get outside frequently in the cold weather. Sharon [name] reports she will be outside more in the near future with tennis, Will not make any changes in supplementation. Encouraged pt to take supplement regularly. Will recheck levels 3 months from last obtained results to observe changes.  Relevant Orders: -     Vitamin D (Ergocalciferol); Take 1 capsule (50,000 Units total) by mouth every 7 (seven) days.  Dispense: 12 capsule; Refill: 0  Follow up:   Return in about 8 weeks (around 05/28/2023). She was informed of the importance of frequent follow up visits to maximize her success with intensive lifestyle modifications for her multiple health conditions.  Subjective:   Chief complaint: Obesity Sharon [name] is here to discuss her progress with her obesity treatment plan. She is on the the Category 2 Plan and states she is following her eating plan approximately 30% of the time. She states she is playing tennis, walking, or weight lifting 60 minutes 5 days per week.  Interval History:  Sharon [name] is here for a follow up  office visit. Since last OV on 02/05/2023, Sharon [name] is down 1 lb. She recently traveled on a cruise to [place], [place], [place], and [place]. She did a lot of walking during this trip. Pt reports getting 10,000 steps daily since Dec 1.    Pharmacotherapy for weight loss: She is currently taking [name] 12.5 mg once weekly.   Review of Systems:  Pertinent positives were addressed with patient today.  Reviewed by clinician on day of visit: allergies, medications, problem list, medical history, surgical history, family history, social history, and previous encounter notes.  Weight Summary and Biometrics   Weight Lost Since Last Visit: 1 lb  Weight Gained Since Last Visit: 0   Vitals Temp: 98 F (36.7 C) BP: 100/62 Pulse Rate: 61 SpO2: 94 %   Anthropometric Measurements Height: 5\' 9"  (1.753 m) Weight: 189 lb (85.7 kg) BMI (Calculated): 27.9 Weight at Last Visit: 190 lb Weight Lost Since  Last Visit: 1 lb Weight Gained Since Last Visit: 0 Starting Weight: 228 lb Total Weight Loss (lbs): 39 lb (17.7 kg) Peak Weight: 228 lb   Body Composition  Body Fat %: 33.8 % Fat Mass (lbs): 64.2 lbs Muscle Mass (lbs): 119.2 lbs Total Body Water (lbs): 70.2 lbs Visceral Fat Rating : 8   Other Clinical Data Fasting: No Labs: No Today's Visit #: 41 Starting Date: 11/19/19    Objective:   PHYSICAL EXAM: Blood pressure 100/62, pulse 61, temperature 98 F (36.7 C), height 5\' 9"  (1.753 m), weight 189 lb (85.7 kg), last menstrual period 03/27/2013, SpO2 94%. Body mass index is 27.91 kg/m.  General: she is overweight, cooperative and in no acute distress. PSYCH: Has normal mood, affect and thought process.   HEENT: EOMI, sclerae are anicteric. Lungs: Normal breathing effort, no conversational dyspnea. Extremities: Moves * 4 Neurologic: A and O * 3, good insight  DIAGNOSTIC DATA REVIEWED: BMET    Component Value Date/Time   NA 139 02/06/2023 0823   K 4.6 02/06/2023 0823    CL 102 02/06/2023 0823   CO2 23 02/06/2023 0823   GLUCOSE 88 02/06/2023 0823   GLUCOSE 106 (H) 05/13/2019 0840   BUN 19 02/06/2023 0823   CREATININE 0.85 02/06/2023 0823   CALCIUM 9.6 02/06/2023 0823   GFRNONAA 75 02/16/2020 1252   GFRAA 86 02/16/2020 1252   Lab Results  Component Value Date   HGBA1C 5.6 02/06/2023   HGBA1C 6.1 (H) 10/30/2019   Lab Results  Component Value Date   INSULIN 19.2 02/06/2023   INSULIN 20.2 10/30/2019   Lab Results  Component Value Date   TSH 1.890 02/06/2023   CBC    Component Value Date/Time   WBC 5.4 01/10/2021 0917   WBC 5.5 05/13/2019 0840   RBC 4.69 01/10/2021 0917   RBC 4.72 05/13/2019 0840   HGB 13.4 01/10/2021 0917   HCT 39.9 01/10/2021 0917   PLT 245 01/10/2021 0917   MCV 85 01/10/2021 0917   MCH 28.6 01/10/2021 0917   MCH 28.4 05/13/2019 0840   MCHC 33.6 01/10/2021 0917   MCHC 31.8 05/13/2019 0840   RDW 13.4 01/10/2021 0917   Iron Studies No results found for: "IRON", "TIBC", "FERRITIN", "IRONPCTSAT" Lipid Panel     Component Value Date/Time   CHOL 207 (H) 02/06/2023 0823   TRIG 81 02/06/2023 0823   HDL 69 02/06/2023 0823   CHOLHDL 3.0 02/06/2023 0823   LDLCALC 124 (H) 02/06/2023 0823   Hepatic Function Panel     Component Value Date/Time   PROT 7.0 06/29/2022 0824   ALBUMIN 4.1 06/29/2022 0824   AST 15 06/29/2022 0824   ALT 12 06/29/2022 0824   ALKPHOS 71 06/29/2022 0824   BILITOT 0.2 06/29/2022 0824      Component Value Date/Time   TSH 1.890 02/06/2023 0823   Nutritional Lab Results  Component Value Date   VD25OH 39.8 02/06/2023   VD25OH 63.8 06/29/2022   VD25OH 42.4 02/01/2022    Attestations:   I, Camryn Mix, acting as a Stage manager for [employer], DO., have compiled all relevant documentation for today's office visit on behalf of Thomasene Lot, DO, while in the presence of [employer], DO.  Reviewed by clinician on day of visit: allergies, medications, problem list, medical history,  surgical history, family history, social history, and previous encounter notes pertinent to patient's obesity diagnosis. I have spent 40 minutes in the care of the patient today including: preparing to see  patient (e.g. review and interpretation of tests, old notes ), obtaining and/or reviewing separately obtained history, performing a medically appropriate examination or evaluation, counseling and educating the patient, ordering medications, test or procedures, documenting clinical information in the electronic or other health care record, and independently interpreting results and communicating results to the patient, family, or caregiver   I have reviewed the above documentation for accuracy and completeness, and I agree with the above. [name], D.O.  The 21st Century Cures Act was signed into law in 2016 which includes the topic of electronic health records.  This provides immediate access to information in MyChart.  This includes consultation notes, operative notes, office notes, lab results and pathology reports.  If you have any questions about what you read please let us know at your next visit so we can discuss your concerns and take corrective action if need be.  We are right here with you.

## 2023-05-01 DIAGNOSIS — N951 Menopausal and female climacteric states: Secondary | ICD-10-CM | POA: Diagnosis not present

## 2023-05-01 DIAGNOSIS — J069 Acute upper respiratory infection, unspecified: Secondary | ICD-10-CM | POA: Diagnosis not present

## 2023-05-01 DIAGNOSIS — Z1330 Encounter for screening examination for mental health and behavioral disorders, unspecified: Secondary | ICD-10-CM | POA: Diagnosis not present

## 2023-05-01 DIAGNOSIS — Z01419 Encounter for gynecological examination (general) (routine) without abnormal findings: Secondary | ICD-10-CM | POA: Diagnosis not present

## 2023-05-28 ENCOUNTER — Encounter (INDEPENDENT_AMBULATORY_CARE_PROVIDER_SITE_OTHER): Payer: Self-pay | Admitting: Family Medicine

## 2023-05-28 ENCOUNTER — Ambulatory Visit (INDEPENDENT_AMBULATORY_CARE_PROVIDER_SITE_OTHER): Admitting: Family Medicine

## 2023-05-28 VITALS — BP 89/57 | HR 66 | Temp 97.8°F | Ht 69.0 in | Wt 191.0 lb

## 2023-05-28 DIAGNOSIS — E559 Vitamin D deficiency, unspecified: Secondary | ICD-10-CM | POA: Diagnosis not present

## 2023-05-28 DIAGNOSIS — R7303 Prediabetes: Secondary | ICD-10-CM

## 2023-05-28 DIAGNOSIS — Z6828 Body mass index (BMI) 28.0-28.9, adult: Secondary | ICD-10-CM | POA: Diagnosis not present

## 2023-05-28 DIAGNOSIS — E66811 Obesity, class 1: Secondary | ICD-10-CM

## 2023-05-28 MED ORDER — VITAMIN D (ERGOCALCIFEROL) 1.25 MG (50000 UNIT) PO CAPS: 50000.0000 [IU] | ORAL_CAPSULE | ORAL | 0 refills | Status: DC

## 2023-05-28 MED ORDER — ZEPBOUND 12.5 MG/0.5ML ~~LOC~~ SOAJ: 12.5000 mg | SUBCUTANEOUS | 2 refills | Status: DC

## 2023-05-28 NOTE — Progress Notes (Signed)
 Rae Bugler, D.O.  ABFM, ABOM Specializing in Clinical Bariatric Medicine  Office located at: [address] [place], [place]  [address]   Assessment and Plan:  No orders of the defined types were placed in this encounter.  Medications Discontinued During This Encounter  Medication Reason   Vitamin D , Ergocalciferol , (DRISDOL ) 1.25 MG (50000 UNIT) CAPS capsule Reorder   tirzepatide  ([name] ) 12.5 MG/0.5ML Pen Reorder    Meds ordered this encounter  Medications   Vitamin D , Ergocalciferol , (DRISDOL ) 1.25 MG (50000 UNIT) CAPS capsule    Sig: Take 1 capsule (50,000 Units total) by mouth every 7 (seven) days.    Dispense:  12 capsule    Refill:  0    Ov for RF   tirzepatide  ([name] ) 12.5 MG/0.5ML Pen    Sig: Inject 12.5 mg into the skin once a week.    Dispense:  2 mL    Refill:  2     FOR THE DISEASE OF OBESITY:  BMI 28.0-28.9,adult -- Current BMI 28.19 Class 1 obesity with serious comorbidity and body mass index (BMI) of 33.0 to 33.9 in adult, unspecified obesity type Assessment & Plan: Since last office visit on 04/02/2023, patient's muscle mass has decreased by 0.8 lbs. Fat mass has increased by 2.8 lbs. Total body water has increased by 3 lb.  Counseling done on how various foods will affect these numbers and how to maximize success  Total lbs lost to date: 37 lbs Total weight loss percentage to date: 16.23%    Recommended Dietary Goals Sharon [name] is currently in the action stage of change. As such, her goal is to continue weight management plan.  She has agreed to: Journal 1400-1500 cal with 100+g protein daily using Cat 2 MP as a guide   Behavioral Intervention We discussed the following today: Alternative protein options that aren't meat, increasing lean protein intake to established goals and discussed pre-packaged healthier meals such as [employer] All Natural, Whole 30 chicken meals, Longlife meal prep and Factor Meals etc  Additional resources provided  today: Handout on Common Characteristics of Successful Weight Losers,  Evidence-based interventions for health behavior change were utilized today including the discussion of self monitoring techniques, problem-solving barriers and SMART goal setting techniques.   Regarding patient's less desirable eating habits and patterns, we employed the technique of small changes.   Pt will specifically work on: n/a   Recommended Physical Activity Goals Sharon [name] has been advised to work up to 300-450 minutes of moderate intensity aerobic activity a week and strengthening exercises 2-3 times per week for cardiovascular health, weight loss maintenance and preservation of muscle mass.   She has agreed to :  Continue current level of physical activity    Pharmacotherapy We both agreed to : continue with nutritional and behavioral strategies and current medication regimen   ASSOCIATED CONDITIONS ADDRESSED TODAY:  Vitamin D  deficiency Assessment & Plan: Sharon [name] is prescribed ERGO 50,000 units once weekly, however she admits to not taking her supplement regularly. Reminded pt of benefits of optimal Vit D levels including increased energy, mood, and immune system health. Encouraged pt to take supplement as prescribed. Will refill today.   Relevant Orders: -     Vitamin D  (Ergocalciferol ); Take 1 capsule (50,000 Units total) by mouth every 7 (seven) days.  Dispense: 12 capsule; Refill: 0   Prediabetes Assessment & Plan: Sharon [name] is on [name]  12.5 mg once weekly. She is tolerating the medication well, but sometimes doesn't feel the medication is working.  Discussed potentially increasing [name]  dose. Pt prefers to stay at current dose for now. Sharon [name] will continue to work on weight loss, exercise, via their meal plan we devised to help decrease the risk of progressing to diabetes. Will recheck labs in the near future.   Relevant Orders: -     [name] ; Inject 12.5 mg into the skin once a week.  Dispense: 2 mL;  Refill: 2  Follow up:   Return in about 3 months (around 08/28/2023). She was informed of the importance of frequent follow up visits to maximize her success with intensive lifestyle modifications for her multiple health conditions.  Subjective:   Chief complaint: Obesity Sharon [name] is here to discuss her progress with her obesity treatment plan. She is on the Category 2 Plan and states she is following her eating plan approximately 30% of the time. She states she is lifting weights 45 minutes 2 days per week and playing tennis 40 minutes 4 days per week.  Interval History:  Sharon [name] is here for a follow up office visit. Since last OV on 04/02/2023, pt is up 2 lbs. She recently came back from [place] and started pilates last week. Additionally, she is increasing protein but still struggling to hit parameters  Pharmacotherapy for weight loss: She is currently taking [name]  12.5 mg once weekly.   Review of Systems:  Pertinent positives were addressed with patient today.  Reviewed by clinician on day of visit: allergies, medications, problem list, medical history, surgical history, family history, social history, and previous encounter notes.  Weight Summary and Biometrics   Weight Lost Since Last Visit: 0lb  Weight Gained Since Last Visit: 2lb   Vitals Temp: 97.8 F (36.6 C) BP: (!) 89/57 Pulse Rate: 66 SpO2: 97 %   Anthropometric Measurements Height: 5\' 9"  (1.753 m) Weight: 191 lb (86.6 kg) BMI (Calculated): 28.19 Weight at Last Visit: 189lb Weight Lost Since Last Visit: 0lb Weight Gained Since Last Visit: 2lb Starting Weight: 228lb Total Weight Loss (lbs): 37 lb (16.8 kg) Peak Weight: 228lb   Body Composition  Body Fat %: 35 % Fat Mass (lbs): 67 lbs Muscle Mass (lbs): 118.4 lbs Total Body Water (lbs): 73.2 lbs Visceral Fat Rating : 8   Other Clinical Data Fasting: No Labs: no Today's Visit #: 42 Starting Date: 11/19/19    Objective:   PHYSICAL  EXAM: Blood pressure (!) 89/57, pulse 66, temperature 97.8 F (36.6 C), height 5\' 9"  (1.753 m), weight 191 lb (86.6 kg), last menstrual period 03/27/2013, SpO2 97%. Body mass index is 28.21 kg/m.  General: she is overweight, cooperative and in no acute distress. PSYCH: Has normal mood, affect and thought process.   HEENT: EOMI, sclerae are anicteric. Lungs: Normal breathing effort, no conversational dyspnea. Extremities: Moves * 4 Neurologic: A and O * 3, good insight  DIAGNOSTIC DATA REVIEWED: BMET    Component Value Date/Time   NA 139 02/06/2023 0823   K 4.6 02/06/2023 0823   CL 102 02/06/2023 0823   CO2 23 02/06/2023 0823   GLUCOSE 88 02/06/2023 0823   GLUCOSE 106 (H) 05/13/2019 0840   BUN 19 02/06/2023 0823   CREATININE 0.85 02/06/2023 0823   CALCIUM 9.6 02/06/2023 0823   GFRNONAA 75 02/16/2020 1252   GFRAA 86 02/16/2020 1252   Lab Results  Component Value Date   HGBA1C 5.6 02/06/2023   HGBA1C 6.1 (H) 10/30/2019   Lab Results  Component Value Date   INSULIN  19.2 02/06/2023   INSULIN  20.2 10/30/2019  Lab Results  Component Value Date   TSH 1.890 02/06/2023   CBC    Component Value Date/Time   WBC 5.4 01/10/2021 0917   WBC 5.5 05/13/2019 0840   RBC 4.69 01/10/2021 0917   RBC 4.72 05/13/2019 0840   HGB 13.4 01/10/2021 0917   HCT 39.9 01/10/2021 0917   PLT 245 01/10/2021 0917   MCV 85 01/10/2021 0917   MCH 28.6 01/10/2021 0917   MCH 28.4 05/13/2019 0840   MCHC 33.6 01/10/2021 0917   MCHC 31.8 05/13/2019 0840   RDW 13.4 01/10/2021 0917   Iron Studies No results found for: "IRON", "TIBC", "FERRITIN", "IRONPCTSAT" Lipid Panel     Component Value Date/Time   CHOL 207 (H) 02/06/2023 0823   TRIG 81 02/06/2023 0823   HDL 69 02/06/2023 0823   CHOLHDL 3.0 02/06/2023 0823   LDLCALC 124 (H) 02/06/2023 0823   Hepatic Function Panel     Component Value Date/Time   PROT 7.0 06/29/2022 0824   ALBUMIN 4.1 06/29/2022 0824   AST 15 06/29/2022 0824   ALT 12  06/29/2022 0824   ALKPHOS 71 06/29/2022 0824   BILITOT 0.2 06/29/2022 0824      Component Value Date/Time   TSH 1.890 02/06/2023 0823   Nutritional Lab Results  Component Value Date   VD25OH 39.8 02/06/2023   VD25OH 63.8 06/29/2022   VD25OH 42.4 02/01/2022    Attestations:   I, Camryn Mix, acting as a Stage manager for [employer], DO., have compiled all relevant documentation for today's office visit on behalf of Marceil Sensor, DO, while in the presence of [employer], DO.  I have reviewed the above documentation for accuracy and completeness, and I agree with the above. Rae Bugler, D.O.  The 21st Century Cures Act was signed into law in 2016 which includes the topic of electronic health records.  This provides immediate access to information in MyChart.  This includes consultation notes, operative notes, office notes, lab results and pathology reports.  If you have any questions about what you read please let us  know at your next visit so we can discuss your concerns and take corrective action if need be.  We are right here with you.

## 2023-06-02 NOTE — Progress Notes (Incomplete)
 Sharon [name], D.O.  ABFM, ABOM Specializing in Clinical Bariatric Medicine  Office located at: [address] [place], [place]  [address]   Assessment and Plan:  No orders of the defined types were placed in this encounter.  Medications Discontinued During This Encounter  Medication Reason  . Vitamin D , Ergocalciferol , (DRISDOL ) 1.25 MG (50000 UNIT) CAPS capsule Reorder  . tirzepatide  ([name] ) 12.5 MG/0.5ML Pen Reorder    Meds ordered this encounter  Medications  . Vitamin D , Ergocalciferol , (DRISDOL ) 1.25 MG (50000 UNIT) CAPS capsule    Sig: Take 1 capsule (50,000 Units total) by mouth every 7 (seven) days.    Dispense:  12 capsule    Refill:  0    Ov for RF  . tirzepatide  ([name] ) 12.5 MG/0.5ML Pen    Sig: Inject 12.5 mg into the skin once a week.    Dispense:  2 mL    Refill:  2     FOR THE DISEASE OF OBESITY:  BMI 28.0-28.9,adult -- Current BMI 28.19 Class 1 obesity with serious comorbidity and body mass index (BMI) of 33.0 to 33.9 in adult, unspecified obesity type Assessment & Plan: Since last office visit on 04/02/2023, patient's muscle mass has decreased by 0.8 lbs. Fat mass has increased by 2.8 lbs. Total body water has increased by 3 lb.  Counseling done on how various foods will affect these numbers and how to maximize success  Total lbs lost to date: 37 lbs Total weight loss percentage to date: 16.23%    Recommended Dietary Goals Sharon [name] is currently in the action stage of change. As such, her goal is to continue weight management plan.  She has agreed to: Journal 1400-1500 cal with 100+g protein daily using Cat 2 MP as a guide   Behavioral Intervention We discussed the following today: Alternative protein options that aren't meat, increasing lean protein intake to established goals and discussed pre-packaged healthier meals such as [employer] All Natural, Whole 30 chicken meals, Longlife meal prep and Factor Meals etc  Additional resources  provided today: Handout on Common Characteristics of Successful Weight Losers,  Evidence-based interventions for health behavior change were utilized today including the discussion of self monitoring techniques, problem-solving barriers and SMART goal setting techniques.   Regarding patient's less desirable eating habits and patterns, we employed the technique of small changes.   Pt will specifically work on: n/a   Recommended Physical Activity Goals Collie has been advised to work up to 300-450 minutes of moderate intensity aerobic activity a week and strengthening exercises 2-3 times per week for cardiovascular health, weight loss maintenance and preservation of muscle mass.   She has agreed to :  Continue current level of physical activity    Pharmacotherapy We both agreed to : continue with nutritional and behavioral strategies and current medication regimen   ASSOCIATED CONDITIONS ADDRESSED TODAY:  Vitamin D  deficiency Assessment & Plan: [name] is prescribed ERGO 50,000 units once weekly, however she admits to not taking her supplement regularly. Reminded pt of benefits of optimal Vit D levels including increased energy, mood, and immune system health. Encouraged pt to take supplement as prescribed. Will refill today.   Relevant Orders: -     Vitamin D  (Ergocalciferol ); Take 1 capsule (50,000 Units total) by mouth every 7 (seven) days.  Dispense: 12 capsule; Refill: 0   Prediabetes Assessment & Plan:    Sometimes doesn't feel medication is working Discussed potentially increasing dose Pt prefers to stay at current dose for now  Follow up:   No follow-ups on file.*** She was informed of the importance of frequent follow up visits to maximize her success with intensive lifestyle modifications for her multiple health conditions.  Subjective:   Chief complaint: Obesity Sharon [name] is here to discuss her progress with her obesity treatment plan. She is on the Category 2 Plan and  states she is following her eating plan approximately 30% of the time. She states she is lifting weights 45 minutes 2 days per week and playing tennis 40 minutes 4 days per week.  Interval History:  Sharon [name] is here for a follow up office visit. Since last OV on ,  ***   Just came back from [place] Started pilates last week Increasing protein but still struggling to hit parameters   Pharmacotherapy for weight loss: She is currently taking [name]  12.5 mg once weekly.   Review of Systems:  Pertinent positives were addressed with patient today.  Reviewed by clinician on day of visit: allergies, medications, problem list, medical history, surgical history, family history, social history, and previous encounter notes.  Weight Summary and Biometrics   Weight Lost Since Last Visit: 0lb  Weight Gained Since Last Visit: 2lb  ***  Vitals Temp: 97.8 F (36.6 C) BP: (!) 89/57 Pulse Rate: 66 SpO2: 97 %   Anthropometric Measurements Height: 5\' 9"  (1.753 m) Weight: 191 lb (86.6 kg) BMI (Calculated): 28.19 Weight at Last Visit: 189lb Weight Lost Since Last Visit: 0lb Weight Gained Since Last Visit: 2lb Starting Weight: 228lb Total Weight Loss (lbs): 37 lb (16.8 kg) Peak Weight: 228lb   Body Composition  Body Fat %: 35 % Fat Mass (lbs): 67 lbs Muscle Mass (lbs): 118.4 lbs Total Body Water (lbs): 73.2 lbs Visceral Fat Rating : 8   Other Clinical Data Fasting: No Labs: no Today's Visit #: 42 Starting Date: 11/19/19    Objective:   PHYSICAL EXAM: Blood pressure (!) 89/57, pulse 66, temperature 97.8 F (36.6 C), height 5\' 9"  (1.753 m), weight 191 lb (86.6 kg), last menstrual period 03/27/2013, SpO2 97%. Body mass index is 28.21 kg/m.  General: she is overweight, cooperative and in no acute distress. PSYCH: Has normal mood, affect and thought process.   HEENT: EOMI, sclerae are anicteric. Lungs: Normal breathing effort, no conversational  dyspnea. Extremities: Moves * 4 Neurologic: A and O * 3, good insight  DIAGNOSTIC DATA REVIEWED: BMET    Component Value Date/Time   NA 139 02/06/2023 0823   K 4.6 02/06/2023 0823   CL 102 02/06/2023 0823   CO2 23 02/06/2023 0823   GLUCOSE 88 02/06/2023 0823   GLUCOSE 106 (H) 05/13/2019 0840   BUN 19 02/06/2023 0823   CREATININE 0.85 02/06/2023 0823   CALCIUM 9.6 02/06/2023 0823   GFRNONAA 75 02/16/2020 1252   GFRAA 86 02/16/2020 1252   Lab Results  Component Value Date   HGBA1C 5.6 02/06/2023   HGBA1C 6.1 (H) 10/30/2019   Lab Results  Component Value Date   INSULIN  19.2 02/06/2023   INSULIN  20.2 10/30/2019   Lab Results  Component Value Date   TSH 1.890 02/06/2023   CBC    Component Value Date/Time   WBC 5.4 01/10/2021 0917   WBC 5.5 05/13/2019 0840   RBC 4.69 01/10/2021 0917   RBC 4.72 05/13/2019 0840   HGB 13.4 01/10/2021 0917   HCT 39.9 01/10/2021 0917   PLT 245 01/10/2021 0917   MCV 85 01/10/2021 0917   MCH 28.6 01/10/2021 0917   MCH  28.4 05/13/2019 0840   MCHC 33.6 01/10/2021 0917   MCHC 31.8 05/13/2019 0840   RDW 13.4 01/10/2021 0917   Iron Studies No results found for: "IRON", "TIBC", "FERRITIN", "IRONPCTSAT" Lipid Panel     Component Value Date/Time   CHOL 207 (H) 02/06/2023 0823   TRIG 81 02/06/2023 0823   HDL 69 02/06/2023 0823   CHOLHDL 3.0 02/06/2023 0823   LDLCALC 124 (H) 02/06/2023 0823   Hepatic Function Panel     Component Value Date/Time   PROT 7.0 06/29/2022 0824   ALBUMIN 4.1 06/29/2022 0824   AST 15 06/29/2022 0824   ALT 12 06/29/2022 0824   ALKPHOS 71 06/29/2022 0824   BILITOT 0.2 06/29/2022 0824      Component Value Date/Time   TSH 1.890 02/06/2023 0823   Nutritional Lab Results  Component Value Date   VD25OH 39.8 02/06/2023   VD25OH 63.8 06/29/2022   VD25OH 42.4 02/01/2022    Attestations:   I, ***, acting as a Stage manager for Marceil Sensor, DO., have compiled all relevant documentation for today's  office visit on behalf of Marceil Sensor, DO, while in the presence of [employer], DO.  Reviewed by clinician on day of visit: allergies, medications, problem list, medical history, surgical history, family history, social history, and previous encounter notes pertinent to patient's obesity diagnosis. I have spent 40 *** minutes in the care of the patient today including: preparing to see patient (e.g. review and interpretation of tests, old notes ), obtaining and/or reviewing separately obtained history, performing a medically appropriate examination or evaluation, counseling and educating the patient, ordering medications, test or procedures, documenting clinical information in the electronic or other health care record, and independently interpreting results and communicating results to the patient, family, or caregiver   I have reviewed the above documentation for accuracy and completeness, and I agree with the above. Sharon [name], D.O.  The 21st Century Cures Act was signed into law in 2016 which includes the topic of electronic health records.  This provides immediate access to information in MyChart.  This includes consultation notes, operative notes, office notes, lab results and pathology reports.  If you have any questions about what you read please let us  know at your next visit so we can discuss your concerns and take corrective action if need be.  We are right here with you.

## 2023-06-20 ENCOUNTER — Encounter (INDEPENDENT_AMBULATORY_CARE_PROVIDER_SITE_OTHER): Payer: Self-pay | Admitting: Family Medicine

## 2023-06-20 DIAGNOSIS — R112 Nausea with vomiting, unspecified: Secondary | ICD-10-CM

## 2023-06-21 MED ORDER — ONDANSETRON 4 MG PO TBDP: 4.0000 mg | ORAL_TABLET | Freq: Three times a day (TID) | ORAL | 0 refills | Status: DC | PRN

## 2023-06-21 NOTE — Telephone Encounter (Signed)
 I called patient yesterday after hours to assess symptoms.  She reports experiencing nausea and vomiting since Friday following injection.  Denies abdominal pain, diarrhea or fever.  She has been on [name]  12.5 mg once a week for several months so this is not a recent medication adjustment.  She denies eating foods incompatible with GLP-1 therapy.  She is having a difficult time with oral intake and maintaining hydration but denies symptoms of dehydration.  She is currently in [place].  We advised patient on adequate hydration also on eating small meals of bland foods.  She may drink ginger ale for nausea.  She is also to sip water and avoid drinking fluids and gulp.  She will be prescribed ondansetron  4 mg every 8 hours as needed.  This was sent to [facility] in [place] this as she will transfer this to [place].  Patient advised to delay next injection until symptoms have improved.  She may need dose reduction if symptoms are not better .  Patient also advised to go to the emergency room if she has a difficult time maintaining nutrition or hydration because of her nausea and vomiting.  Total time spent on care coordination 20 minutes

## 2023-08-28 ENCOUNTER — Ambulatory Visit (INDEPENDENT_AMBULATORY_CARE_PROVIDER_SITE_OTHER): Admitting: Family Medicine

## 2023-09-05 ENCOUNTER — Encounter (INDEPENDENT_AMBULATORY_CARE_PROVIDER_SITE_OTHER): Payer: Self-pay | Admitting: Family Medicine

## 2023-09-05 ENCOUNTER — Ambulatory Visit (INDEPENDENT_AMBULATORY_CARE_PROVIDER_SITE_OTHER): Admitting: Family Medicine

## 2023-09-05 VITALS — BP 97/60 | HR 60 | Temp 98.0°F | Ht 69.0 in | Wt 190.0 lb

## 2023-09-05 DIAGNOSIS — R7303 Prediabetes: Secondary | ICD-10-CM

## 2023-09-05 DIAGNOSIS — Z6829 Body mass index (BMI) 29.0-29.9, adult: Secondary | ICD-10-CM

## 2023-09-05 DIAGNOSIS — E7849 Other hyperlipidemia: Secondary | ICD-10-CM

## 2023-09-05 DIAGNOSIS — E559 Vitamin D deficiency, unspecified: Secondary | ICD-10-CM

## 2023-09-05 DIAGNOSIS — Z6828 Body mass index (BMI) 28.0-28.9, adult: Secondary | ICD-10-CM

## 2023-09-05 DIAGNOSIS — E669 Obesity, unspecified: Secondary | ICD-10-CM | POA: Diagnosis not present

## 2023-09-05 MED ORDER — VITAMIN D (ERGOCALCIFEROL) 1.25 MG (50000 UNIT) PO CAPS: 50000.0000 [IU] | ORAL_CAPSULE | ORAL | 0 refills | Status: DC

## 2023-09-05 MED ORDER — ZEPBOUND 12.5 MG/0.5ML ~~LOC~~ SOAJ: 12.5000 mg | SUBCUTANEOUS | 2 refills | Status: DC

## 2023-09-05 NOTE — Progress Notes (Signed)
 Sharon [name], D.O.  ABFM, ABOM Specializing in Clinical Bariatric Medicine  Office located at: [address] [place], [place]  [address]     Assessment and Plan:   Orders Placed This Encounter  Procedures   VITAMIN D  25 Hydroxy (Vit-D Deficiency, Fractures)   Hemoglobin A1c   Insulin , random   Lipid panel   Comprehensive metabolic panel with GFR   Medications Discontinued During This Encounter  Medication Reason   Vitamin D , Ergocalciferol , (DRISDOL ) 1.25 MG (50000 UNIT) CAPS capsule Reorder   tirzepatide  ([name] ) 12.5 MG/0.5ML Pen Reorder    Meds ordered this encounter  Medications   Vitamin D , Ergocalciferol , (DRISDOL ) 1.25 MG (50000 UNIT) CAPS capsule    Sig: Take 1 capsule (50,000 Units total) by mouth every 7 (seven) days.    Dispense:  12 capsule    Refill:  0    Ov for RF   tirzepatide  ([name] ) 12.5 MG/0.5ML Pen    Sig: Inject 12.5 mg into the skin once a week.    Dispense:  2 mL    Refill:  2     Labs obtained today (CMP w GFR, A1c, insulin , lipid panel, vit D) will be reviewed at next OV.    FOR THE DISEASE OF OBESITY:  Obesity, Beginning BMI 33.67 BMI 29.0-29.9,adult - current BMI 28.05 Assessment & Plan: Since last office visit on 05/28/23 patient's muscle mass has decreased by 0.4 lbs. Fat mass has decreased by 0.4 lbs. Total body water has decreased by 2.6 lbs.. Counseling done on how various foods will affect these numbers and how to maximize success  Total lbs lost to date: 38 lbs Total weight loss percentage to date: -16.67 %   Recommended Dietary Goals Sharon [name] is currently in the action stage of change. As such, her goal is to continue weight management plan.  She has agreed to: continue current plan   Behavioral Intervention We extensively discussed the following today: increasing lean protein intake to established goals, decreasing simple carbohydrates , increasing vegetables, focusing on food with a 10:1 ratio of calories: grams  of protein, and using GPT or another AI platform for recipe ideas- searching low calorie, low carb, high protein chicken recipes etc., balancing plate concepts, reviewed the role of protein as it relates to weight loss and improving metabolism.  Additional resources provided today: Handout on complex carbohydrates and lean sources of protein and Handout on Common Characteristics of Successful Weight Losers and Maintainers  and Handout on Non-starchy veggies  Evidence-based interventions for health behavior change were utilized today including the discussion of self monitoring techniques, problem-solving barriers and SMART goal setting techniques.   Regarding patient's less desirable eating habits and patterns, we employed the technique of small changes.   Pt will specifically work on: journaling at least 2 days/week     Recommended Physical Activity Goals Sharon [name] has been advised to work up to 300-450 minutes of moderate intensity aerobic activity a week and strengthening exercises 2-3 times per week for cardiovascular health, weight loss maintenance and preservation of muscle mass.   She has agreed to: Continue current level of physical activity  and Increase physical activity in their day and reduce sedentary time (increase NEAT).   Pharmacotherapy Pt is on [name]  12.5 mg once weekly. Reports persistent nausea, vomiting, and decreased appetite for 1 week after taking [name] , around late [facility] June. Given she tolerated this dose and had no other sx, she believes the specific vial may have had issues. She skipped  2 doses before resuming and had no adverse side effects afterwards. Dosage was not changed at the prior OV.  At the time, pt was focusing on drinking Pedialyte. Hunger/cravings controlled overall.   Given she has not had any GI issues since resuming her [name] , we mutually agreed for [facility] to continue [name]  at her current dose. Will refill [name]  today. Risks/benefits  reviewed. f pt desires, she may contact [employer] to report the lot number and have the vials distributed reviewed. Encouraged pt to increase her protein, decrease simple carbs, and limit consumption of processed foods. Reviewed potential GI side effects when eating off-plan.    ASSOCIATED CONDITIONS ADDRESSED TODAY:  Prediabetes Assessment & Plan: Lab Results  Component Value Date   HGBA1C 5.6 02/06/2023   HGBA1C 5.7 (H) 06/29/2022   HGBA1C 5.4 02/01/2022   INSULIN  19.2 02/06/2023   INSULIN  25.1 (H) 06/29/2022   INSULIN  18.2 02/01/2022    Diet/lifestyle intervention approach. Not currently on pharmacologic treatment. Reviewed last obtained labs: A1c and insulin  improved from prior values.   Continue working on healthy diet and exercise to prevent progression to diabetes and promote weight loss. Focus on increasing protein and decreasing simple carb intake. Rechecking CMP w GFR, A1c, and insulin  today. Will review results at next OV.    Vitamin D  deficiency Assessment & Plan: Lab Results  Component Value Date   VD25OH 39.8 02/06/2023   VD25OH 63.8 06/29/2022   VD25OH 42.4 02/01/2022   Reviewed last obtained labs: VD below goal. Currently on ERGO 50K units once weekly. Good compliance/tolerance. No acute concerns.   Continue with supplementation at current dose. Discussed vit D may have improved due to increase sun exposure during the summer and is expected to decrease in the winter. Will refill ERGO today, no dose changes.  Rechecking vit D today. Will review results at next OV.     Other hyperlipidemia Assessment & Plan: Lab Results  Component Value Date   CHOL 207 (H) 02/06/2023   HDL 69 02/06/2023   LDLCALC 124 (H) 02/06/2023   TRIG 81 02/06/2023   CHOLHDL 3.0 02/06/2023   Reviewed last obtained labs: her LDL was above goal. Currently on Rosuvastatin 20 mg once weekly. Good compliance an tolerance. No adverse side effects reported.   Continue statin therapy as  prescribed. Work on Nurse, mental health fats and limit consumption of fatty meats. Increase lean protein intake. Reviewed the benefits of exercise; continue current exercise routine. Rechecking lipid panel today. Will review results at next OV.    Follow up:   Return in about 12 weeks (around 11/28/2023) for f/u on 11/28/2023 at 8:00 AM. She was informed of the importance of frequent follow up visits to maximize her success with intensive lifestyle modifications for her multiple health conditions.  Subjective:   Chief complaint: Obesity Sharon [name] is here to discuss her progress with her obesity treatment plan. She is keeping a food journal and adhering to recommended goals of 1400-1500 calories and 100+ g of protein daily using Cat 2 MP as a guide and states she is following her eating plan approximately 40% of the time. She states she is walking, playing tennis, and doing strength training 45-90 minutes 5-6 days per week.    Interval History:  Sharon [name] is here for a follow up office visit. Since last OV on 05/28/23, she is down 1 lb. Pt meeting her protein intake goal daily, around 120 g of protein. She has not been sleeping the 7-9 hours per night,  stating she wakes up around 2-3:30 AM. Not using the bathroom; I just lie awake. She attributes this sleep interruptions to menopause. Recently went on an expedition cruise to [place] and [place], stating she enjoyed herself well.    Pharmacotherapy that aid with weight loss: She is currently taking [name]  12.5 mg once weekly.    Review of Systems:  Pertinent positives were addressed with patient today.  Reviewed by clinician on day of visit: allergies, medications, problem list, medical history, surgical history, family history, social history, and previous encounter notes.  Weight Summary and Biometrics   Weight Lost Since Last Visit: 1lb  Weight Gained Since Last Visit: 0    Vitals Temp: 98 F (36.7 C) BP:  97/60 Pulse Rate: 60 SpO2: 93 %   Anthropometric Measurements Height: 5' 9 (1.753 m) Weight: 190 lb (86.2 kg) BMI (Calculated): 28.05 Weight at Last Visit: 191lb Weight Lost Since Last Visit: 1lb Weight Gained Since Last Visit: 0 Starting Weight: 228lb Total Weight Loss (lbs): 38 lb (17.2 kg)   Body Composition  Body Fat %: 34.9 % Fat Mass (lbs): 66.6 lbs Muscle Mass (lbs): 118 lbs Total Body Water (lbs): 70.6 lbs Visceral Fat Rating : 8   Other Clinical Data Fasting: yes Labs: no Today's Visit #: 43 Starting Date: 10/30/19    Objective:   PHYSICAL EXAM: Blood pressure 97/60, pulse 60, temperature 98 F (36.7 C), height 5' 9 (1.753 m), weight 190 lb (86.2 kg), last menstrual period 03/27/2013, SpO2 93%. Body mass index is 28.06 kg/m.  General: she is overweight, cooperative and in no acute distress. PSYCH: Has normal mood, affect and thought process.   HEENT: EOMI, sclerae are anicteric. Lungs: Normal breathing effort, no conversational dyspnea. Extremities: Moves * 4 Neurologic: A and O * 3, good insight  DIAGNOSTIC DATA REVIEWED: BMET    Component Value Date/Time   NA 139 02/06/2023 0823   K 4.6 02/06/2023 0823   CL 102 02/06/2023 0823   CO2 23 02/06/2023 0823   GLUCOSE 88 02/06/2023 0823   GLUCOSE 106 (H) 05/13/2019 0840   BUN 19 02/06/2023 0823   CREATININE 0.85 02/06/2023 0823   CALCIUM 9.6 02/06/2023 0823   GFRNONAA 75 02/16/2020 1252   GFRAA 86 02/16/2020 1252   Lab Results  Component Value Date   HGBA1C 5.6 02/06/2023   HGBA1C 6.1 (H) 10/30/2019   Lab Results  Component Value Date   INSULIN  19.2 02/06/2023   INSULIN  20.2 10/30/2019   Lab Results  Component Value Date   TSH 1.890 02/06/2023   CBC    Component Value Date/Time   WBC 5.4 01/10/2021 0917   WBC 5.5 05/13/2019 0840   RBC 4.69 01/10/2021 0917   RBC 4.72 05/13/2019 0840   HGB 13.4 01/10/2021 0917   HCT 39.9 01/10/2021 0917   PLT 245 01/10/2021 0917   MCV 85  01/10/2021 0917   MCH 28.6 01/10/2021 0917   MCH 28.4 05/13/2019 0840   MCHC 33.6 01/10/2021 0917   MCHC 31.8 05/13/2019 0840   RDW 13.4 01/10/2021 0917   Iron Studies No results found for: IRON, TIBC, FERRITIN, IRONPCTSAT Lipid Panel     Component Value Date/Time   CHOL 207 (H) 02/06/2023 0823   TRIG 81 02/06/2023 0823   HDL 69 02/06/2023 0823   CHOLHDL 3.0 02/06/2023 0823   LDLCALC 124 (H) 02/06/2023 0823   Hepatic Function Panel     Component Value Date/Time   PROT 7.0 06/29/2022 0824   ALBUMIN 4.1 06/29/2022  0824   AST 15 06/29/2022 0824   ALT 12 06/29/2022 0824   ALKPHOS 71 06/29/2022 0824   BILITOT 0.2 06/29/2022 0824      Component Value Date/Time   TSH 1.890 02/06/2023 0823   Nutritional Lab Results  Component Value Date   VD25OH 39.8 02/06/2023   VD25OH 63.8 06/29/2022   VD25OH 42.4 02/01/2022    Attestations:   I, Vernell Forest, acting as a medical scribe for Sharon Jenkins, DO., have compiled all relevant documentation for today's office visit on behalf of Sharon Jenkins, DO, while in the presence of [employer], DO.  Reviewed by clinician on day of visit: allergies, medications, problem list, medical history, surgical history, family history, social history, and previous encounter notes pertinent to patient's obesity diagnosis.  I have spent 40 minutes in the care of the patient today including 26 minutes face-to-face assessing and reviewing listed medical problems above as outlined in office visit note and providing nutritional and behavioral counseling as outlined in obesity care plan.   I have reviewed the above documentation for accuracy and completeness, and I agree with the above. Sharon Sharon [name], D.O.  The 21st Century Cures Act was signed into law in 2016 which includes the topic of electronic health records.  This provides immediate access to information in MyChart.  This includes consultation notes, operative notes, office notes,  lab results and pathology reports.  If you have any questions about what you read please let us  know at your next visit so we can discuss your concerns and take corrective action if need be.  We are right here with you.

## 2023-09-06 LAB — COMPREHENSIVE METABOLIC PANEL WITH GFR
ALT: 16 IU/L (ref 0–32)
AST: 33 IU/L (ref 0–40)
Albumin: 4.4 g/dL (ref 3.8–4.9)
Alkaline Phosphatase: 69 IU/L (ref 44–121)
BUN/Creatinine Ratio: 23 (ref 9–23)
BUN: 20 mg/dL (ref 6–24)
Bilirubin Total: 0.3 mg/dL (ref 0.0–1.2)
CO2: 23 mmol/L (ref 20–29)
Calcium: 9.8 mg/dL (ref 8.7–10.2)
Chloride: 102 mmol/L (ref 96–106)
Creatinine, Ser: 0.86 mg/dL (ref 0.57–1.00)
Globulin, Total: 3 g/dL (ref 1.5–4.5)
Glucose: 79 mg/dL (ref 70–99)
Potassium: 5.2 mmol/L (ref 3.5–5.2)
Sodium: 139 mmol/L (ref 134–144)
Total Protein: 7.4 g/dL (ref 6.0–8.5)
eGFR: 79 mL/min/1.73 (ref >59)

## 2023-09-06 LAB — LIPID PANEL
Chol/HDL Ratio: 3 ratio (ref 0.0–4.4)
Cholesterol, Total: 240 mg/dL — ABNORMAL HIGH (ref 100–199)
HDL: 80 mg/dL (ref >39)
LDL Chol Calc (NIH): 148 mg/dL — ABNORMAL HIGH (ref 0–99)
Triglycerides: 72 mg/dL (ref 0–149)
VLDL Cholesterol Cal: 12 mg/dL (ref 5–40)

## 2023-09-06 LAB — INSULIN, RANDOM: INSULIN: 20.8 u[IU]/mL (ref 2.6–24.9)

## 2023-09-06 LAB — HEMOGLOBIN A1C
Est. average glucose Bld gHb Est-mCnc: 117 mg/dL
Hgb A1c MFr Bld: 5.7 % — ABNORMAL HIGH (ref 4.8–5.6)

## 2023-09-06 LAB — VITAMIN D 25 HYDROXY (VIT D DEFICIENCY, FRACTURES): Vit D, 25-Hydroxy: 36.9 ng/mL (ref 30.0–100.0)

## 2023-09-10 ENCOUNTER — Telehealth (INDEPENDENT_AMBULATORY_CARE_PROVIDER_SITE_OTHER): Payer: Self-pay

## 2023-09-10 NOTE — Telephone Encounter (Signed)
 PA for [name] 12.5 has been submitted, awaiting PA questions.

## 2023-09-27 ENCOUNTER — Telehealth (INDEPENDENT_AMBULATORY_CARE_PROVIDER_SITE_OTHER): Payer: Self-pay

## 2023-09-27 NOTE — Telephone Encounter (Signed)
 PA for [name]  12.5 has been submitted, awaiting PA questions.    PA questions for [name]  12.5  have been answered and all documentation has been included. Waiting on a determination.

## 2023-09-27 NOTE — Telephone Encounter (Signed)
 PA for [name] 12.5 has been submitted, awaiting PA questions.

## 2023-09-27 NOTE — Telephone Encounter (Signed)
 PA questions for [name] 12.5  have been answered and all documentation has been included. Waiting on a determination.

## 2023-09-27 NOTE — Telephone Encounter (Signed)
 Questions expired. Resubmitting

## 2023-10-01 NOTE — Telephone Encounter (Signed)
 PA for [name]  12.5 has been approved. PA is now complete.     Message from Plan Approved. . Authorization Expiration Date: September 26, 2024.

## 2023-10-01 NOTE — Telephone Encounter (Signed)
 PA for Zepboumd 12.5 has been approved. PA is now complete.     Message from Plan Approved. . Authorization Expiration Date: September 26, 2024.

## 2023-11-26 DIAGNOSIS — Z471 Aftercare following joint replacement surgery: Secondary | ICD-10-CM | POA: Diagnosis not present

## 2023-11-26 DIAGNOSIS — Z96641 Presence of right artificial hip joint: Secondary | ICD-10-CM | POA: Diagnosis not present

## 2023-11-28 ENCOUNTER — Ambulatory Visit (INDEPENDENT_AMBULATORY_CARE_PROVIDER_SITE_OTHER): Payer: Self-pay | Admitting: Family Medicine

## 2023-12-31 ENCOUNTER — Ambulatory Visit (INDEPENDENT_AMBULATORY_CARE_PROVIDER_SITE_OTHER): Admitting: Family Medicine

## 2024-01-04 ENCOUNTER — Other Ambulatory Visit: Payer: Self-pay | Admitting: Obstetrics and Gynecology

## 2024-01-04 DIAGNOSIS — Z1231 Encounter for screening mammogram for malignant neoplasm of breast: Secondary | ICD-10-CM

## 2024-01-28 ENCOUNTER — Ambulatory Visit (INDEPENDENT_AMBULATORY_CARE_PROVIDER_SITE_OTHER): Admitting: Family Medicine

## 2024-01-28 ENCOUNTER — Encounter (INDEPENDENT_AMBULATORY_CARE_PROVIDER_SITE_OTHER): Payer: Self-pay | Admitting: Family Medicine

## 2024-01-28 VITALS — BP 98/65 | HR 62 | Temp 97.8°F | Ht 69.0 in | Wt 190.0 lb

## 2024-01-28 DIAGNOSIS — Z6829 Body mass index (BMI) 29.0-29.9, adult: Secondary | ICD-10-CM

## 2024-01-28 DIAGNOSIS — E559 Vitamin D deficiency, unspecified: Secondary | ICD-10-CM | POA: Diagnosis not present

## 2024-01-28 DIAGNOSIS — R7303 Prediabetes: Secondary | ICD-10-CM | POA: Diagnosis not present

## 2024-01-28 DIAGNOSIS — Z6828 Body mass index (BMI) 28.0-28.9, adult: Secondary | ICD-10-CM

## 2024-01-28 DIAGNOSIS — E669 Obesity, unspecified: Secondary | ICD-10-CM | POA: Diagnosis not present

## 2024-01-28 DIAGNOSIS — R112 Nausea with vomiting, unspecified: Secondary | ICD-10-CM | POA: Diagnosis not present

## 2024-01-28 MED ORDER — ZEPBOUND 12.5 MG/0.5ML ~~LOC~~ SOAJ: 12.5000 mg | SUBCUTANEOUS | 2 refills | Status: AC

## 2024-01-28 MED ORDER — VITAMIN D (ERGOCALCIFEROL) 1.25 MG (50000 UNIT) PO CAPS: 50000.0000 [IU] | ORAL_CAPSULE | ORAL | 0 refills | Status: AC

## 2024-01-28 MED ORDER — ONDANSETRON 4 MG PO TBDP: 4.0000 mg | ORAL_TABLET | Freq: Three times a day (TID) | ORAL | 0 refills | Status: AC | PRN

## 2024-01-28 NOTE — Progress Notes (Signed)
 "  Sharon [name], D.O.  ABFM, ABOM Specializing in Clinical Bariatric Medicine  Office located at: [address] [place], [place]  [address]      A) FOR THE CHRONIC DISEASE OF OBESITY: Obesity, Beginning BMI 33.67 BMI 29.0-29.9,adult - current BMI 28.05  Chief complaint: Obesity Sharon [name] is here to discuss her progress with her obesity treatment plan.   History of present illness / Interval history:  Sharon [name] is here today for her follow-up office visit.  Since last OV on 09/05/2023, pt weight is unchanged.    09/05/23 08:00 01/28/24 07:00   Body Fat % 34.9 % 34.3 %  Muscle Mass (lbs) 118 lbs 118.8 lbs  Fat Mass (lbs) 66.6 lbs 65.4 lbs  Total Body Water (lbs) 70.6 lbs 69.2 lbs  Visceral Fat Rating  8 8  Counseling done on how various foods will affect these numbers and how to maximize success   Total lbs lost to date: -38 lbs Total Fat Mass in lbs lost to date: -12.8 Total weight loss percentage to date: -16.67 %   Nutrition Therapy She is he is keeping a food journal and adhering to recommended goals of 1400-1500 calories and 100+ g of protein and states she is following her eating plan approximately 30 % of the time.   - Tracking Calories/Macros: no - ***  - Eating More Whole Foods: yes  - Adequate Protein Intake: no - ***  - Adequate Water Intake: no - ***  - Skipping Meals: no - ***  - Sleeping 7-9 Hours/ Night: yes   Kessie is currently in the action stage of change. As such, her goal is to continue weight management plan.  She has agreed to: continue current plan   Physical Activity Pt is playing tennis, strength training, and line dancing 90+  minutes 5 days per week   [name] has been advised to work up to 300-450 minutes of moderate intensity aerobic activity a week and strengthening exercises 2-3 times per week for cardiovascular health, weight loss maintenance and preservation of muscle mass.  She has agreed to : Continue current level of  physical activity  and Combine aerobic and strengthening exercises for efficiency and improved cardiometabolic health.   Behavioral Modifications Evidence-based interventions for health behavior change were utilized today including the discussion of  1) self monitoring techniques:  journaling 2) problem-solving barriers:  n/a 3) self care:  exercise 4) SMART goals for next OV:  maintain muscle and shed fat Regarding patient's less desirable eating habits and patterns, we employed the technique of small changes.   We discussed the following today: increasing lean protein intake to established goals, continue to work on implementation of reduced calorie nutritional plan, and continue to practice mindfulness when eating Additional resources provided today: Handout on balanced plate concepts.     Medical Interventions/ Pharmacotherapy Previous Bariatric surgery: none Pharmacotherapy for weight loss: She is currently prescribed [name]12.5 mg for medical weight loss.    Discontinued use of [name]  for about 3 weeks because she reports she wanted to wean off of it. For about 2 weeks felt great, but then noticed weight gain. Restarted medication to help aid weight loss, and with the first shot of medication experienced nausea. Discussed different options to wean off the medication slowly. If pt desires to eventually discontinue medication, recommended dosing once every 10 days. Cont medication (refill today).  We discussed various medication options to help Sabrinia with her weight loss efforts and we both agreed  to : Continue with current nutritional and behavioral strategies   B) OBESITY RELATED CONDITIONS ADDRESSED TODAY:   Nausea and vomiting, unspecified vomiting type Assessment & Plan Discontinued use of [name]  for about 3 weeks because she reports she wanted to wean off of it. She reported feeling well for the first 2 weeks, but then noticed weight gain. Medication was restarted to help  aid weight loss, and with the first dose she experienced nausea. Prescribed Zofran  4 mg every 8 hours (last prescription in May 2025) to help alleviate nausea symptoms. Focus on whole foods and lean protein to help reduce symptoms of nausea. Will continue to monitor.    Prediabetes Assessment & Plan Lab Results  Component Value Date   HGBA1C 5.7 (H) 09/05/2023   HGBA1C 5.6 02/06/2023   HGBA1C 5.7 (H) 06/29/2022   INSULIN  20.8 09/05/2023   INSULIN  19.2 02/06/2023   INSULIN  25.1 (H) 06/29/2022  Managed by dietary and lifestyle interventions. A1c has increased from 5.6 on 02/06/2023 to 5.7 on 09/05/2023. Insulin  has worsened from 19.2 to 20.8. No acute concerns today. Cont decreasing simple carbs/sugars and increasing lean proteins. Encouraged consistent adherence to exercise.     Vitamin D  deficiency Assessment & Plan Lab Results  Component Value Date   VD25OH 36.9 09/05/2023   VD25OH 39.8 02/06/2023   VD25OH 63.8 06/29/2022  Prescribed Ergo 50K units once weekly with reported poor compliance. Vit D levels are below at 36.9: ideal between 50-70. Reviewed benefits of Vit D. Encouraged adherence to medication. (Refill today). Will recheck levels as necessary.     Medications Discontinued During This Encounter  Medication Reason   ondansetron  (ZOFRAN -ODT) 4 MG disintegrating tablet Reorder   Vitamin D , Ergocalciferol , (DRISDOL ) 1.25 MG (50000 UNIT) CAPS capsule Reorder   tirzepatide  ([name] ) 12.5 MG/0.5ML Pen Reorder     Meds ordered this encounter  Medications   ondansetron  (ZOFRAN -ODT) 4 MG disintegrating tablet    Sig: Take 1 tablet (4 mg total) by mouth every 8 (eight) hours as needed for nausea or vomiting.    Dispense:  30 tablet    Refill:  0   tirzepatide  ([name] ) 12.5 MG/0.5ML Pen    Sig: Inject 12.5 mg into the skin once a week.    Dispense:  2 mL    Refill:  2   Vitamin D , Ergocalciferol , (DRISDOL ) 1.25 MG (50000 UNIT) CAPS capsule    Sig: Take 1 capsule (50,000  Units total) by mouth every 7 (seven) days.    Dispense:  12 capsule    Refill:  0    Ov for RF      Follow up:   Return 04/30/2024 8:00 AM.  She was informed of the importance of frequent follow up visits to maximize her success with intensive lifestyle modifications for her multiple health conditions.   Weight Summary and Biometrics   Weight Lost Since Last Visit: 0lb  Weight Gained Since Last Visit: 0lb    Vitals Temp: 97.8 F (36.6 C) BP: 98/65 Pulse Rate: 62 SpO2: 98 %   Anthropometric Measurements Height: 5' 9 (1.753 m) Weight: 190 lb (86.2 kg) BMI (Calculated): 28.05 Weight at Last Visit: 190lb Weight Lost Since Last Visit: 0lb Weight Gained Since Last Visit: 0lb Starting Weight: 228lb Total Weight Loss (lbs): 38 lb (17.2 kg)   Body Composition  Body Fat %: 34.3 % Fat Mass (lbs): 65.4 lbs Muscle Mass (lbs): 118.8 lbs Total Body Water (lbs): 69.2 lbs Visceral Fat Rating : 8   Other Clinical Data  Fasting: yes Labs: no Today's Visit #: 44 Starting Date: 10/30/19    Objective:   PHYSICAL EXAM: Blood pressure 98/65, pulse 62, temperature 97.8 F (36.6 C), height 5' 9 (1.753 m), weight 190 lb (86.2 kg), last menstrual period 03/27/2013, SpO2 98%. Body mass index is 28.06 kg/m.  General: she is overweight, cooperative and in no acute distress. PSYCH: Has normal mood, affect and thought process.   HEENT: EOMI, sclerae are anicteric. Lungs: Normal breathing effort, no conversational dyspnea. Extremities: Moves * 4 Neurologic: A and O * 3, good insight  DIAGNOSTIC DATA REVIEWED: BMET    Component Value Date/Time   NA 139 09/05/2023 1004   K 5.2 09/05/2023 1004   CL 102 09/05/2023 1004   CO2 23 09/05/2023 1004   GLUCOSE 79 09/05/2023 1004   GLUCOSE 106 (H) 05/13/2019 0840   BUN 20 09/05/2023 1004   CREATININE 0.86 09/05/2023 1004   CALCIUM 9.8 09/05/2023 1004   GFRNONAA 75 02/16/2020 1252   GFRAA 86 02/16/2020 1252   Lab Results   Component Value Date   HGBA1C 5.7 (H) 09/05/2023   HGBA1C 6.1 (H) 10/30/2019   Lab Results  Component Value Date   INSULIN  20.8 09/05/2023   INSULIN  20.2 10/30/2019   Lab Results  Component Value Date   TSH 1.890 02/06/2023   CBC    Component Value Date/Time   WBC 5.4 01/10/2021 0917   WBC 5.5 05/13/2019 0840   RBC 4.69 01/10/2021 0917   RBC 4.72 05/13/2019 0840   HGB 13.4 01/10/2021 0917   HCT 39.9 01/10/2021 0917   PLT 245 01/10/2021 0917   MCV 85 01/10/2021 0917   MCH 28.6 01/10/2021 0917   MCH 28.4 05/13/2019 0840   MCHC 33.6 01/10/2021 0917   MCHC 31.8 05/13/2019 0840   RDW 13.4 01/10/2021 0917   Iron Studies No results found for: IRON, TIBC, FERRITIN, IRONPCTSAT Lipid Panel     Component Value Date/Time   CHOL 240 (H) 09/05/2023 1004   TRIG 72 09/05/2023 1004   HDL 80 09/05/2023 1004   CHOLHDL 3.0 09/05/2023 1004   LDLCALC 148 (H) 09/05/2023 1004   Hepatic Function Panel     Component Value Date/Time   PROT 7.4 09/05/2023 1004   ALBUMIN 4.4 09/05/2023 1004   AST 33 09/05/2023 1004   ALT 16 09/05/2023 1004   ALKPHOS 69 09/05/2023 1004   BILITOT 0.3 09/05/2023 1004      Component Value Date/Time   TSH 1.890 02/06/2023 0823   Nutritional Lab Results  Component Value Date   VD25OH 36.9 09/05/2023   VD25OH 39.8 02/06/2023   VD25OH 63.8 06/29/2022    Attestations:   I, Feliciano Mingle, acting as a stage manager for [employer], DO., have compiled all relevant documentation for today's office visit on behalf of Sharon Jenkins, DO, while in the presence of [employer], DO.    I have reviewed the above documentation for accuracy and completeness, and I agree with the above. Sharon JINNY [name], D.O.  The 21st Century Cures Act was signed into law in 2016 which includes the topic of electronic health records.  This provides immediate access to information in MyChart.  This includes consultation notes, operative notes, office notes, lab  results and pathology reports.  If you have any questions about what you read please let us  know at your next visit so we can discuss your concerns and take corrective action if need be.  We are right here with you.  "

## 2024-02-14 ENCOUNTER — Ambulatory Visit

## 2024-02-14 ENCOUNTER — Ambulatory Visit
Admission: RE | Admit: 2024-02-14 | Discharge: 2024-02-14 | Disposition: A | Discharge status: Home / Self Care | Source: Ambulatory Visit | Attending: Obstetrics and Gynecology | Admitting: Obstetrics and Gynecology

## 2024-02-14 DIAGNOSIS — Z1231 Encounter for screening mammogram for malignant neoplasm of breast: Secondary | ICD-10-CM

## 2024-02-22 ENCOUNTER — Other Ambulatory Visit (HOSPITAL_BASED_OUTPATIENT_CLINIC_OR_DEPARTMENT_OTHER): Payer: Self-pay | Admitting: Family Medicine

## 2024-02-22 DIAGNOSIS — E2839 Other primary ovarian failure: Secondary | ICD-10-CM

## 2024-02-25 ENCOUNTER — Encounter: Payer: Self-pay | Admitting: Cardiology

## 2024-02-25 ENCOUNTER — Ambulatory Visit: Admitting: Cardiology

## 2024-02-25 VITALS — BP 90/66 | HR 75 | Ht 68.5 in | Wt 198.0 lb

## 2024-02-25 DIAGNOSIS — E7849 Other hyperlipidemia: Secondary | ICD-10-CM | POA: Diagnosis not present

## 2024-02-25 MED ORDER — ROSUVASTATIN CALCIUM 20 MG PO TABS: 20.0000 mg | ORAL_TABLET | Freq: Every day | ORAL | 3 refills | Status: AC

## 2024-02-25 NOTE — Progress Notes (Signed)
 "    Referring-Charles Dale Gull, MD Reason for referral-hyperlipidemia  HPI: 59 year old female for evaluation of hyperlipidemia at request of Carlin Dale Gull, MD.  Calcium  score February 2024 0.  Cholesterol panel January 2026 showed total cholesterol 228, LDL 144 and HDL 73.  LP(a) 145.  Cardiology now asked to evaluate.  Patient denies dyspnea on exertion, orthopnea, PND, pedal edema, chest pain or syncope.  Current Outpatient Medications  Medication Sig Dispense Refill   albuterol (VENTOLIN HFA) 108 (90 Base) MCG/ACT inhaler Inhale 2 puffs into the lungs every 4 (four) hours as needed.     celecoxib (CELEBREX) 200 MG capsule Take by mouth.     cyclobenzaprine (FLEXERIL) 10 MG tablet Take 10 mg by mouth at bedtime.     ibuprofen (ADVIL) 200 MG tablet Take 400 mg by mouth every 6 (six) hours as needed for moderate pain.     ondansetron  (ZOFRAN -ODT) 4 MG disintegrating tablet Take 1 tablet (4 mg total) by mouth every 8 (eight) hours as needed for nausea or vomiting. 30 tablet 0   pantoprazole  (PROTONIX ) 20 MG tablet Take 1 tablet (20 mg total) by mouth daily. 30 tablet 0   tirzepatide  ([name] ) 12.5 MG/0.5ML Pen Inject 12.5 mg into the skin once a week. 2 mL 2   Vitamin D , Ergocalciferol , (DRISDOL ) 1.25 MG (50000 UNIT) CAPS capsule Take 1 capsule (50,000 Units total) by mouth every 7 (seven) days. 12 capsule 0   rosuvastatin  (CRESTOR ) 20 MG tablet Take 20 mg by mouth once a week. (Patient not taking: Reported on 02/25/2024)     No current facility-administered medications for this visit.    Allergies[1]   Past Medical History:  Diagnosis Date   Anemia    Back pain    Exertional asthma    Female infertility    GERD (gastroesophageal reflux disease)    High cholesterol    Right leg pain    Vitamin D  deficiency     Past Surgical History:  Procedure Laterality Date   CESAREAN SECTION     LAPAROSCOPY     TOTAL HIP ARTHROPLASTY Right     Social History   Socioeconomic  History   Marital status: Married    Spouse name: Jackee   Number of children: 3   Years of education: 16   Highest education level: Not on file  Occupational History   Occupation: Homemaker  Tobacco Use   Smoking status: Never   Smokeless tobacco: Never  Substance and Sexual Activity   Alcohol use: Yes    Comment: rare, social   Drug use: No   Sexual activity: Not on file  Other Topics Concern   Not on file  Social History Narrative   Patient is married Sharon [name]) and lives at home with her family.   Patient has three children.   Patient is a homemaker.   Patient does drink caffeine maybe two cups per week.   Patient is right-handed.   Patient has a BS degree.            Social Drivers of Health   Tobacco Use: Low Risk (02/25/2024)   Patient History    Smoking Tobacco Use: Never    Smokeless Tobacco Use: Never    Passive Exposure: Not on file  Financial Resource Strain: Not on file  Food Insecurity: Not on file  Transportation Needs: Not on file  Physical Activity: Not on file  Stress: Not on file  Social Connections: Not on file  Intimate Partner Violence:  Not on file  Depression (PHQ2-9): Not on file  Alcohol Screen: Not on file  Housing: Not on file  Utilities: Not on file  Health Literacy: Not on file    Family History  Problem Relation Age of Onset   Hyperlipidemia Mother    High Cholesterol Mother    Diabetes Father    Lung cancer Father    Cancer Father    Hyperlipidemia Sister     ROS: no fevers or chills, productive cough, hemoptysis, dysphasia, odynophagia, melena, hematochezia, dysuria, hematuria, rash, seizure activity, orthopnea, PND, pedal edema, claudication. Remaining systems are negative.  Physical Exam:   Blood pressure 90/66, pulse 75, height 5' 8.5 (1.74 m), weight 198 lb (89.8 kg), last menstrual period 03/27/2013, SpO2 94%.  General:  Well developed/well nourished in NAD Skin warm/dry Patient not depressed No peripheral  clubbing Back-normal HEENT-normal/normal eyelids Neck supple/normal carotid upstroke bilaterally; no bruits; no JVD; no thyromegaly chest - CTA/ normal expansion CV - RRR/normal S1 and S2; no murmurs, rubs or gallops;  PMI nondisplaced Abdomen -NT/ND, no HSM, no mass, + bowel sounds, no bruit 2+ femoral pulses, no bruits Ext-no edema, chords, 2+ DP Neuro-grossly nonfocal  EKG Interpretation Date/Time:  Monday February 25 2024 14:08:15 EST Ventricular Rate:  70 PR Interval:  138 QRS Duration:  88 QT Interval:  384 QTC Calculation: 414 R Axis:   50  Text Interpretation: Normal sinus rhythm Normal ECG Confirmed by Pietro Rogue ([id number]) on 02/25/2024 2:18:04 PM    A/P  1 hyperlipidemia-patient has elevated LDL at 144 and LP(a) 145.  She has no other risk factors including no family history in her immediate family, no diabetes mellitus, no hypertension and no history of tobacco abuse.  However given elevated LP(a) I think we should be more aggressive.  Begin Crestor  20 mg daily.  Check lipids and liver in 8 weeks.  Rogue Pietro, MD        [1] No Known Allergies  "

## 2024-02-25 NOTE — Patient Instructions (Signed)
 Medication Instructions:   START ROSUVASTATIN 20 MG ONCE DAILY  *If you need a refill on your cardiac medications before your next appointment, please call your pharmacy*  Lab Work:  Your physician recommends that you return for lab work in: 8 [facility]  If you have labs (blood work) drawn today and your tests are completely normal, you will receive your results only by: MyChart Message (if you have MyChart) OR A paper copy in the mail If you have any lab test that is abnormal or we need to change your treatment, we will call you to review the results.  Follow-Up: At [facility], you and your health needs are our priority.  As part of our continuing mission to provide you with exceptional heart care, our providers are all part of one team.  This team includes your primary Cardiologist (physician) and Advanced Practice Providers or APPs (Physician Assistants and Nurse Practitioners) who all work together to provide you with the care you need, when you need it.  Your next appointment:   12 month(s)  Provider:   REDELL SHALLOW MD

## 2024-02-29 ENCOUNTER — Encounter (INDEPENDENT_AMBULATORY_CARE_PROVIDER_SITE_OTHER): Payer: Self-pay | Admitting: Family Medicine

## 2024-04-30 ENCOUNTER — Ambulatory Visit (INDEPENDENT_AMBULATORY_CARE_PROVIDER_SITE_OTHER): Admitting: Family Medicine
